# Patient Record
Sex: Female | Born: 1935 | Race: White | Hispanic: No | Marital: Married | Smoking: Former smoker
Health system: Southern US, Community
[De-identification: ages and names within clinical notes are randomized; demographics above are authoritative.]

## PROBLEM LIST (undated history)

## (undated) DIAGNOSIS — M810 Age-related osteoporosis without current pathological fracture: Secondary | ICD-10-CM

## (undated) DIAGNOSIS — F0280 Dementia in other diseases classified elsewhere without behavioral disturbance: Secondary | ICD-10-CM

## (undated) DIAGNOSIS — C189 Malignant neoplasm of colon, unspecified: Secondary | ICD-10-CM

## (undated) DIAGNOSIS — G309 Alzheimer's disease, unspecified: Secondary | ICD-10-CM

## (undated) DIAGNOSIS — E785 Hyperlipidemia, unspecified: Secondary | ICD-10-CM

## (undated) HISTORY — PX: COLON SURGERY: SHX602

## (undated) HISTORY — DX: Hyperlipidemia, unspecified: E78.5

## (undated) HISTORY — DX: Alzheimer's disease, unspecified: G30.9

## (undated) HISTORY — DX: Age-related osteoporosis without current pathological fracture: M81.0

## (undated) HISTORY — DX: Malignant neoplasm of colon, unspecified: C18.9

## (undated) HISTORY — DX: Dementia in other diseases classified elsewhere, unspecified severity, without behavioral disturbance, psychotic disturbance, mood disturbance, and anxiety: F02.80

---

## 1994-10-13 DIAGNOSIS — C189 Malignant neoplasm of colon, unspecified: Secondary | ICD-10-CM

## 1994-10-13 HISTORY — DX: Malignant neoplasm of colon, unspecified: C18.9

## 1999-09-11 ENCOUNTER — Other Ambulatory Visit: Admission: RE | Admit: 1999-09-11 | Discharge: 1999-10-02 | Payer: Self-pay | Admitting: Family Medicine

## 2000-10-26 ENCOUNTER — Ambulatory Visit (HOSPITAL_COMMUNITY): Admission: RE | Admit: 2000-10-26 | Discharge: 2000-10-26 | Payer: Self-pay | Admitting: Gastroenterology

## 2002-08-09 ENCOUNTER — Ambulatory Visit (HOSPITAL_COMMUNITY): Admission: RE | Admit: 2002-08-09 | Discharge: 2002-08-09 | Payer: Self-pay | Admitting: Gastroenterology

## 2002-08-09 ENCOUNTER — Encounter: Payer: Self-pay | Admitting: Gastroenterology

## 2004-02-27 ENCOUNTER — Ambulatory Visit (HOSPITAL_COMMUNITY): Admission: RE | Admit: 2004-02-27 | Discharge: 2004-02-27 | Payer: Self-pay | Admitting: Gastroenterology

## 2013-01-05 ENCOUNTER — Other Ambulatory Visit: Payer: Self-pay | Admitting: Nurse Practitioner

## 2013-01-18 ENCOUNTER — Other Ambulatory Visit: Payer: Self-pay | Admitting: Nurse Practitioner

## 2013-01-31 ENCOUNTER — Encounter: Payer: Self-pay | Admitting: Nurse Practitioner

## 2013-01-31 ENCOUNTER — Encounter: Payer: Self-pay | Admitting: *Deleted

## 2013-01-31 ENCOUNTER — Ambulatory Visit (INDEPENDENT_AMBULATORY_CARE_PROVIDER_SITE_OTHER): Payer: Medicare Other | Admitting: Nurse Practitioner

## 2013-01-31 VITALS — BP 123/84 | HR 74 | Temp 97.0°F | Ht 61.0 in | Wt 96.0 lb

## 2013-01-31 DIAGNOSIS — G309 Alzheimer's disease, unspecified: Secondary | ICD-10-CM

## 2013-01-31 DIAGNOSIS — F0280 Dementia in other diseases classified elsewhere without behavioral disturbance: Secondary | ICD-10-CM

## 2013-01-31 DIAGNOSIS — E785 Hyperlipidemia, unspecified: Secondary | ICD-10-CM

## 2013-01-31 DIAGNOSIS — M810 Age-related osteoporosis without current pathological fracture: Secondary | ICD-10-CM

## 2013-01-31 LAB — COMPLETE METABOLIC PANEL WITH GFR
ALT: 22 U/L (ref 0–35)
AST: 32 U/L (ref 0–37)
Albumin: 3.7 g/dL (ref 3.5–5.2)
Alkaline Phosphatase: 57 U/L (ref 39–117)
BUN: 23 mg/dL (ref 6–23)
CO2: 27 mEq/L (ref 19–32)
Calcium: 10.2 mg/dL (ref 8.4–10.5)
Chloride: 107 mEq/L (ref 96–112)
Creat: 1.13 mg/dL — ABNORMAL HIGH (ref 0.50–1.10)
GFR, Est African American: 53 mL/min — ABNORMAL LOW
GFR, Est Non African American: 46 mL/min — ABNORMAL LOW
Glucose, Bld: 93 mg/dL (ref 70–99)
Potassium: 4.4 mEq/L (ref 3.5–5.3)
Sodium: 140 mEq/L (ref 135–145)
Total Bilirubin: 0.5 mg/dL (ref 0.3–1.2)
Total Protein: 6.4 g/dL (ref 6.0–8.3)

## 2013-01-31 NOTE — Progress Notes (Signed)
  Subjective:    Patient ID: Kathryn Hamilton, female    DOB: 1931/02/25, 79 y.o.   MRN: [id number]  Hyperlipidemia This is a chronic problem. The current episode started more than 1 year ago. The problem is controlled. Recent lipid tests were reviewed and are normal. There are no known factors aggravating her hyperlipidemia. Pertinent negatives include no focal sensory loss, focal weakness, leg pain or myalgias. Current antihyperlipidemic treatment includes statins. The current treatment provides significant improvement of lipids. There are no compliance problems.  Risk factors for coronary artery disease include post-menopausal.  Alzhiemers Patient seems to be worsening, but husband is able to deal with her well. He stays with her 24/7.Namenda helps Osteoporosis Calcium and vit d oding well no c/o back pain    Review of Systems  Musculoskeletal: Negative for myalgias.  Neurological: Negative for focal weakness.  All other systems reviewed and are negative.       Objective:   Physical Exam  Constitutional: She is oriented to person, place, and time. She appears well-developed and well-nourished.  HENT:  Nose: Nose normal.  Mouth/Throat: Oropharynx is clear and moist.  Eyes: EOM are normal.  Neck: Trachea normal, normal range of motion and full passive range of motion without pain. Neck supple. No JVD present. Carotid bruit is not present. No thyromegaly present.  Cardiovascular: Normal rate, regular rhythm, normal heart sounds and intact distal pulses.  Exam reveals no gallop and no friction rub.   No murmur heard. Pulmonary/Chest: Effort normal and breath sounds normal.  Abdominal: Soft. Bowel sounds are normal. She exhibits no distension and no mass. There is no tenderness.  Musculoskeletal: Normal range of motion.  Lymphadenopathy:    She has no cervical adenopathy.  Neurological: She is alert and oriented to person, place, and time. She has normal reflexes.  Skin: Skin is  warm and dry.  Psychiatric: She has a normal mood and affect. Her behavior is normal. Judgment and thought content normal.    BP 123/84  Pulse 74  Temp(Src) 97 F (36.1 C) (Oral)  Ht 5\' 1"  (1.549 m)  Wt 96 lb (43.545 kg)  BMI 18.15 kg/m2       Assessment & Plan:  1. Hyperlipidemia with target LDL less than 100 Low fat diet and exercise - COMPLETE METABOLIC PANEL WITH GFR - NMR Lipoprofile with Lipids  2. Alzheimer's disease Continue namenda   3. Osteoporosis, unspecified Weight bearing exercises refeuses dexa scan  Contiue all meds F/U in 3 months  Bennie Pierini, FNP

## 2013-01-31 NOTE — Patient Instructions (Signed)
Alzheimer's Disease Caregiver Guide Alzheimer's disease is an illness that affects a person's brain. It causes a person to lose the ability to remember things and make good decisions. As the disease progresses, the person is unable to take care of himself or herself and needs more and more help to do simple tasks. Taking care of someone with Alzheimer's disease can be very challenging and overwhelming.  MEMORY LOSS AND CONFUSION Memory loss and confusion is mild in the beginning stages of the disease. Both of these problems become more severe as the disease progresses. Eventually, the person will not recognize places or even close family members and friends.   Stay calm.  Respond with a short explanation. Long explanations can be overwhelming and confusing.  Avoid corrections that sound like scolding.  Try not to take it personally, even if the person forgets your name. BEHAVIOR CHANGES Behavior changes are part of the disease. The person may develop depression, anxiety, anger, hallucinations, or other behavior changes. These changes can come on suddenly and may be in response to pain, infection, changes in the environment (temperature, noise), overstimulation, or feeling lost or scared.   Try not to take behavior changes personally.  Remain calm and patient.  Do not argue or try to convince the person about a specific point. This will only make him or her more agitated.  Know that the behavior changes are part of the disease process and try to work through it. TIPS TO REDUCE FRUSTRATION  Schedule wisely by making appointments and doing daily tasks, like bathing and dressing, when the person is at his or her best.  Take your time. Simple tasks may take a lot longer, so be sure to allow for plenty of time.  Limit choices. Too many choices can be overwhelming and stressful for the person.  Involve the person in what you are doing.  Stick to a routine.  Avoid new or crowded  situations, if possible.  Use simple words, short sentences, and a calm voice. Only give 1 direction at a time.  Buy clothes and shoes that are easy to put on and take off.  Let people help if they offer. HOME SAFETY Keeping the home safe is very important to reduce the risk of falls and injuries.   Keep floors clear of clutter. Remove rugs, magazine racks, and floor lamps.  Keep hallways well lit.  Put a handrail and nonslip mat in the bathtub or shower.  Put childproof locks on cabinets with dangerous items, such as medicine, alcohol, guns, toxic cleaning items, sharp tools or utensils, matches, or lighters.  Place locks on doors where the person cannot easily see or reach them. This helps ensure that the person cannot wander out of the house and get lost.  Be prepared for emergencies. Keep a list of emergency phone numbers and addresses in a convenient area. PLANS FOR THE FUTURE  Do not put off talking about finances.  Talk about money management. People with Alzheimer's disease have trouble managing their money as the disease gets worse.  Get help from professional advisors regarding financial and legal matters.  Do not put off talking about future care.  Choose a power of attorney. This is someone who can make decisions for the person with Alzheimer's disease when he or she is no longer able to do so.  Talk about driving and when it is the right time to stop. The person's doctor can help give advice on this matter.  Talk about the person's   living situation. If he or she lives alone, you need to make sure he or she is safe. Some people need extra help at home, and others need more care at a nursing home or care center. SUPPORT GROUPS Joining a support group can be very helpful for caregivers of people with Alzheimer's disease. Some advantages to being part of a support group include:   Getting strategies to manage stress.  Sharing experiences with others.  Receiving  emotional comfort and support.  Learning new caregiving skills as the disease progresses.  Knowing what community resources are available and taking advantage of them. SEEK MEDICAL CARE IF:  The person has a fever.  The person has a sudden change in behavior that does not improve with calming strategies.  The person is unable to manage in his or her current living situation.  The person threatens you or anyone else, including himself or herself.  You are no longer able to care for the person. Document Released: 06/10/2004 Document Revised: 03/30/2012 Document Reviewed: 11/05/2011 [facility] Patient Information 2013 [place], [place].

## 2013-02-01 LAB — NMR LIPOPROFILE WITH LIPIDS
Cholesterol, Total: 159 mg/dL (ref <200)
HDL Particle Number: 35.5 umol/L (ref >=30.5)
HDL Size: 10.7 nm (ref >=9.2)
HDL-C: 75 mg/dL (ref >=40)
LDL (calc): 74 mg/dL (ref <100)
LDL Particle Number: 439 nmol/L (ref <1000)
LDL Size: 21.1 nm (ref >20.5)
LP-IR Score: <25 (ref <=45)
Large HDL-P: 17.7 umol/L (ref >=4.8)
Large VLDL-P: 1 nmol/L (ref <=2.7)
Small LDL Particle Number: <90 nmol/L (ref <=527)
Triglycerides: 49 mg/dL (ref <150)
VLDL Size: 49 nm — ABNORMAL HIGH (ref <=46.6)

## 2013-02-02 ENCOUNTER — Other Ambulatory Visit: Payer: Self-pay | Admitting: Nurse Practitioner

## 2013-02-02 NOTE — Telephone Encounter (Signed)
Last labs 4/13. ntbs

## 2013-02-05 ENCOUNTER — Other Ambulatory Visit: Payer: Self-pay | Admitting: Nurse Practitioner

## 2013-02-07 NOTE — Telephone Encounter (Signed)
LAST LABS 4/13. LAST LABS 10/13

## 2013-02-09 ENCOUNTER — Other Ambulatory Visit: Payer: Self-pay | Admitting: Nurse Practitioner

## 2013-02-10 NOTE — Telephone Encounter (Signed)
LAT OV 10/13

## 2013-03-02 ENCOUNTER — Other Ambulatory Visit: Payer: Self-pay | Admitting: Nurse Practitioner

## 2013-04-19 ENCOUNTER — Other Ambulatory Visit: Payer: Self-pay | Admitting: Nurse Practitioner

## 2013-05-12 ENCOUNTER — Other Ambulatory Visit: Payer: Self-pay | Admitting: Nurse Practitioner

## 2013-06-02 ENCOUNTER — Other Ambulatory Visit: Payer: Self-pay | Admitting: Nurse Practitioner

## 2013-06-14 ENCOUNTER — Other Ambulatory Visit: Payer: Self-pay | Admitting: Nurse Practitioner

## 2013-08-01 ENCOUNTER — Ambulatory Visit (INDEPENDENT_AMBULATORY_CARE_PROVIDER_SITE_OTHER): Payer: Medicare Other

## 2013-08-01 DIAGNOSIS — Z23 Encounter for immunization: Secondary | ICD-10-CM

## 2013-08-02 ENCOUNTER — Other Ambulatory Visit: Payer: Self-pay | Admitting: Family Medicine

## 2013-08-10 ENCOUNTER — Encounter: Payer: Self-pay | Admitting: Nurse Practitioner

## 2013-08-10 ENCOUNTER — Encounter (INDEPENDENT_AMBULATORY_CARE_PROVIDER_SITE_OTHER): Payer: Self-pay

## 2013-08-10 ENCOUNTER — Ambulatory Visit (INDEPENDENT_AMBULATORY_CARE_PROVIDER_SITE_OTHER): Payer: Medicare Other | Admitting: Nurse Practitioner

## 2013-08-10 VITALS — BP 110/55 | HR 91 | Temp 97.0°F | Ht 60.0 in | Wt 98.0 lb

## 2013-08-10 DIAGNOSIS — F0280 Dementia in other diseases classified elsewhere without behavioral disturbance: Secondary | ICD-10-CM

## 2013-08-10 DIAGNOSIS — M810 Age-related osteoporosis without current pathological fracture: Secondary | ICD-10-CM

## 2013-08-10 DIAGNOSIS — E785 Hyperlipidemia, unspecified: Secondary | ICD-10-CM

## 2013-08-10 NOTE — Progress Notes (Signed)
  Subjective:    Patient ID: Kathryn Hamilton, female    DOB: December 20, 1930, 79 y.o.   MRN: [id number]  Hyperlipidemia This is a chronic problem. The current episode started more than 1 year ago. The problem is controlled. Recent lipid tests were reviewed and are normal. There are no known factors aggravating her hyperlipidemia. Pertinent negatives include no focal sensory loss, focal weakness, leg pain or myalgias. Current antihyperlipidemic treatment includes statins. The current treatment provides significant improvement of lipids. There are no compliance problems.  Risk factors for coronary artery disease include post-menopausal.  Alzhiemers Patient seems to be worsening, but husband is able to deal with her well. He stays with her 24/7.Namenda helps Osteoporosis Calcium and vit d oding well no c/o back pain    Review of Systems  Musculoskeletal: Negative for myalgias.  Neurological: Negative for focal weakness.  All other systems reviewed and are negative.       Objective:   Physical Exam  Constitutional: She is oriented to person, place, and time. She appears well-developed and well-nourished.  HENT:  Nose: Nose normal.  Mouth/Throat: Oropharynx is clear and moist.  Eyes: EOM are normal.  Neck: Trachea normal, normal range of motion and full passive range of motion without pain. Neck supple. No JVD present. Carotid bruit is not present. No thyromegaly present.  Cardiovascular: Normal rate, regular rhythm, normal heart sounds and intact distal pulses.  Exam reveals no gallop and no friction rub.   No murmur heard. Pulmonary/Chest: Effort normal and breath sounds normal.  Abdominal: Soft. Bowel sounds are normal. She exhibits no distension and no mass. There is no tenderness.  Musculoskeletal: Normal range of motion.  Lymphadenopathy:    She has no cervical adenopathy.  Neurological: She is alert and oriented to person, place, and time. She has normal reflexes.  Skin: Skin is  warm and dry.  Psychiatric: She has a normal mood and affect. Her behavior is normal. Judgment and thought content normal.    BP 110/55  Pulse 91  Temp(Src) 97 F (36.1 C) (Oral)  Ht 5' (1.524 m)  Wt 98 lb (44.453 kg)  BMI 19.14 kg/m2      Assessment & Plan:   1. Hyperlipidemia with target LDL less than 100   2. Osteoporosis, unspecified   3. Alzheimer's disease    Orders Placed This Encounter  Procedures  . CMP14+EGFR  . NMR, lipoprofile    Continue all meds Labs pending Diet and exercise encouraged Health maintenance reviewed Follow up in 3 months  Mary-Margaret Daphine Deutscher, FNP

## 2013-08-10 NOTE — Patient Instructions (Signed)

## 2013-08-12 LAB — CMP14+EGFR
ALT: 19 IU/L (ref 0–32)
Albumin/Globulin Ratio: 1.7 (ref 1.1–2.5)
Albumin: 3.8 g/dL (ref 3.5–4.8)
BUN: 24 mg/dL (ref 8–27)
Calcium: 10.3 mg/dL — ABNORMAL HIGH (ref 8.6–10.2)
Creatinine, Ser: 1.22 mg/dL — ABNORMAL HIGH (ref 0.57–1.00)
GFR calc Af Amer: 49 mL/min/{1.73_m2} — ABNORMAL LOW (ref >59)
GFR calc non Af Amer: 42 mL/min/{1.73_m2} — ABNORMAL LOW (ref >59)
Glucose: 86 mg/dL (ref 65–99)
Potassium: 5.2 mmol/L (ref 3.5–5.2)
Total Bilirubin: 0.5 mg/dL (ref 0.0–1.2)
Total Protein: 6.1 g/dL (ref 6.0–8.5)

## 2013-08-12 LAB — NMR, LIPOPROFILE
Cholesterol: 145 mg/dL (ref <200)
HDL Cholesterol by NMR: 78 mg/dL (ref >=40)
LDL Size: 21.2 nm (ref >20.5)
Small LDL Particle Number: <90 nmol/L (ref <=527)
Triglycerides by NMR: 67 mg/dL (ref <150)

## 2013-08-15 ENCOUNTER — Other Ambulatory Visit: Payer: Self-pay | Admitting: Nurse Practitioner

## 2013-11-02 ENCOUNTER — Other Ambulatory Visit: Payer: Self-pay | Admitting: Nurse Practitioner

## 2013-11-13 ENCOUNTER — Emergency Department (HOSPITAL_COMMUNITY)
Admission: EM | Admit: 2013-11-13 | Discharge: 2013-11-13 | Disposition: A | Discharge status: Home / Self Care | Payer: Medicare Other | Attending: Emergency Medicine | Admitting: Emergency Medicine

## 2013-11-13 ENCOUNTER — Encounter (HOSPITAL_COMMUNITY): Payer: Self-pay | Admitting: Emergency Medicine

## 2013-11-13 ENCOUNTER — Emergency Department (HOSPITAL_COMMUNITY): Payer: Medicare Other

## 2013-11-13 DIAGNOSIS — Z87891 Personal history of nicotine dependence: Secondary | ICD-10-CM | POA: Insufficient documentation

## 2013-11-13 DIAGNOSIS — Y929 Unspecified place or not applicable: Secondary | ICD-10-CM | POA: Insufficient documentation

## 2013-11-13 DIAGNOSIS — E785 Hyperlipidemia, unspecified: Secondary | ICD-10-CM | POA: Insufficient documentation

## 2013-11-13 DIAGNOSIS — F0280 Dementia in other diseases classified elsewhere without behavioral disturbance: Secondary | ICD-10-CM | POA: Insufficient documentation

## 2013-11-13 DIAGNOSIS — G309 Alzheimer's disease, unspecified: Secondary | ICD-10-CM | POA: Insufficient documentation

## 2013-11-13 DIAGNOSIS — Z79899 Other long term (current) drug therapy: Secondary | ICD-10-CM | POA: Insufficient documentation

## 2013-11-13 DIAGNOSIS — S0101XA Laceration without foreign body of scalp, initial encounter: Secondary | ICD-10-CM

## 2013-11-13 DIAGNOSIS — Z8739 Personal history of other diseases of the musculoskeletal system and connective tissue: Secondary | ICD-10-CM | POA: Insufficient documentation

## 2013-11-13 DIAGNOSIS — R296 Repeated falls: Secondary | ICD-10-CM | POA: Insufficient documentation

## 2013-11-13 DIAGNOSIS — Z7982 Long term (current) use of aspirin: Secondary | ICD-10-CM | POA: Insufficient documentation

## 2013-11-13 DIAGNOSIS — S0100XA Unspecified open wound of scalp, initial encounter: Secondary | ICD-10-CM | POA: Insufficient documentation

## 2013-11-13 DIAGNOSIS — Y939 Activity, unspecified: Secondary | ICD-10-CM | POA: Insufficient documentation

## 2013-11-13 DIAGNOSIS — Z85038 Personal history of other malignant neoplasm of large intestine: Secondary | ICD-10-CM | POA: Insufficient documentation

## 2013-11-13 DIAGNOSIS — W19XXXA Unspecified fall, initial encounter: Secondary | ICD-10-CM

## 2013-11-13 MED ORDER — BACITRACIN ZINC 500 UNIT/GM EX OINT
TOPICAL_OINTMENT | CUTANEOUS | Status: AC
  Administered 2013-11-13: 1 via TOPICAL
  Filled 2013-11-13: qty 0.9

## 2013-11-13 MED ORDER — BACITRACIN ZINC 500 UNIT/GM EX OINT
TOPICAL_OINTMENT | Freq: Once | CUTANEOUS | Status: AC
  Administered 2013-11-13: 1 via TOPICAL

## 2013-11-13 NOTE — ED Provider Notes (Signed)
CSN: [id number]     Arrival date & time 11/13/13  0909 History   This chart was scribed for Carmin Muskrat, MD, by Neta Ehlers, ED Scribe. This patient was seen in room APA18/APA18 and the patient's care was started at 9:21 AM. First MD Initiated Contact with Patient 11/13/13 [phone]     Chief Complaint  Patient presents with  . Fall   Level Five Caveat: Alzheimer's Disease  The history is provided by the patient, the EMS personnel, the spouse and a relative. The history is limited by the condition of the patient. No language interpreter was used.   HPI Comments: Kathryn MALLICOAT is a 80 y.o. female, with a h/o osteoporosis and Alzheimer's disease, who presents to the Emergency Department via EMS complaining of a fall which occurred three hours ago. The pt has a laceration to the back of her head; the bleeding is currently controlled. She could not recall how she fell, and she denies any current pain. The pt's son reports his mother did not have a LOC and was ambulatory post fall. The pt takes ASA daily. Ms. Dorval is a former smoker.   Past Medical History  Diagnosis Date  . Colon cancer   . Osteoporosis   . Alzheimer disease   . HLD (hyperlipidemia)    Past Surgical History  Procedure Laterality Date  . Colon surgery     Family History  Problem Relation Age of Onset  . Heart disease Father    History  Substance Use Topics  . Smoking status: Former Smoker    Quit date: 01/31/1973  . Smokeless tobacco: Not on file  . Alcohol Use: No   No OB history provided.   Review of Systems  Unable to perform ROS: Dementia    Allergies  Review of patient's allergies indicates no known allergies.  Home Medications   Current Outpatient Rx  Name  Route  Sig  Dispense  Refill  . aspirin 81 MG tablet   Oral   Take 81 mg by mouth daily.         Marland Kitchen atorvastatin (LIPITOR) 40 MG tablet      TAKE 1 TABLET DAILY   30 tablet   5   . Calcium Carbonate (CALCIUM 600 PO)   Oral  Take by mouth.         . Cholecalciferol (VITAMIN D-3) 1000 UNITS CAPS   Oral   Take by mouth.         Marland Kitchen NAMENDA 10 MG tablet      TAKE (1) TABLET TWICE A DAY.   60 tablet   0   . niacin (NIASPAN) 500 MG CR tablet      TAKE ONE TABLET AT BEDTIME   30 tablet   5    Triage Vitals: BP 133/75  Pulse 95  Temp(Src) 97.7 F (36.5 C) (Oral)  Resp 16  Ht 5\' 3"  (1.6 m)  Wt 98 lb (44.453 kg)  BMI 17.36 kg/m2  SpO2 100%  Physical Exam  Nursing note and vitals reviewed. Constitutional: She is oriented to person, place, and time. She appears well-developed and well-nourished. No distress.  HENT:  Head: Normocephalic and atraumatic.  Eyes: Conjunctivae and EOM are normal.  Cardiovascular: Normal rate and regular rhythm.   Pulmonary/Chest: Effort normal and breath sounds normal. No stridor. No respiratory distress.  Abdominal: She exhibits no distension.  Musculoskeletal: She exhibits no edema.  Neurological: She is alert and oriented to person, place, and time. No cranial  nerve deficit.  Good nervous system tone.   Skin: Skin is warm and dry.  6 cm laceration to right center occiput.   Psychiatric: She has a normal mood and affect.    ED Course  LACERATION REPAIR Date/Time: 11/13/2013 10:24 AM Performed by: Carmin Muskrat Authorized by: Carmin Muskrat Consent: Verbal consent obtained. The procedure was performed in an emergent situation. Risks and benefits: risks, benefits and alternatives were discussed Consent given by: patient and family. Required items: required blood products, implants, devices, and special equipment available Patient identity confirmed: verbally with patient and hospital-assigned identification number Time out: Immediately prior to procedure a "time out" was called to verify the correct patient, procedure, equipment, support staff and site/side marked as required. Body area: head/neck Location details: scalp Laceration length: 6 cm Tendon  involvement: none Nerve involvement: none Vascular damage: no Irrigation solution: saline Amount of cleaning: standard Debridement: none Degree of undermining: none Skin closure: staples Number of sutures: 5 Approximation: close Approximation difficulty: simple Dressing: antibiotic ointment Patient tolerance: Patient tolerated the procedure well with no immediate complications.   (including critical care time)   COORDINATION OF CARE:  9:29 AM- Discussed treatment plan with patient, which includes a CT scan and staples, and the patient agreed to the plan.   10:06 AM- Rechecked pt. Placed staples.    Labs Review Labs Reviewed - No data to display Imaging Review No results found.  EKG Interpretation   None       MDM   I personally performed the services described in this documentation, which was scribed in my presence. The recorded information has been reviewed and is accurate.   This patient with dementia presents after a fall with head laceration.  Given the patient's cognitive issues, the unknown aspect of the fall, patient had CT scans performed.  These were reassuring.  Patient was hemodynamically throughout her ED course, had laceration repair as above, and was appropriate for discharge with outpatient followup.   Carmin Muskrat, MD 11/13/13 1026

## 2013-11-13 NOTE — ED Notes (Signed)
Pt removed from [place] by EDP. Pt still has c-collar on.

## 2013-11-13 NOTE — ED Notes (Signed)
Per EMS, pt fell from standing position this am. Pt has history of dementia. EMS reports has moderate laceration noted to right posterior side of head. EMS reports bleeding controlled on scene. No active bleeding noted at time of arrival to ED. Pt fully immobilized and on LSB. nad noted.

## 2013-11-13 NOTE — ED Notes (Signed)
Site cleaned with sterile water. EDP placed staples after site cleaning. Bacitracin ointment applied to site by EDP. Pt tolerated well.

## 2013-11-21 ENCOUNTER — Ambulatory Visit (INDEPENDENT_AMBULATORY_CARE_PROVIDER_SITE_OTHER): Payer: Medicare Other | Admitting: Family Medicine

## 2013-11-21 ENCOUNTER — Encounter: Payer: Self-pay | Admitting: Family Medicine

## 2013-11-21 VITALS — BP 138/58 | HR 95 | Temp 97.0°F | Wt 102.8 lb

## 2013-11-21 DIAGNOSIS — S0100XA Unspecified open wound of scalp, initial encounter: Secondary | ICD-10-CM

## 2013-11-21 DIAGNOSIS — S0101XA Laceration without foreign body of scalp, initial encounter: Secondary | ICD-10-CM

## 2013-11-21 NOTE — Progress Notes (Signed)
   Subjective:    Patient ID: Kathryn Hamilton, female    DOB: [birth year], 80 y.o.   MRN: [id number]  HPI  This 80 y.o. female presents for evaluation of laceration to scalp which happened over a week ago and she needs staples to be removed.  Review of Systems No chest pain, SOB, HA, dizziness, vision change, N/V, diarrhea, constipation, dysuria, urinary urgency or frequency, myalgias, arthralgias or rash.     Objective:   Physical Exam  Vital signs noted  Well developed well nourished female.  HEENT - Head with laceration to right parietal region which are well approximated with staples.      Assessment & Plan:  Laceration of scalp Remove staples and may apply neosporin ointment to sight.  Lysbeth Penner FNP

## 2013-12-02 ENCOUNTER — Other Ambulatory Visit: Payer: Self-pay | Admitting: Nurse Practitioner

## 2013-12-21 ENCOUNTER — Ambulatory Visit (INDEPENDENT_AMBULATORY_CARE_PROVIDER_SITE_OTHER): Payer: Medicare Other | Admitting: Nurse Practitioner

## 2013-12-21 ENCOUNTER — Encounter: Payer: Self-pay | Admitting: Nurse Practitioner

## 2013-12-21 VITALS — BP 126/70 | HR 99 | Temp 97.1°F | Ht 63.0 in | Wt 103.0 lb

## 2013-12-21 DIAGNOSIS — R7989 Other specified abnormal findings of blood chemistry: Secondary | ICD-10-CM

## 2013-12-21 DIAGNOSIS — E785 Hyperlipidemia, unspecified: Secondary | ICD-10-CM

## 2013-12-21 DIAGNOSIS — R7309 Other abnormal glucose: Secondary | ICD-10-CM

## 2013-12-21 NOTE — Patient Instructions (Signed)

## 2013-12-21 NOTE — Progress Notes (Addendum)
  Subjective:    Patient ID: Kathryn Hamilton, female    DOB: [birth year], 80 y.o.   MRN: [id number]  Hyperlipidemia This is a chronic problem. The current episode started more than 1 year ago. The problem is controlled. Recent lipid tests were reviewed and are normal. There are no known factors aggravating her hyperlipidemia. Pertinent negatives include no chest pain, focal sensory loss, focal weakness, leg pain, myalgias or shortness of breath. Current antihyperlipidemic treatment includes statins. The current treatment provides significant improvement of lipids. There are no compliance problems.  Risk factors for coronary artery disease include post-menopausal.  Patient does not exercise.  Alzhiemers Patient seems to be worsening, but husband is able to deal with her well with help from caregiver. Caregiver is present every day except Saturday and Sunday. Requires assistance with dressing for past year. He stays with her 24/7. Husband assumes Namenda helps.  Osteoporosis Calcium and vit d oding well no c/o back pain  Patient's husband states she is eating and drinking well. 3 meals a day with some snacks.   Review of Systems  Respiratory: Negative for shortness of breath.   Cardiovascular: Negative for chest pain.  Musculoskeletal: Negative for arthralgias, back pain and myalgias.  Neurological: Negative for focal weakness.  All other systems reviewed and are negative.       Objective:   Physical Exam  Constitutional: She is oriented to person, place, and time. She appears well-developed and well-nourished.  HENT:  Left Ear: External ear normal.  Nose: Nose normal.  Mouth/Throat: Oropharynx is clear and moist.  R. Cerumen Impaction  Eyes: EOM are normal.  Neck: Trachea normal, normal range of motion and full passive range of motion without pain. Neck supple. No JVD present. Carotid bruit is not present. No thyromegaly present.  Cardiovascular: Normal rate, regular rhythm, normal  heart sounds and intact distal pulses.  Exam reveals no gallop and no friction rub.   No murmur heard. Pulmonary/Chest: Effort normal and breath sounds normal.  Abdominal: Soft. Bowel sounds are normal. She exhibits no distension and no mass. There is no tenderness.  Musculoskeletal: Normal range of motion.  Lymphadenopathy:    She has no cervical adenopathy.  Neurological: She is alert and oriented to person, place, and time. She has normal reflexes.  Skin: Skin is warm and dry.  Psychiatric: She has a normal mood and affect. Her behavior is normal. Judgment and thought content normal.  BP 126/70  Pulse 99  Temp(Src) 97.1 F (36.2 C) (Oral)  Ht $R'5\' 3"'kd$  (1.6 m)  Wt 103 lb (46.72 kg)  BMI 18.25 kg/m2       Assessment & Plan:   1. Hyperlipidemia with target LDL less than 100    Orders Placed This Encounter  Procedures  . CMP14+EGFR  . NMR, lipoprofile    Continue all meds Labs pending Diet and exercise encouraged Health maintenance reviewed Follow up in 3 months  [place], FNP

## 2013-12-23 LAB — CMP14+EGFR
A/G RATIO: 1.7 (ref 1.1–2.5)
ALBUMIN: 3.7 g/dL (ref 3.5–4.7)
ALT: 14 IU/L (ref 0–32)
AST: 24 IU/L (ref 0–40)
Alkaline Phosphatase: 71 IU/L (ref 39–117)
BUN/Creatinine Ratio: 19 (ref 11–26)
BUN: 25 mg/dL (ref 8–27)
CO2: 22 mmol/L (ref 18–29)
Calcium: 9.7 mg/dL (ref 8.7–10.3)
Chloride: 109 mmol/L — ABNORMAL HIGH (ref 97–108)
Creatinine, Ser: 1.29 mg/dL — ABNORMAL HIGH (ref 0.57–1.00)
GFR calc Af Amer: 45 mL/min/{1.73_m2} — ABNORMAL LOW (ref >59)
GFR, EST NON AFRICAN AMERICAN: 39 mL/min/{1.73_m2} — AB (ref >59)
GLOBULIN, TOTAL: 2.2 g/dL (ref 1.5–4.5)
Glucose: 152 mg/dL — ABNORMAL HIGH (ref 65–99)
Potassium: 3.8 mmol/L (ref 3.5–5.2)
Sodium: 147 mmol/L — ABNORMAL HIGH (ref 134–144)
TOTAL PROTEIN: 5.9 g/dL — AB (ref 6.0–8.5)
Total Bilirubin: 0.4 mg/dL (ref 0.0–1.2)

## 2013-12-23 LAB — NMR, LIPOPROFILE
Cholesterol: 137 mg/dL (ref <200)
HDL CHOLESTEROL BY NMR: 75 mg/dL (ref >=40)
HDL PARTICLE NUMBER: 31.4 umol/L (ref >=30.5)
LDL Particle Number: 359 nmol/L (ref <1000)
LDL Size: 21.5 nm (ref >20.5)
LDLC SERPL CALC-MCNC: 48 mg/dL (ref <100)
LP-IR Score: <25 (ref <=45)
TRIGLYCERIDES BY NMR: 71 mg/dL (ref <150)

## 2014-01-09 LAB — POCT GLYCOSYLATED HEMOGLOBIN (HGB A1C): Hemoglobin A1C: 5.2

## 2014-01-09 NOTE — Addendum Note (Signed)
Addended by: Pollyann Kennedy F on: 01/09/2014 04:50 PM   Modules accepted: Orders

## 2014-01-23 ENCOUNTER — Telehealth: Payer: Self-pay | Admitting: Nurse Practitioner

## 2014-01-23 NOTE — Telephone Encounter (Signed)
appt at 11:00 with Ronnald Collum

## 2014-01-24 ENCOUNTER — Ambulatory Visit (INDEPENDENT_AMBULATORY_CARE_PROVIDER_SITE_OTHER): Payer: Medicare Other | Admitting: Nurse Practitioner

## 2014-01-24 ENCOUNTER — Encounter: Payer: Self-pay | Admitting: Nurse Practitioner

## 2014-01-24 VITALS — BP 145/67 | HR 92 | Temp 97.9°F | Ht 63.0 in | Wt 100.0 lb

## 2014-01-24 DIAGNOSIS — M67439 Ganglion, unspecified wrist: Secondary | ICD-10-CM

## 2014-01-24 DIAGNOSIS — M6740 Ganglion, unspecified site: Secondary | ICD-10-CM

## 2014-01-24 DIAGNOSIS — H8109 Meniere's disease, unspecified ear: Secondary | ICD-10-CM

## 2014-01-24 DIAGNOSIS — H81399 Other peripheral vertigo, unspecified ear: Secondary | ICD-10-CM

## 2014-01-24 NOTE — Progress Notes (Signed)
   Subjective:    Patient ID: Kathryn Hamilton, female    DOB: [birth year], 80 y.o.   MRN: [id number]  HPI Patient brought in by husband with a knot on her right hand- noticed it about 1 week ago- no better. - Caregiver says that she has been a little dizzy last week and when she walked she would walk to the left. Not doing it now.    Review of Systems  Respiratory: Negative.   Cardiovascular: Negative.   Neurological: Negative.   All other systems reviewed and are negative.      Objective:   Physical Exam  Constitutional: She is oriented to person, place, and time. She appears well-developed and well-nourished.  Cardiovascular: Normal rate, regular rhythm and normal heart sounds.   Pulmonary/Chest: Effort normal and breath sounds normal.  Musculoskeletal:  Soft nodule at right wrist  Neurological: She is alert and oriented to person, place, and time.  Skin: Skin is warm.   BP 145/67  Pulse 92  Temp(Src) 97.9 F (36.6 C) (Oral)  Ht 5\' 3"  (1.6 m)  Wt 100 lb (45.36 kg)  BMI 17.72 kg/m2        Assessment & Plan:   1. Ganglion cyst of wrist Will watch for now If causes pain will do something about it  2. Labyrinthine vertigo Dramamine OTC if reoccurs RTO prn  Mary-Margaret Hassell Done, FNP

## 2014-01-24 NOTE — Patient Instructions (Signed)
Ganglion Cyst °A ganglion cyst is a noncancerous, fluid-filled lump that occurs near joints or tendons. The ganglion cyst grows out of a joint or the lining of a tendon. It most often develops in the hand or wrist but can also develop in the shoulder, elbow, hip, knee, ankle, or foot. The round or oval ganglion can be pea sized or larger than a grape. Increased activity may enlarge the size of the cyst because more fluid starts to build up.  °CAUSES  °It is not completely known what causes a ganglion cyst to grow. However, it may be related to: °· Inflammation or irritation around the joint. °· An injury. °· Repetitive movements or overuse. °· Arthritis. °SYMPTOMS  °A lump most often appears in the hand or wrist, but can occur in other areas of the body. Generally, the lump is painless without other symptoms. However, sometimes pain can be felt during activity or when pressure is applied to the lump. The lump may even be tender to the touch. Tingling, pain, numbness, or muscle weakness can occur if the ganglion cyst presses on a nerve. Your grip may be weak and you may have less movement in your joints.  °DIAGNOSIS  °Ganglion cysts are most often diagnosed based on a physical exam, noting where the cyst is and how it looks. Your caregiver will feel the lump and may shine a light alongside it. If it is a ganglion, a light often shines through it. Your caregiver may order an X-ray, ultrasound, or MRI to rule out other conditions. °TREATMENT  °Ganglions usually go away on their own without treatment. If pain or other symptoms are involved, treatment may be needed. Treatment is also needed if the ganglion limits your movement or if it gets infected. Treatment options include: °· Wearing a wrist or finger brace or splint. °· Taking anti-inflammatory medicine. °· Draining fluid from the lump with a needle (aspiration). °· Injecting a steroid into the joint. °· Surgery to remove the ganglion cyst and its stalk that is  attached to the joint or tendon. However, ganglion cysts can grow back. °HOME CARE INSTRUCTIONS  °· Do not press on the ganglion, poke it with a needle, or hit it with a heavy object. You may rub the lump gently and often. Sometimes fluid moves out of the cyst. °· Only take medicines as directed by your caregiver. °· Wear your brace or splint as directed by your caregiver. °SEEK MEDICAL CARE IF:  °· Your ganglion becomes larger or more painful. °· You have increased redness, red streaks, or swelling. °· You have pus coming from the lump. °· You have weakness or numbness in the affected area. °MAKE SURE YOU:  °· Understand these instructions. °· Will watch your condition. °· Will get help right away if you are not doing well or get worse. °Document Released: 09/26/2000 Document Revised: 06/23/2012 Document Reviewed: 11/23/2007 °[employer]® Patient Information ©2014 [employer]. ° °

## 2014-01-30 ENCOUNTER — Other Ambulatory Visit: Payer: Self-pay | Admitting: Nurse Practitioner

## 2014-02-03 ENCOUNTER — Other Ambulatory Visit: Payer: Self-pay | Admitting: Nurse Practitioner

## 2014-05-27 ENCOUNTER — Other Ambulatory Visit: Payer: Self-pay | Admitting: Nurse Practitioner

## 2014-06-26 ENCOUNTER — Other Ambulatory Visit: Payer: Self-pay | Admitting: Family Medicine

## 2014-07-03 ENCOUNTER — Other Ambulatory Visit: Payer: Self-pay | Admitting: Family Medicine

## 2014-07-04 NOTE — Telephone Encounter (Signed)
Patient NTBS for follow up and lab work  

## 2014-07-04 NOTE — Telephone Encounter (Signed)
Last lipids 3/15. 

## 2014-07-20 ENCOUNTER — Ambulatory Visit (INDEPENDENT_AMBULATORY_CARE_PROVIDER_SITE_OTHER): Payer: Medicare Other

## 2014-07-20 DIAGNOSIS — Z23 Encounter for immunization: Secondary | ICD-10-CM

## 2014-07-27 ENCOUNTER — Other Ambulatory Visit: Payer: Self-pay | Admitting: Family Medicine

## 2014-07-28 ENCOUNTER — Other Ambulatory Visit: Payer: Self-pay | Admitting: Nurse Practitioner

## 2014-07-28 ENCOUNTER — Other Ambulatory Visit: Payer: Self-pay | Admitting: Family Medicine

## 2014-07-31 ENCOUNTER — Telehealth: Payer: Self-pay | Admitting: Nurse Practitioner

## 2014-07-31 ENCOUNTER — Other Ambulatory Visit: Payer: Self-pay | Admitting: *Deleted

## 2014-07-31 MED ORDER — ATORVASTATIN CALCIUM 40 MG PO TABS: ORAL_TABLET | ORAL | Status: DC

## 2014-07-31 MED ORDER — MEMANTINE HCL 10 MG PO TABS: ORAL_TABLET | ORAL | Status: DC

## 2014-07-31 MED ORDER — NIACIN ER (ANTIHYPERLIPIDEMIC) 500 MG PO TBCR: EXTENDED_RELEASE_TABLET | ORAL | Status: DC

## 2014-07-31 NOTE — Telephone Encounter (Signed)
Sent meds to pharmacy . Left message on patients voicemail

## 2014-08-16 ENCOUNTER — Encounter: Payer: Self-pay | Admitting: Nurse Practitioner

## 2014-08-16 ENCOUNTER — Encounter (INDEPENDENT_AMBULATORY_CARE_PROVIDER_SITE_OTHER): Payer: Self-pay

## 2014-08-16 ENCOUNTER — Ambulatory Visit (INDEPENDENT_AMBULATORY_CARE_PROVIDER_SITE_OTHER): Payer: Medicare Other | Admitting: Nurse Practitioner

## 2014-08-16 VITALS — BP 138/67 | HR 108 | Temp 97.0°F | Ht 63.0 in | Wt 100.0 lb

## 2014-08-16 DIAGNOSIS — E785 Hyperlipidemia, unspecified: Secondary | ICD-10-CM

## 2014-08-16 DIAGNOSIS — F0280 Dementia in other diseases classified elsewhere without behavioral disturbance: Secondary | ICD-10-CM

## 2014-08-16 DIAGNOSIS — G309 Alzheimer's disease, unspecified: Secondary | ICD-10-CM

## 2014-08-16 DIAGNOSIS — M810 Age-related osteoporosis without current pathological fracture: Secondary | ICD-10-CM

## 2014-08-16 DIAGNOSIS — Z23 Encounter for immunization: Secondary | ICD-10-CM

## 2014-08-16 NOTE — Addendum Note (Signed)
Addended by: Waverly Ferrari on: 08/16/2014 03:17 PM   Modules accepted: Orders

## 2014-08-16 NOTE — Patient Instructions (Signed)

## 2014-08-16 NOTE — Progress Notes (Signed)
  Subjective:    Patient ID: Kathryn Hamilton, female    DOB: 1931-01-14, 80 y.o.   MRN: [id number]  Patient is here for chronic disease follow up. No acute complaint.   Hyperlipidemia This is a chronic problem. The current episode started more than 1 year ago. The problem is controlled. Recent lipid tests were reviewed and are normal. There are no known factors aggravating her hyperlipidemia. Pertinent negatives include no chest pain, focal sensory loss, focal weakness, leg pain, myalgias or shortness of breath. Current antihyperlipidemic treatment includes statins. The current treatment provides significant improvement of lipids. There are no compliance problems.  Risk factors for coronary artery disease include post-menopausal.  Patient does not exercise.  Alzhiemers Patient seems to be worsening, but husband is able to deal with her well with help from caregiver. Caregiver is present every day except Saturday and Sunday. Requires assistance with dressing for past year. He stays with her 24/7. Husband assumes Namenda helps.  Osteoporosis Calcium and vit d oding well no c/o back pain  Patient's husband states she is eating and drinking well. 3 meals a day with some snacks.   Review of Systems  Respiratory: Negative for shortness of breath.   Cardiovascular: Negative for chest pain.  Musculoskeletal: Negative for myalgias, back pain and arthralgias.  Neurological: Negative for focal weakness.  All other systems reviewed and are negative.      Objective:   Physical Exam  Constitutional: She is oriented to person, place, and time. She appears well-developed and well-nourished.  HENT:  Left Ear: External ear normal.  Nose: Nose normal.  Mouth/Throat: Oropharynx is clear and moist.  R. Cerumen Impaction  Eyes: EOM are normal.  Neck: Trachea normal, normal range of motion and full passive range of motion without pain. Neck supple. No JVD present. Carotid bruit is not present. No  thyromegaly present.  Cardiovascular: Normal rate, regular rhythm, normal heart sounds and intact distal pulses.  Exam reveals no gallop and no friction rub.   No murmur heard. Pulmonary/Chest: Effort normal and breath sounds normal.  Abdominal: Soft. Bowel sounds are normal. She exhibits no distension and no mass. There is no tenderness.  Musculoskeletal: Normal range of motion.  Lymphadenopathy:    She has no cervical adenopathy.  Neurological: She is alert and oriented to person, place, and time. She has normal reflexes.  Skin: Skin is warm and dry.  Psychiatric: She has a normal mood and affect. Her behavior is normal. Judgment and thought content normal.    BP 138/67 mmHg  Pulse 108  Temp(Src) 97 F (36.1 C) (Oral)  Ht $R'5\' 3"'hL$  (1.6 m)  Wt 100 lb (45.36 kg)  BMI 17.72 kg/m2      Assessment & Plan:   1. Osteoporosis Weight bearing exercises  2. Alzheimer's disease  3. Hyperlipidemia with target LDL less than 100 Due to weight loss gonnig to encourage her to snack more often - CMP14+EGFR - NMR, lipoprofile    Labs pending Health maintenance reviewed Diet and exercise encouraged Continue all meds Follow up  In 3 month   [place], FNP

## 2014-08-17 LAB — NMR, LIPOPROFILE
Cholesterol: 153 mg/dL (ref 100–199)
HDL Cholesterol by NMR: 76 mg/dL (ref >39)
HDL PARTICLE NUMBER: 33.5 umol/L (ref >=30.5)
LDL Particle Number: 537 nmol/L (ref <1000)
LDL Size: 22 nm (ref >20.5)
LDL-C: 62 mg/dL (ref 0–99)
LP-IR Score: 36 (ref <=45)
TRIGLYCERIDES BY NMR: 76 mg/dL (ref 0–149)

## 2014-08-17 LAB — CMP14+EGFR
A/G RATIO: 1.5 (ref 1.1–2.5)
ALT: 10 IU/L (ref 0–32)
AST: 19 IU/L (ref 0–40)
Albumin: 4 g/dL (ref 3.5–4.7)
Alkaline Phosphatase: 78 IU/L (ref 39–117)
BILIRUBIN TOTAL: 0.4 mg/dL (ref 0.0–1.2)
BUN/Creatinine Ratio: 20 (ref 11–26)
BUN: 26 mg/dL (ref 8–27)
CALCIUM: 10.4 mg/dL — AB (ref 8.7–10.3)
CO2: 24 mmol/L (ref 18–29)
Chloride: 103 mmol/L (ref 97–108)
Creatinine, Ser: 1.33 mg/dL — ABNORMAL HIGH (ref 0.57–1.00)
GFR, EST AFRICAN AMERICAN: 44 mL/min/{1.73_m2} — AB (ref >59)
GFR, EST NON AFRICAN AMERICAN: 38 mL/min/{1.73_m2} — AB (ref >59)
Globulin, Total: 2.6 g/dL (ref 1.5–4.5)
Glucose: 117 mg/dL — ABNORMAL HIGH (ref 65–99)
Potassium: 4.6 mmol/L (ref 3.5–5.2)
SODIUM: 144 mmol/L (ref 134–144)
Total Protein: 6.6 g/dL (ref 6.0–8.5)

## 2014-09-04 ENCOUNTER — Other Ambulatory Visit: Payer: Self-pay | Admitting: Nurse Practitioner

## 2014-09-04 NOTE — Telephone Encounter (Signed)
Last seen 08/16/14 with MMM,last ordered 07/31/14 one month supply with no refills.

## 2014-09-19 ENCOUNTER — Other Ambulatory Visit: Payer: Self-pay | Admitting: Nurse Practitioner

## 2014-10-26 ENCOUNTER — Ambulatory Visit (INDEPENDENT_AMBULATORY_CARE_PROVIDER_SITE_OTHER): Payer: Medicare Other | Admitting: Family Medicine

## 2014-10-26 VITALS — BP 151/77 | HR 105 | Ht 63.0 in | Wt 99.0 lb

## 2014-10-26 DIAGNOSIS — S0181XA Laceration without foreign body of other part of head, initial encounter: Secondary | ICD-10-CM

## 2014-10-26 DIAGNOSIS — T148 Other injury of unspecified body region: Secondary | ICD-10-CM

## 2014-10-26 DIAGNOSIS — IMO0002 Reserved for concepts with insufficient information to code with codable children: Secondary | ICD-10-CM

## 2014-10-26 NOTE — Progress Notes (Signed)
   Subjective:    Patient ID: Kathryn Hamilton, female    DOB: 1931-09-23, 81 y.o.   MRN: [id number]  HPI Patient got out of bed this am and fell and struck her head on the dresser.  She has a laceration. Her tetanus is UTD.  Review of Systems No chest pain, SOB, HA, dizziness, vision change, N/V, diarrhea, constipation, dysuria, urinary urgency or frequency, myalgias, arthralgias or rash.     Objective:   Physical Exam  2.5 cm laceration right forehead.  Laceration is cleansed with betadine and then anesthetized with 1% lidocaine and then approximated with 3  5.0 nylon sutrues and 3 steristrips placed and secure and then bandage applied.      Assessment & Plan:  Laceration Follow up in one week for suture removal.  Lysbeth Penner FNP

## 2014-11-01 ENCOUNTER — Telehealth: Payer: Self-pay | Admitting: Family Medicine

## 2014-11-01 NOTE — Telephone Encounter (Signed)
Sutures can be removed now. She can remove bandage and clean the area with mild soap. Pat dry.  Keep are clean and dry. May leave uncovered.  Unable to reach at the number provided.  Will attempt again at a later time.

## 2014-11-02 NOTE — Telephone Encounter (Signed)
Stp and advised how to clean and remove dressing, pt given appt with Dietrich Pates next week 11/08/14 at 11.

## 2014-11-08 ENCOUNTER — Ambulatory Visit (INDEPENDENT_AMBULATORY_CARE_PROVIDER_SITE_OTHER): Payer: Medicare Other | Admitting: Family Medicine

## 2014-11-08 ENCOUNTER — Encounter: Payer: Self-pay | Admitting: Family Medicine

## 2014-11-08 VITALS — BP 138/72 | HR 117 | Temp 97.4°F | Ht 63.0 in | Wt 100.0 lb

## 2014-11-08 DIAGNOSIS — Z4802 Encounter for removal of sutures: Secondary | ICD-10-CM

## 2014-11-08 DIAGNOSIS — T148 Other injury of unspecified body region: Secondary | ICD-10-CM

## 2014-11-08 NOTE — Progress Notes (Signed)
   Subjective:    Patient ID: Kathryn Hamilton, female    DOB: [birth year], 81 y.o.   MRN: [id number]  HPI Patient is here to have sutures removed from a laceration above her right eye which occurred a week ago.  Review of Systems  Constitutional: Negative for fever.  HENT: Negative for ear pain.   Eyes: Negative for discharge.  Respiratory: Negative for cough.   Cardiovascular: Negative for chest pain.  Gastrointestinal: Negative for abdominal distention.  Endocrine: Negative for polyuria.  Genitourinary: Negative for difficulty urinating.  Musculoskeletal: Negative for gait problem and neck pain.  Skin: Negative for color change and rash.  Neurological: Negative for speech difficulty and headaches.  Psychiatric/Behavioral: Negative for agitation.       Objective:    BP 138/72 mmHg  Pulse 117  Temp(Src) 97.4 F (36.3 C) (Oral)  Ht 5\' 3"  (1.6 m)  Wt 100 lb (45.36 kg)  BMI 17.72 kg/m2 Physical Exam  Skin - #3 sutures removed from well approx laceration above left eye.      Assessment & Plan:     ICD-9-CM ICD-10-CM   1. Visit for suture removal V58.32 Z48.02      Return if symptoms worsen or fail to improve.  Lysbeth Penner FNP

## 2014-11-26 ENCOUNTER — Other Ambulatory Visit: Payer: Self-pay | Admitting: Nurse Practitioner

## 2014-11-30 ENCOUNTER — Other Ambulatory Visit: Payer: Self-pay | Admitting: Nurse Practitioner

## 2015-01-01 ENCOUNTER — Other Ambulatory Visit: Payer: Self-pay | Admitting: Nurse Practitioner

## 2015-02-19 ENCOUNTER — Other Ambulatory Visit: Payer: Self-pay | Admitting: Nurse Practitioner

## 2015-02-26 ENCOUNTER — Other Ambulatory Visit: Payer: Self-pay | Admitting: Nurse Practitioner

## 2015-03-08 ENCOUNTER — Encounter: Payer: Self-pay | Admitting: Nurse Practitioner

## 2015-03-08 ENCOUNTER — Ambulatory Visit (INDEPENDENT_AMBULATORY_CARE_PROVIDER_SITE_OTHER): Payer: Medicare Other | Admitting: Nurse Practitioner

## 2015-03-08 VITALS — BP 116/64 | HR 87 | Temp 97.6°F | Ht 63.0 in | Wt 97.0 lb

## 2015-03-08 DIAGNOSIS — E785 Hyperlipidemia, unspecified: Secondary | ICD-10-CM | POA: Diagnosis not present

## 2015-03-08 DIAGNOSIS — Z681 Body mass index (BMI) 19 or less, adult: Secondary | ICD-10-CM | POA: Diagnosis not present

## 2015-03-08 DIAGNOSIS — G309 Alzheimer's disease, unspecified: Secondary | ICD-10-CM | POA: Diagnosis not present

## 2015-03-08 DIAGNOSIS — F0280 Dementia in other diseases classified elsewhere without behavioral disturbance: Secondary | ICD-10-CM

## 2015-03-08 DIAGNOSIS — M810 Age-related osteoporosis without current pathological fracture: Secondary | ICD-10-CM

## 2015-03-08 MED ORDER — ATORVASTATIN CALCIUM 40 MG PO TABS: 40.0000 mg | ORAL_TABLET | Freq: Every day | ORAL | Status: DC

## 2015-03-08 MED ORDER — MEMANTINE HCL 10 MG PO TABS: ORAL_TABLET | ORAL | Status: DC

## 2015-03-08 MED ORDER — NIACIN ER (ANTIHYPERLIPIDEMIC) 500 MG PO TBCR: 500.0000 mg | EXTENDED_RELEASE_TABLET | Freq: Every day | ORAL | Status: DC

## 2015-03-08 NOTE — Progress Notes (Signed)
Subjective:    Patient ID: Kathryn Hamilton, female    DOB: March 28, 1931, 81 y.o.   MRN: [id number]  Patient is here for chronic disease follow up. No acute complaint.   Hyperlipidemia This is a chronic problem. The current episode started more than 1 year ago. Recent lipid tests were reviewed and are variable. Pertinent negatives include no chest pain, myalgias or shortness of breath. Current antihyperlipidemic treatment includes statins. The current treatment provides moderate improvement of lipids. Compliance problems include adherence to diet and adherence to exercise.  Risk factors for coronary artery disease include dyslipidemia and post-menopausal.  Patient does not exercise.  Alzhiemers Husband says that pateint has gotten much worse since  Last visit. He is going to have to get around the clock care for her because he cannot handle her by hisself. They currently have a  caregiver  That is present every day except Saturday and Sunday. Requires assistance with dressing for past year. He stays with her 24/7. Husband assumes Namenda helps.  Osteoporosis Calcium and vit d oding well no c/o back pain   Review of Systems  Constitutional: Negative.   HENT: Negative.   Respiratory: Negative for shortness of breath.   Cardiovascular: Negative for chest pain.  Musculoskeletal: Negative for myalgias, back pain and arthralgias.  All other systems reviewed and are negative.      Objective:   Physical Exam  Constitutional: She is oriented to person, place, and time. She appears well-developed and well-nourished.  HENT:  Left Ear: External ear normal.  Nose: Nose normal.  Mouth/Throat: Oropharynx is clear and moist.  R. Cerumen Impaction  Eyes: EOM are normal.  Neck: Trachea normal, normal range of motion and full passive range of motion without pain. Neck supple. No JVD present. Carotid bruit is not present. No thyromegaly present.  Cardiovascular: Normal rate, regular rhythm, normal  heart sounds and intact distal pulses.  Exam reveals no gallop and no friction rub.   No murmur heard. Pulmonary/Chest: Effort normal and breath sounds normal.  Abdominal: Soft. Bowel sounds are normal. She exhibits no distension and no mass. There is no tenderness.  Musculoskeletal: Normal range of motion.  Lymphadenopathy:    She has no cervical adenopathy.  Neurological: She is alert and oriented to person, place, and time. She has normal reflexes.  Skin: Skin is warm and dry.  Psychiatric: She has a normal mood and affect. Her behavior is normal. Judgment and thought content normal.    BP 116/64 mmHg  Pulse 87  Temp(Src) 97.6 F (36.4 C) (Oral)  Ht $R'5\' 3"'oD$  (1.6 m)  Wt 97 lb (43.999 kg)  BMI 17.19 kg/m2       Assessment & Plan:   1. Alzheimer's disease - memantine (NAMENDA) 10 MG tablet; TAKE (1) TABLET TWICE A DAY.  Dispense: 60 tablet; Refill: 0  2. Osteoporosis Weight bearing exercises  3. Hyperlipidemia with target LDL less than 100 Low fta diet - atorvastatin (LIPITOR) 40 MG tablet; Take 1 tablet (40 mg total) by mouth daily.  Dispense: 30 tablet; Refill: 0 - niacin (NIASPAN) 500 MG CR tablet; Take 1 tablet (500 mg total) by mouth at bedtime.  Dispense: 30 tablet; Refill: 0 - CMP14+EGFR - NMR, lipoprofile  4. Adult BMI <19 kg/sq m Add ensure to daily diet- at least 2 a day    Labs pending Health maintenance reviewed Diet and exercise encouraged Continue all meds Follow up  In 6 month   [place], FNP

## 2015-03-09 LAB — NMR, LIPOPROFILE
Cholesterol: 126 mg/dL (ref 100–199)
HDL CHOLESTEROL BY NMR: 59 mg/dL (ref >39)
HDL PARTICLE NUMBER: 27.9 umol/L — AB (ref >=30.5)
LDL Particle Number: 448 nmol/L (ref <1000)
LDL SIZE: 22.4 nm (ref >20.5)
LDL-C: 54 mg/dL (ref 0–99)
LP-IR Score: <25 (ref <=45)
Small LDL Particle Number: <90 nmol/L (ref <=527)
Triglycerides by NMR: 66 mg/dL (ref 0–149)

## 2015-03-09 LAB — CMP14+EGFR
A/G RATIO: 1.5 (ref 1.1–2.5)
ALK PHOS: 79 IU/L (ref 39–117)
ALT: 10 IU/L (ref 0–32)
AST: 21 IU/L (ref 0–40)
Albumin: 3.7 g/dL (ref 3.5–4.7)
BUN / CREAT RATIO: 17 (ref 11–26)
BUN: 22 mg/dL (ref 8–27)
Bilirubin Total: 0.8 mg/dL (ref 0.0–1.2)
CALCIUM: 8.4 mg/dL — AB (ref 8.7–10.3)
CO2: 22 mmol/L (ref 18–29)
Chloride: 107 mmol/L (ref 97–108)
Creatinine, Ser: 1.26 mg/dL — ABNORMAL HIGH (ref 0.57–1.00)
GFR calc non Af Amer: 40 mL/min/{1.73_m2} — ABNORMAL LOW (ref >59)
GFR, EST AFRICAN AMERICAN: 46 mL/min/{1.73_m2} — AB (ref >59)
GLUCOSE: 147 mg/dL — AB (ref 65–99)
Globulin, Total: 2.5 g/dL (ref 1.5–4.5)
Potassium: 4.3 mmol/L (ref 3.5–5.2)
Sodium: 144 mmol/L (ref 134–144)
Total Protein: 6.2 g/dL (ref 6.0–8.5)

## 2015-04-23 ENCOUNTER — Other Ambulatory Visit: Payer: Self-pay | Admitting: Nurse Practitioner

## 2015-07-15 ENCOUNTER — Emergency Department (HOSPITAL_COMMUNITY): Payer: Medicare Other

## 2015-07-15 ENCOUNTER — Inpatient Hospital Stay (HOSPITAL_COMMUNITY)
Admission: EM | Admit: 2015-07-15 | Discharge: 2015-07-18 | DRG: 482 | Disposition: A | Discharge status: Skilled Nursing Facility | Payer: Medicare Other | Attending: Internal Medicine | Admitting: Internal Medicine

## 2015-07-15 ENCOUNTER — Inpatient Hospital Stay (HOSPITAL_COMMUNITY): Payer: Medicare Other

## 2015-07-15 ENCOUNTER — Encounter (HOSPITAL_COMMUNITY): Payer: Self-pay | Admitting: *Deleted

## 2015-07-15 DIAGNOSIS — Z7982 Long term (current) use of aspirin: Secondary | ICD-10-CM | POA: Diagnosis not present

## 2015-07-15 DIAGNOSIS — F0280 Dementia in other diseases classified elsewhere without behavioral disturbance: Secondary | ICD-10-CM | POA: Diagnosis present

## 2015-07-15 DIAGNOSIS — S72012A Unspecified intracapsular fracture of left femur, initial encounter for closed fracture: Principal | ICD-10-CM | POA: Diagnosis present

## 2015-07-15 DIAGNOSIS — Y92013 Bedroom of single-family (private) house as the place of occurrence of the external cause: Secondary | ICD-10-CM

## 2015-07-15 DIAGNOSIS — S72002A Fracture of unspecified part of neck of left femur, initial encounter for closed fracture: Secondary | ICD-10-CM | POA: Diagnosis not present

## 2015-07-15 DIAGNOSIS — T148XXA Other injury of unspecified body region, initial encounter: Secondary | ICD-10-CM

## 2015-07-15 DIAGNOSIS — M25552 Pain in left hip: Secondary | ICD-10-CM | POA: Diagnosis present

## 2015-07-15 DIAGNOSIS — G301 Alzheimer's disease with late onset: Secondary | ICD-10-CM | POA: Diagnosis not present

## 2015-07-15 DIAGNOSIS — S72002D Fracture of unspecified part of neck of left femur, subsequent encounter for closed fracture with routine healing: Secondary | ICD-10-CM | POA: Diagnosis not present

## 2015-07-15 DIAGNOSIS — W19XXXD Unspecified fall, subsequent encounter: Secondary | ICD-10-CM | POA: Diagnosis not present

## 2015-07-15 DIAGNOSIS — G309 Alzheimer's disease, unspecified: Secondary | ICD-10-CM | POA: Diagnosis present

## 2015-07-15 DIAGNOSIS — Z8249 Family history of ischemic heart disease and other diseases of the circulatory system: Secondary | ICD-10-CM | POA: Diagnosis not present

## 2015-07-15 DIAGNOSIS — Z87891 Personal history of nicotine dependence: Secondary | ICD-10-CM

## 2015-07-15 DIAGNOSIS — Z85038 Personal history of other malignant neoplasm of large intestine: Secondary | ICD-10-CM

## 2015-07-15 DIAGNOSIS — Z66 Do not resuscitate: Secondary | ICD-10-CM | POA: Diagnosis present

## 2015-07-15 DIAGNOSIS — E785 Hyperlipidemia, unspecified: Secondary | ICD-10-CM | POA: Diagnosis present

## 2015-07-15 DIAGNOSIS — W06XXXA Fall from bed, initial encounter: Secondary | ICD-10-CM | POA: Diagnosis present

## 2015-07-15 DIAGNOSIS — M810 Age-related osteoporosis without current pathological fracture: Secondary | ICD-10-CM | POA: Diagnosis present

## 2015-07-15 DIAGNOSIS — W19XXXA Unspecified fall, initial encounter: Secondary | ICD-10-CM | POA: Insufficient documentation

## 2015-07-15 LAB — CBC WITH DIFFERENTIAL/PLATELET
Basophils Absolute: 0 10*3/uL (ref 0.0–0.1)
Basophils Relative: 0 %
Eosinophils Absolute: 0.1 10*3/uL (ref 0.0–0.7)
Eosinophils Relative: 1 %
HEMATOCRIT: 36.7 % (ref 36.0–46.0)
HEMOGLOBIN: 12 g/dL (ref 12.0–15.0)
LYMPHS ABS: 0.7 10*3/uL (ref 0.7–4.0)
LYMPHS PCT: 10 %
MCH: 30.3 pg (ref 26.0–34.0)
MCHC: 32.7 g/dL (ref 30.0–36.0)
MCV: 92.7 fL (ref 78.0–100.0)
MONOS PCT: 7 %
Monocytes Absolute: 0.5 10*3/uL (ref 0.1–1.0)
NEUTROS ABS: 6.4 10*3/uL (ref 1.7–7.7)
NEUTROS PCT: 82 %
Platelets: 170 10*3/uL (ref 150–400)
RBC: 3.96 MIL/uL (ref 3.87–5.11)
RDW: 15 % (ref 11.5–15.5)
WBC: 7.7 10*3/uL (ref 4.0–10.5)

## 2015-07-15 LAB — BASIC METABOLIC PANEL
Anion gap: 9 (ref 5–15)
BUN: 23 mg/dL — ABNORMAL HIGH (ref 6–20)
CHLORIDE: 107 mmol/L (ref 101–111)
CO2: 25 mmol/L (ref 22–32)
CREATININE: 1.07 mg/dL — AB (ref 0.44–1.00)
Calcium: 9.4 mg/dL (ref 8.9–10.3)
GFR calc non Af Amer: 47 mL/min — ABNORMAL LOW (ref >60)
GFR, EST AFRICAN AMERICAN: 55 mL/min — AB (ref >60)
GLUCOSE: 126 mg/dL — AB (ref 65–99)
Potassium: 4.1 mmol/L (ref 3.5–5.1)
Sodium: 141 mmol/L (ref 135–145)

## 2015-07-15 LAB — SURGICAL PCR SCREEN
MRSA, PCR: NEGATIVE
Staphylococcus aureus: NEGATIVE

## 2015-07-15 LAB — URINALYSIS, ROUTINE W REFLEX MICROSCOPIC
Bilirubin Urine: NEGATIVE
GLUCOSE, UA: NEGATIVE mg/dL
Ketones, ur: NEGATIVE mg/dL
LEUKOCYTES UA: NEGATIVE
Nitrite: NEGATIVE
PH: 6 (ref 5.0–8.0)
PROTEIN: NEGATIVE mg/dL
Specific Gravity, Urine: 1.015 (ref 1.005–1.030)
Urobilinogen, UA: 0.2 mg/dL (ref 0.0–1.0)

## 2015-07-15 LAB — URINE MICROSCOPIC-ADD ON

## 2015-07-15 MED ORDER — HEPARIN SODIUM (PORCINE) 1000 UNIT/ML IJ SOLN: 5000.0000 [IU] | Freq: Once | INTRAMUSCULAR | Status: DC

## 2015-07-15 MED ORDER — ATORVASTATIN CALCIUM 40 MG PO TABS: 40.0000 mg | ORAL_TABLET | Freq: Every day | ORAL | Status: DC

## 2015-07-15 MED ORDER — SODIUM CHLORIDE 0.9 % IV BOLUS (SEPSIS)
500.0000 mL | Freq: Once | INTRAVENOUS | Status: AC
  Administered 2015-07-15: 500 mL via INTRAVENOUS

## 2015-07-15 MED ORDER — INFLUENZA VAC SPLIT QUAD 0.5 ML IM SUSY: 0.5000 mL | PREFILLED_SYRINGE | INTRAMUSCULAR | Status: DC

## 2015-07-15 MED ORDER — ONDANSETRON HCL 4 MG/2ML IJ SOLN: 4.0000 mg | Freq: Four times a day (QID) | INTRAMUSCULAR | Status: DC | PRN

## 2015-07-15 MED ORDER — ONDANSETRON HCL 4 MG PO TABS: 4.0000 mg | ORAL_TABLET | Freq: Four times a day (QID) | ORAL | Status: DC | PRN

## 2015-07-15 MED ORDER — ASPIRIN 81 MG PO CHEW
81.0000 mg | CHEWABLE_TABLET | Freq: Every day | ORAL | Status: DC
  Administered 2015-07-15: 81 mg via ORAL
  Filled 2015-07-15: qty 1

## 2015-07-15 MED ORDER — HEPARIN SODIUM (PORCINE) 5000 UNIT/ML IJ SOLN
5000.0000 [IU] | Freq: Once | INTRAMUSCULAR | Status: AC
  Administered 2015-07-15: 5000 [IU] via SUBCUTANEOUS
  Filled 2015-07-15: qty 1

## 2015-07-15 MED ORDER — MEMANTINE HCL 10 MG PO TABS
10.0000 mg | ORAL_TABLET | Freq: Two times a day (BID) | ORAL | Status: DC
  Administered 2015-07-15: 10 mg via ORAL
  Filled 2015-07-15: qty 1

## 2015-07-15 MED ORDER — ACETAMINOPHEN 650 MG RE SUPP: 650.0000 mg | Freq: Four times a day (QID) | RECTAL | Status: DC | PRN

## 2015-07-15 MED ORDER — ACETAMINOPHEN 325 MG PO TABS: 650.0000 mg | ORAL_TABLET | Freq: Four times a day (QID) | ORAL | Status: DC | PRN

## 2015-07-15 MED ORDER — DEXTROSE-NACL 5-0.9 % IV SOLN
INTRAVENOUS | Status: DC
  Administered 2015-07-15: 17:00:00 via INTRAVENOUS

## 2015-07-15 MED ORDER — OXYCODONE HCL 5 MG PO TABS: 5.0000 mg | ORAL_TABLET | ORAL | Status: DC | PRN

## 2015-07-15 MED ORDER — DOCUSATE SODIUM 100 MG PO CAPS
100.0000 mg | ORAL_CAPSULE | Freq: Two times a day (BID) | ORAL | Status: DC
  Administered 2015-07-15: 100 mg via ORAL
  Filled 2015-07-15: qty 1

## 2015-07-15 MED ORDER — HYDROMORPHONE HCL 1 MG/ML IJ SOLN
0.5000 mg | INTRAMUSCULAR | Status: DC | PRN
  Administered 2015-07-15 – 2015-07-16 (×2): 0.5 mg via INTRAVENOUS
  Filled 2015-07-15 (×2): qty 1

## 2015-07-15 MED ORDER — HEPARIN SODIUM (PORCINE) 5000 UNIT/ML IJ SOLN: 5000.0000 [IU] | Freq: Three times a day (TID) | INTRAMUSCULAR | Status: DC

## 2015-07-15 NOTE — H&P (Signed)
[facility] History and Physical  Kathryn Hamilton WPY:[id number] DOB: Jan 16, 1931    PCP:   Chevis Pretty, FNP   Chief Complaint:  Left hip fracture.   HPI: Kathryn Hamilton is an 81 y.o. female with hx of advanced dementia, osteoporosis, HLD, lives at home with caretaker and her husband, found on the floor, suspicious she may have "rolled out of bed" or fallen, presented to the ER where she was found to have an impacted left femoral neck Fx.  Her husband, a retired Forensic psychologist, has POA/HCP, and indicated that she is a DNR.  He also expressed wishes and gave verbal permission that she undergoes ORIF.  Dr Aline Brochure of orthopedic surgery was consulted by EDP and was aware of her admission.  Hospitalist was asked to admit her for same. She has had no CAD, HTN, DM, and had distant history of colon cancer.  She normally ambulates.    Rewiew of Systems: Unable.   Past Medical History  Diagnosis Date  . Colon cancer ([place])   . Osteoporosis   . Alzheimer disease   . HLD (hyperlipidemia)     Past Surgical History  Procedure Laterality Date  . Colon surgery      Medications:  HOME MEDS: Prior to Admission medications   Medication Sig Start Date End Date Taking? Authorizing Provider  aspirin 81 MG tablet Take 81 mg by mouth daily.   Yes Historical Provider, MD  atorvastatin (LIPITOR) 40 MG tablet Take 1 tablet (40 mg total) by mouth daily. 04/23/15  Yes Mary-Margaret Hassell Done, FNP  Calcium Carbonate (CALCIUM 600 PO) Take by mouth.   Yes Historical Provider, MD  Cholecalciferol (VITAMIN D-3) 1000 UNITS CAPS Take by mouth.   Yes Historical Provider, MD  memantine (NAMENDA) 10 MG tablet TAKE (1) TABLET TWICE A DAY. 04/23/15  Yes Mary-Margaret Hassell Done, FNP  niacin (NIASPAN) 500 MG CR tablet Take 1 tablet (500 mg total) by mouth at bedtime. 04/23/15  Yes Mary-Margaret Hassell Done, FNP     Allergies:  Allergies  Allergen Reactions  . Penicillins Swelling    Tongue swells    Social  History:   reports that she quit smoking about 42 years ago. She does not have any smokeless tobacco history on file. She reports that she does not drink alcohol or use illicit drugs.  Family History: Family History  Problem Relation Age of Onset  . Heart disease Father      Physical Exam: Filed Vitals:   07/15/15 1156 07/15/15 1420  BP: 142/61 145/57  Pulse: 89 90  Temp: 98.3 F (36.8 C)   TempSrc: Oral   Resp: 16 16  SpO2: 100% 97%   Blood pressure 145/57, pulse 90, temperature 98.3 F (36.8 C), temperature source Oral, resp. rate 16, SpO2 97 %.  GEN:  Pleasant  patient lying in the stretcher in no acute distress; cooperative with exam. PSYCH:  Very confused.  does not appear anxious or depressed; affect is appropriate. HEENT: Mucous membranes pink and anicteric; PERRLA; EOM intact; no cervical lymphadenopathy nor thyromegaly or carotid bruit; no JVD; There were no stridor. Neck is very supple. Breasts:: Not examined CHEST WALL: No tenderness CHEST: Normal respiration, clear to auscultation bilaterally.  HEART: Regular rate and rhythm.  There are no murmur, rub, or gallops.   BACK: No kyphosis or scoliosis; no CVA tenderness ABDOMEN: soft and non-tender; no masses, no organomegaly, normal abdominal bowel sounds; no pannus; no intertriginous candida. There is no rebound and no distention. Rectal Exam: Not done  EXTREMITIES: No bone or joint deformity; age-appropriate arthropathy of the hands and knees; no edema; no ulcerations.  There is no calf tenderness.  I did not examine her hips.  Genitalia: not examined PULSES: 2+ and symmetric SKIN: Normal hydration no rash or ulceration CNS: Cranial nerves 2-12 grossly intact no focal lateralizing neurologic deficit.  Speech is fluent; uvula elevated with phonation, facial symmetry and tongue midline. DTR are normal bilaterally, cerebella exam is intact, barbinski is negative and strengths are equaled bilaterally.  No sensory loss.    Labs on Admission:  Basic Metabolic Panel:  Recent Labs Lab 07/15/15 1341  NA 141  K 4.1  CL 107  CO2 25  GLUCOSE 126*  BUN 23*  CREATININE 1.07*  CALCIUM 9.4   CBC:  Recent Labs Lab 07/15/15 1341  WBC 7.7  NEUTROABS 6.4  HGB 12.0  HCT 36.7  MCV 92.7  PLT 170   Radiological Exams on Admission: Dg Pelvis 1-2 Views  07/15/2015   CLINICAL DATA:  LEFT FEMUR PAIN, PELVIC PAIN, Pt fell this morning at her home. Pt has dementia and is unable to tell me where she hurts. EMS states husband said she did not hit her head, HISTORY OF ALZHEIMERS, CANCER, OSTEOPOROSIS  EXAM: PELVIS - 1-2 VIEW  COMPARISON:  None.  FINDINGS: Impacted left femoral neck fracture. Bony pelvis intact. Surgical clips left lower quadrant. Bilateral pelvic vascular calcifications.  IMPRESSION: 1. Impacted left femoral neck fracture.   Electronically Signed   By: Lucrezia Europe M.D.   On: 07/15/2015 13:36   Dg Femur Min 2 Views Left  07/15/2015   CLINICAL DATA:  LEFT FEMUR PAIN, PELVIC PAIN, Pt fell this morning at her home. Pt has dementia and is unable to tell me where she hurts. EMS states husband said she did not hit her head, HISTORY OF ALZHEIMERS, CANCER, OSTEOPOROSIS  EXAM: LEFT FEMUR 2 VIEWS  COMPARISON:  None.  FINDINGS: Impacted femoral neck fracture. No dislocation. Distal femur intact. Tibial plateau not well profiled.  IMPRESSION: 1. Impacted left femoral neck fracture.   Electronically Signed   By: Lucrezia Europe M.D.   On: 07/15/2015 13:37   Assessment/Plan Present on Admission:  . Closed left hip fracture ([place]) . Alzheimer's disease . Hyperlipidemia with target LDL less than 100 . Osteoporosis  PLAN:  Will admit her to general medical floor.  NPO after midnight anticipating surgery as soon as feasible.  Will give her gentle IVF.  Obtaining a preop EKG is reasonable.  Will get UA.  Accepting a moderate risk for cardiovascular complication, given her advanced age, we should proceed with ORIF.  Will continue  with her home meds except for the calcium and Vit D. Will provide pain meds as needed.  Maintain DNR except during perioperative time.  Thank you for allowing me to participate in her care.   Other plans as per orders.  Code Status:  DNR.    Orvan Falconer, MD. [facility] Pager [phone] 7pm to 7am.  07/15/2015, 3:18 PM

## 2015-07-15 NOTE — ED Notes (Signed)
EKG taken to Dr. Lacinda Axon and signed off. EKG very blurry and not of good valued due to pt not being still and unable to re-direct pt to be still.

## 2015-07-15 NOTE — ED Notes (Signed)
Pt fell this morning at her home. Pt has dementia and is unable to tell me where she hurts. EMS states husband said she did not hit her head. NAD noted. Pt following commands at this time.

## 2015-07-15 NOTE — ED Notes (Signed)
Hospitalist in room with pt at this time

## 2015-07-15 NOTE — Progress Notes (Signed)
Heparin bolus ordered. Dr. Aline Brochure notified for clarification. New order for heparin sq x1 dose.

## 2015-07-15 NOTE — ED Notes (Signed)
Pt going to x-ray before being transferred to floor will cause delay in getting pt upstairs.

## 2015-07-15 NOTE — ED Notes (Signed)
MD at bedside. 

## 2015-07-15 NOTE — ED Provider Notes (Signed)
CSN: [id number]     Arrival date & time 07/15/15  1152 History  By signing my name below, I, Hilda Lias, attest that this documentation has been prepared under the direction and in the presence of Nat Christen, MD. Electronically Signed: Hilda Lias, ED Scribe. 07/15/2015. 2:48 PM.    Chief Complaint  Patient presents with  . Fall   LEVEL 5 CAVEAT FOR DEMENTIA    HPI HPI Comments: Kathryn Hamilton is a 81 y.o. female who presents to the Emergency Department complaining of a fall that occurred at 0630 this morning. Her caregiver states that her husband walked into the room and noticed that she ad fallen. Mechanism of fall is unknown. Her husband states she probably fell or rolled out of bed. Her caregiver states she normally walks  on her own.    Past Medical History  Diagnosis Date  . Colon cancer ([place])   . Osteoporosis   . Alzheimer disease   . HLD (hyperlipidemia)    Past Surgical History  Procedure Laterality Date  . Colon surgery     Family History  Problem Relation Age of Onset  . Heart disease Father    Social History  Substance Use Topics  . Smoking status: Former Smoker    Quit date: 01/31/1973  . Smokeless tobacco: None  . Alcohol Use: No   OB History    No data available     Review of Systems  A complete 10 system review of systems was obtained and all systems are negative except as noted in the HPI and PMH.    Allergies  Penicillins  Home Medications   Prior to Admission medications   Medication Sig Start Date End Date Taking? Authorizing Provider  aspirin 81 MG tablet Take 81 mg by mouth daily.   Yes Historical Provider, MD  atorvastatin (LIPITOR) 40 MG tablet Take 1 tablet (40 mg total) by mouth daily. 04/23/15  Yes Mary-Margaret Hassell Done, FNP  Calcium Carbonate (CALCIUM 600 PO) Take by mouth.   Yes Historical Provider, MD  Cholecalciferol (VITAMIN D-3) 1000 UNITS CAPS Take by mouth.   Yes Historical Provider, MD  memantine (NAMENDA) 10 MG  tablet TAKE (1) TABLET TWICE A DAY. 04/23/15  Yes Mary-Margaret Hassell Done, FNP  niacin (NIASPAN) 500 MG CR tablet Take 1 tablet (500 mg total) by mouth at bedtime. 04/23/15  Yes Mary-Margaret Hassell Done, FNP   BP 145/57 mmHg  Pulse 90  Temp(Src) 98.3 F (36.8 C) (Oral)  Resp 16  SpO2 97% Physical Exam  Constitutional: She appears well-developed and well-nourished.  Pt demented and talking gibberish   HENT:  Head: Normocephalic and atraumatic.  Eyes: Conjunctivae and EOM are normal. Pupils are equal, round, and reactive to light.  Neck: Normal range of motion. Neck supple.  Cardiovascular: Normal rate and regular rhythm.   Pulmonary/Chest: Effort normal and breath sounds normal.  Abdominal: Soft. Bowel sounds are normal.  Musculoskeletal: Normal range of motion.  Tender to left femur area  Neurological:  Unable   Skin: Skin is warm and dry.  Psychiatric:  Unable   Nursing note and vitals reviewed.   ED Course  Procedures (including critical care time)  DIAGNOSTIC STUDIES: Oxygen Saturation is 100% on room air, normal by my interpretation.    COORDINATION OF CARE: 12:22 PM Discussed treatment plan with pt at bedside and pt agreed to plan. Pt will receive x-ray on left leg/ hip.    Labs Review Labs Reviewed  BASIC METABOLIC PANEL - Abnormal; Notable for  the following:    Glucose, Bld 126 (*)    BUN 23 (*)    Creatinine, Ser 1.07 (*)    GFR calc non Af Amer 47 (*)    GFR calc Af Amer 55 (*)    All other components within normal limits  CBC WITH DIFFERENTIAL/PLATELET    Imaging Review Dg Pelvis 1-2 Views  07/15/2015   CLINICAL DATA:  LEFT FEMUR PAIN, PELVIC PAIN, Pt fell this morning at her home. Pt has dementia and is unable to tell me where she hurts. EMS states husband said she did not hit her head, HISTORY OF ALZHEIMERS, CANCER, OSTEOPOROSIS  EXAM: PELVIS - 1-2 VIEW  COMPARISON:  None.  FINDINGS: Impacted left femoral neck fracture. Bony pelvis intact. Surgical clips left  lower quadrant. Bilateral pelvic vascular calcifications.  IMPRESSION: 1. Impacted left femoral neck fracture.   Electronically Signed   By: Lucrezia Europe M.D.   On: 07/15/2015 13:36   Dg Femur Min 2 Views Left  07/15/2015   CLINICAL DATA:  LEFT FEMUR PAIN, PELVIC PAIN, Pt fell this morning at her home. Pt has dementia and is unable to tell me where she hurts. EMS states husband said she did not hit her head, HISTORY OF ALZHEIMERS, CANCER, OSTEOPOROSIS  EXAM: LEFT FEMUR 2 VIEWS  COMPARISON:  None.  FINDINGS: Impacted femoral neck fracture. No dislocation. Distal femur intact. Tibial plateau not well profiled.  IMPRESSION: 1. Impacted left femoral neck fracture.   Electronically Signed   By: Lucrezia Europe M.D.   On: 07/15/2015 13:37   I have personally reviewed and evaluated these images and lab results as part of my medical decision-making.   EKG Interpretation None      MDM   Final diagnoses:  Fall  Femoral neck fracture, left, closed, initial encounter    Plain films of left femur reveal an impacted femoral neck fracture. Discussed with Dr. Aline Brochure.  Admit to general medicine Dr Marin Comment.  I personally performed the services described in this documentation, which was scribed in my presence. The recorded information has been reviewed and is accurate.    Nat Christen, MD 07/15/15 [phone]

## 2015-07-16 ENCOUNTER — Inpatient Hospital Stay (HOSPITAL_COMMUNITY): Payer: Medicare Other | Admitting: Anesthesiology

## 2015-07-16 ENCOUNTER — Encounter (HOSPITAL_COMMUNITY): Payer: Self-pay | Admitting: *Deleted

## 2015-07-16 ENCOUNTER — Encounter (HOSPITAL_COMMUNITY)
Admission: EM | Disposition: A | Discharge status: Skilled Nursing Facility | Payer: Self-pay | Source: Home / Self Care | Attending: Internal Medicine

## 2015-07-16 ENCOUNTER — Inpatient Hospital Stay (HOSPITAL_COMMUNITY): Payer: Medicare Other

## 2015-07-16 DIAGNOSIS — W19XXXA Unspecified fall, initial encounter: Secondary | ICD-10-CM | POA: Insufficient documentation

## 2015-07-16 DIAGNOSIS — S72002A Fracture of unspecified part of neck of left femur, initial encounter for closed fracture: Secondary | ICD-10-CM

## 2015-07-16 HISTORY — PX: HIP PINNING,CANNULATED: SHX1758

## 2015-07-16 LAB — CBC
HCT: 36.2 % (ref 36.0–46.0)
HEMOGLOBIN: 11.8 g/dL — AB (ref 12.0–15.0)
MCH: 30.3 pg (ref 26.0–34.0)
MCHC: 32.6 g/dL (ref 30.0–36.0)
MCV: 92.8 fL (ref 78.0–100.0)
Platelets: 167 10*3/uL (ref 150–400)
RBC: 3.9 MIL/uL (ref 3.87–5.11)
RDW: 15.1 % (ref 11.5–15.5)
WBC: 6.7 10*3/uL (ref 4.0–10.5)

## 2015-07-16 LAB — PREPARE RBC (CROSSMATCH)

## 2015-07-16 LAB — ABO/RH: ABO/RH(D): A NEG

## 2015-07-16 SURGERY — FIXATION, FEMUR, NECK, PERCUTANEOUS, USING SCREW
Anesthesia: Spinal | Laterality: Left

## 2015-07-16 MED ORDER — ASPIRIN EC 325 MG PO TBEC
325.0000 mg | DELAYED_RELEASE_TABLET | Freq: Every day | ORAL | Status: DC
  Administered 2015-07-17: 325 mg via ORAL
  Filled 2015-07-16 (×3): qty 1

## 2015-07-16 MED ORDER — PHENOL 1.4 % MT LIQD: 1.0000 | OROMUCOSAL | Status: DC | PRN

## 2015-07-16 MED ORDER — MENTHOL 3 MG MT LOZG: 1.0000 | LOZENGE | OROMUCOSAL | Status: DC | PRN

## 2015-07-16 MED ORDER — TRAMADOL HCL 50 MG PO TABS
50.0000 mg | ORAL_TABLET | Freq: Four times a day (QID) | ORAL | Status: DC
  Administered 2015-07-16 – 2015-07-18 (×6): 50 mg via ORAL
  Filled 2015-07-16 (×7): qty 1

## 2015-07-16 MED ORDER — FENTANYL CITRATE (PF) 100 MCG/2ML IJ SOLN
INTRAMUSCULAR | Status: DC | PRN
  Administered 2015-07-16: 20 ug via INTRAVENOUS

## 2015-07-16 MED ORDER — LACTATED RINGERS IV SOLN
INTRAVENOUS | Status: DC
  Administered 2015-07-16 (×2): via INTRAVENOUS

## 2015-07-16 MED ORDER — BUPIVACAINE IN DEXTROSE 0.75-8.25 % IT SOLN
INTRATHECAL | Status: AC
  Filled 2015-07-16: qty 4

## 2015-07-16 MED ORDER — ACETAMINOPHEN 10 MG/ML IV SOLN
1000.0000 mg | Freq: Four times a day (QID) | INTRAVENOUS | Status: AC
  Administered 2015-07-16: 1000 mg via INTRAVENOUS
  Filled 2015-07-16: qty 100

## 2015-07-16 MED ORDER — CLINDAMYCIN PHOSPHATE 600 MG/50ML IV SOLN
600.0000 mg | Freq: Four times a day (QID) | INTRAVENOUS | Status: AC
  Administered 2015-07-16 (×2): 600 mg via INTRAVENOUS
  Filled 2015-07-16 (×2): qty 50

## 2015-07-16 MED ORDER — FENTANYL CITRATE (PF) 100 MCG/2ML IJ SOLN
INTRAMUSCULAR | Status: AC
  Filled 2015-07-16: qty 4

## 2015-07-16 MED ORDER — CLINDAMYCIN PHOSPHATE 900 MG/50ML IV SOLN
900.0000 mg | INTRAVENOUS | Status: AC
  Administered 2015-07-16: 900 mg via INTRAVENOUS

## 2015-07-16 MED ORDER — FENTANYL CITRATE (PF) 100 MCG/2ML IJ SOLN
25.0000 ug | Freq: Once | INTRAMUSCULAR | Status: AC
  Administered 2015-07-16: 25 ug via INTRAVENOUS

## 2015-07-16 MED ORDER — SODIUM CHLORIDE 0.9 % IR SOLN
Status: DC | PRN
  Administered 2015-07-16: 1000 mL

## 2015-07-16 MED ORDER — MIDAZOLAM HCL 2 MG/2ML IJ SOLN
1.0000 mg | INTRAMUSCULAR | Status: DC | PRN
  Administered 2015-07-16: 1 mg via INTRAVENOUS

## 2015-07-16 MED ORDER — MIDAZOLAM HCL 2 MG/2ML IJ SOLN
INTRAMUSCULAR | Status: AC
  Filled 2015-07-16: qty 2

## 2015-07-16 MED ORDER — BUPIVACAINE-EPINEPHRINE (PF) 0.5% -1:200000 IJ SOLN
INTRAMUSCULAR | Status: AC
  Filled 2015-07-16: qty 30

## 2015-07-16 MED ORDER — TRAMADOL HCL 50 MG PO TABS
50.0000 mg | ORAL_TABLET | Freq: Once | ORAL | Status: DC
  Filled 2015-07-16: qty 1

## 2015-07-16 MED ORDER — ONDANSETRON HCL 4 MG PO TABS: 4.0000 mg | ORAL_TABLET | Freq: Four times a day (QID) | ORAL | Status: DC | PRN

## 2015-07-16 MED ORDER — EPHEDRINE SULFATE 50 MG/ML IJ SOLN
INTRAMUSCULAR | Status: DC | PRN
  Administered 2015-07-16: 5 mg via INTRAVENOUS

## 2015-07-16 MED ORDER — CLINDAMYCIN PHOSPHATE 900 MG/50ML IV SOLN
INTRAVENOUS | Status: AC
  Filled 2015-07-16: qty 50

## 2015-07-16 MED ORDER — SODIUM CHLORIDE 0.9 % IV SOLN: Freq: Once | INTRAVENOUS | Status: DC

## 2015-07-16 MED ORDER — LIDOCAINE HCL (PF) 1 % IJ SOLN
INTRAMUSCULAR | Status: AC
  Filled 2015-07-16: qty 5

## 2015-07-16 MED ORDER — FENTANYL CITRATE (PF) 100 MCG/2ML IJ SOLN
INTRAMUSCULAR | Status: AC
  Filled 2015-07-16: qty 2

## 2015-07-16 MED ORDER — PROPOFOL 500 MG/50ML IV EMUL
INTRAVENOUS | Status: DC | PRN
  Administered 2015-07-16: 10 ug/kg/min via INTRAVENOUS

## 2015-07-16 MED ORDER — METOCLOPRAMIDE HCL 10 MG PO TABS: 5.0000 mg | ORAL_TABLET | Freq: Three times a day (TID) | ORAL | Status: DC | PRN

## 2015-07-16 MED ORDER — EPHEDRINE SULFATE 50 MG/ML IJ SOLN
INTRAMUSCULAR | Status: AC
  Filled 2015-07-16: qty 1

## 2015-07-16 MED ORDER — PROPOFOL 10 MG/ML IV BOLUS
INTRAVENOUS | Status: AC
  Filled 2015-07-16: qty 20

## 2015-07-16 MED ORDER — CHLORHEXIDINE GLUCONATE 4 % EX LIQD
60.0000 mL | Freq: Once | CUTANEOUS | Status: AC
  Administered 2015-07-16: 4 via TOPICAL
  Filled 2015-07-16: qty 15

## 2015-07-16 MED ORDER — ACETAMINOPHEN 10 MG/ML IV SOLN
1000.0000 mg | Freq: Once | INTRAVENOUS | Status: AC
  Administered 2015-07-16: 1000 mg via INTRAVENOUS
  Filled 2015-07-16: qty 100

## 2015-07-16 MED ORDER — BUPIVACAINE-EPINEPHRINE (PF) 0.5% -1:200000 IJ SOLN
INTRAMUSCULAR | Status: DC | PRN
  Administered 2015-07-16: 30 mL

## 2015-07-16 MED ORDER — SODIUM CHLORIDE 0.9 % IJ SOLN
INTRAMUSCULAR | Status: AC
  Filled 2015-07-16: qty 10

## 2015-07-16 MED ORDER — METOCLOPRAMIDE HCL 5 MG/ML IJ SOLN: 5.0000 mg | Freq: Three times a day (TID) | INTRAMUSCULAR | Status: DC | PRN

## 2015-07-16 MED ORDER — ONDANSETRON HCL 4 MG/2ML IJ SOLN: 4.0000 mg | Freq: Once | INTRAMUSCULAR | Status: DC | PRN

## 2015-07-16 MED ORDER — BUPIVACAINE IN DEXTROSE 0.75-8.25 % IT SOLN
INTRATHECAL | Status: DC | PRN
  Administered 2015-07-16: 1.5 mL via INTRATHECAL

## 2015-07-16 MED ORDER — EPINEPHRINE HCL 0.1 MG/ML IJ SOSY
PREFILLED_SYRINGE | INTRAMUSCULAR | Status: DC | PRN
  Administered 2015-07-16: .1 mL via INTRAVENOUS

## 2015-07-16 MED ORDER — PHENYLEPHRINE HCL 10 MG/ML IJ SOLN
INTRAMUSCULAR | Status: DC | PRN
  Administered 2015-07-16 (×6): 40 ug via INTRAVENOUS

## 2015-07-16 MED ORDER — MORPHINE SULFATE (PF) 2 MG/ML IV SOLN
0.5000 mg | INTRAVENOUS | Status: DC | PRN
  Administered 2015-07-18 (×2): 0.5 mg via INTRAVENOUS
  Filled 2015-07-16 (×2): qty 1

## 2015-07-16 MED ORDER — ONDANSETRON HCL 4 MG/2ML IJ SOLN: 4.0000 mg | Freq: Four times a day (QID) | INTRAMUSCULAR | Status: DC | PRN

## 2015-07-16 MED ORDER — FENTANYL CITRATE (PF) 100 MCG/2ML IJ SOLN: 25.0000 ug | INTRAMUSCULAR | Status: DC | PRN

## 2015-07-16 SURGICAL SUPPLY — 54 items
BAG HAMPER (MISCELLANEOUS) ×3 IMPLANT
BIT DRILL 5 ACE CANN QC (BIT) ×2 IMPLANT
BLADE 10 SAFETY STRL DISP (BLADE) ×6 IMPLANT
BLADE HEX COATED 2.75 (ELECTRODE) ×3 IMPLANT
CHLORAPREP W/TINT 26ML (MISCELLANEOUS) ×3 IMPLANT
CLOTH BEACON ORANGE TIMEOUT ST (SAFETY) ×3 IMPLANT
COVER LIGHT HANDLE STERIS (MISCELLANEOUS) ×12 IMPLANT
COVER MAYO STAND XLG (DRAPE) IMPLANT
DECANTER SPIKE VIAL GLASS SM (MISCELLANEOUS) ×6 IMPLANT
DRAPE STERI IOBAN 125X83 (DRAPES) ×3 IMPLANT
DRESSING MEPILEX BORDER 6X8 (GAUZE/BANDAGES/DRESSINGS) IMPLANT
DRSG MEPILEX BORDER 6X8 (GAUZE/BANDAGES/DRESSINGS) ×3
GAUZE KERLIX 2X3 DERM STRL LF (GAUZE/BANDAGES/DRESSINGS) ×3 IMPLANT
GLOVE BIOGEL PI IND STRL 7.0 (GLOVE) IMPLANT
GLOVE BIOGEL PI INDICATOR 7.0 (GLOVE) ×2
GLOVE ECLIPSE 6.5 STRL STRAW (GLOVE) ×4 IMPLANT
GLOVE SKINSENSE NS SZ8.0 LF (GLOVE) ×2
GLOVE SKINSENSE STRL SZ8.0 LF (GLOVE) ×1 IMPLANT
GLOVE SS N UNI LF 8.5 STRL (GLOVE) ×3 IMPLANT
GOWN STRL REUS W/TWL LRG LVL3 (GOWN DISPOSABLE) ×12 IMPLANT
GOWN STRL REUS W/TWL XL LVL3 (GOWN DISPOSABLE) ×3 IMPLANT
INST SET MAJOR BONE (KITS) ×3 IMPLANT
KIT BLADEGUARD II DBL (SET/KITS/TRAYS/PACK) ×3 IMPLANT
KIT ROOM TURNOVER APOR (KITS) ×3 IMPLANT
MANIFOLD NEPTUNE II (INSTRUMENTS) ×3 IMPLANT
MARKER SKIN DUAL TIP RULER LAB (MISCELLANEOUS) ×3 IMPLANT
NDL HYPO 21X1.5 SAFETY (NEEDLE) ×1 IMPLANT
NDL SPNL 18GX3.5 QUINCKE PK (NEEDLE) ×1 IMPLANT
NEEDLE HYPO 21X1.5 SAFETY (NEEDLE) ×3 IMPLANT
NEEDLE SPNL 18GX3.5 QUINCKE PK (NEEDLE) ×3 IMPLANT
NS IRRIG 1000ML POUR BTL (IV SOLUTION) ×3 IMPLANT
PACK BASIC III (CUSTOM PROCEDURE TRAY) ×3
PACK SRG BSC III STRL LF ECLPS (CUSTOM PROCEDURE TRAY) ×1 IMPLANT
PAD ABD 5X9 TENDERSORB (GAUZE/BANDAGES/DRESSINGS) IMPLANT
PENCIL HANDSWITCHING (ELECTRODE) ×3 IMPLANT
PIN THREADED GUIDE ACE (PIN) ×6 IMPLANT
SCREW CANN 6.5 70MM (Screw) ×3 IMPLANT
SCREW CANN 6.5 75MM (Screw) ×6 IMPLANT
SCREW CANN LG 6.5 FLT 70X22 (Screw) IMPLANT
SCREW CANN LG 6.5 FLT 75X22 (Screw) IMPLANT
SCREW CANN LG 6.5 FLT 75X40 (Screw) IMPLANT
SET BASIN LINEN APH (SET/KITS/TRAYS/PACK) ×3 IMPLANT
SPONGE LAP 18X18 X RAY DECT (DISPOSABLE) ×3 IMPLANT
STAPLER VISISTAT 35W (STAPLE) ×3 IMPLANT
SUT BRALON NAB BRD #1 30IN (SUTURE) ×3 IMPLANT
SUT MNCRL 0 VIOLET CTX 36 (SUTURE) ×2 IMPLANT
SUT MON AB 2-0 CT1 36 (SUTURE) ×3 IMPLANT
SUT MONOCRYL 0 CTX 36 (SUTURE) ×2
SYR 30ML LL (SYRINGE) ×3 IMPLANT
SYR BULB IRRIGATION 50ML (SYRINGE) ×6 IMPLANT
TOWEL OR 17X26 4PK STRL BLUE (TOWEL DISPOSABLE) ×3 IMPLANT
WASHER ACECAN 6.5 (Washer) ×2 IMPLANT
YANKAUER SUCT 12FT TUBE ARGYLE (SUCTIONS) ×3 IMPLANT
YANKAUER SUCT BULB TIP 10FT TU (MISCELLANEOUS) ×3 IMPLANT

## 2015-07-16 NOTE — Progress Notes (Signed)
Respiratory Care Note: Entered the patient's room to attempt to instructed an IS. Would not attempt and did not seem to comprehend what we were trying to get her do. RN aware

## 2015-07-16 NOTE — Clinical Social Work Note (Signed)
CSW received consult for SNF due to hip fracture. Pt going to surgery this morning. CSW will follow up tomorrow.  Benay Pike, [place]

## 2015-07-16 NOTE — Op Note (Signed)
Today's date is 07/16/2015 as the operative report for Kathryn Hamilton date of birth October 26, 1930  Diagnosis left femoral neck fracture  Postop diagnosis same  Procedure open treatment internal fixation with cannulated hip screws  Surgeon Aline Brochure  Assisted by Simonne Maffucci  Anesthesia spinal  Blood loss estimated less than 50 mL  Marcaine 30 mL injected subfascial for postop anesthesia  History this is an 81 year old female fell and injured her left hip she couldn't walk or bear weight and therefore she was taken to the hospital and x-ray showed a left femoral neck fracture she was admitted and I advised that she should have surgery to stabilize the fracture  The patient was identified in the preop area the left hip was confirmed as a surgical site using the x-ray secondary to the patient's dementia. Physical exam findings were also used to identify the fracture.  She was taken to the operating room for spinal anesthesia and placed on the fracture table with appropriate padding  The C-arm was brought in and x-rays using the C-arm in multiple planes show that the fracture was valgus impacted  After sterile prep and drape and timeout incision was made over the inferior aspect of the greater trochanter of the left hip  Subcutaneous tissue was divided down to fascia. Fascia was split in line with skin incision exposing the vastus lateralis fascia which was split in line the incision coagulating bleeders as they were encountered  Subperiosteal dissection expose the lateral femur  3 K wires were then placed in the femoral head starting on the lateral femur starting inferiorly until they were parallel and in an inverted triangular fashion. The C-arm was used to confirm position of the pins which were then measured, drilled on the outer cortex and then each screw was placed over the guidewires. The guidewires were removed position of the screws were checked they were found to be  satisfactory  The wounds were irrigated and closed in layered fashion with 0 Monocryl, 0 Monocryl 2-0 Monocryl. Skin staples were used to reapproximate skin edges sterile bandage was applied  Subfascial Marcaine was injected prior to closure  Postop plan weightbearing as tolerated  Aspirin and compression devices for DVT prevention.

## 2015-07-16 NOTE — Brief Op Note (Signed)
07/15/2015 - 07/16/2015  1:53 PM  PATIENT:  Herbert Deaner  81 y.o. female  PRE-OPERATIVE DIAGNOSIS:  left hip femoral neck fracture  POST-OPERATIVE DIAGNOSIS:  left hip femoral neck fracture  PROCEDURE:  Procedure(s): CANNULATED HIP PINNING /INTERNAL FIXATION LEFT HIP (Left)   Surgical findings in valgus impacted left femoral neck fracture mild arthritis  Implants Asnis cannulated screws 3 with one washer  Surgeon assistant was Simonne Maffucci    SURGEON:  Surgeon(s) and Role:    * Carole Civil, MD - Primary ANESTHESIA:   spinal  EBL:  Total I/O In: 1000 [I.V.:1000] Out: 72 [Urine:500; Blood:25]  BLOOD ADMINISTERED:none  DRAINS: none   LOCAL MEDICATIONS USED:  MARCAINE     SPECIMEN:  No Specimen  DISPOSITION OF SPECIMEN:  N/A  COUNTS:  YES  TOURNIQUET:  * No tourniquets in log *  DICTATION: .Dragon Dictation  PLAN OF CARE: Admit to inpatient   PATIENT DISPOSITION:  PACU - hemodynamically stable.   Delay start of Pharmacological VTE agent (>24hrs) due to surgical blood loss or risk of bleeding: yes

## 2015-07-16 NOTE — Plan of Care (Signed)
Problem: Phase I Progression Outcomes Goal: Pre op NPO per MD orders Outcome: Completed/Met Date Met:  07/16/15 Pt NPO per MD orders.

## 2015-07-16 NOTE — Consult Note (Signed)
Reason for Consult: Left hip pain Referring Physician: dr Windy Kalata is an 81 y.o. female.  HPI: 81 year old female with dementia cannot give history apparently fell at home on 07/15/2015 injured her left hip complained of pain with weightbearing. Was brought to the ER x-rays were done and there appears to be a femoral neck fracture impacted without displacement. Patient is an ambulator in the house with a walker and assistance. Dementia noted cannot give history  Past Medical History  Diagnosis Date  . Colon cancer ([place])   . Osteoporosis   . Alzheimer disease   . HLD (hyperlipidemia)     Past Surgical History  Procedure Laterality Date  . Colon surgery      Family History  Problem Relation Age of Onset  . Heart disease Father     Social History:  reports that she quit smoking about 42 years ago. She does not have any smokeless tobacco history on file. She reports that she does not drink alcohol or use illicit drugs.  Allergies:  Allergies  Allergen Reactions  . Penicillins Swelling    Tongue swells    Medications: I have reviewed the patient's current medications.  Results for orders placed or performed during the hospital encounter of 07/15/15 (from the past 48 hour(s))  CBC with Differential     Status: None   Collection Time: 07/15/15  1:41 PM  Result Value Ref Range   WBC 7.7 4.0 - 10.5 K/uL   RBC 3.96 3.87 - 5.11 MIL/uL   Hemoglobin 12.0 12.0 - 15.0 g/dL   HCT 36.7 36.0 - 46.0 %   MCV 92.7 78.0 - 100.0 fL   MCH 30.3 26.0 - 34.0 pg   MCHC 32.7 30.0 - 36.0 g/dL   RDW 15.0 11.5 - 15.5 %   Platelets 170 150 - 400 K/uL   Neutrophils Relative % 82 %   Neutro Abs 6.4 1.7 - 7.7 K/uL   Lymphocytes Relative 10 %   Lymphs Abs 0.7 0.7 - 4.0 K/uL   Monocytes Relative 7 %   Monocytes Absolute 0.5 0.1 - 1.0 K/uL   Eosinophils Relative 1 %   Eosinophils Absolute 0.1 0.0 - 0.7 K/uL   Basophils Relative 0 %   Basophils Absolute 0.0 0.0 - 0.1 K/uL  Basic  metabolic panel     Status: Abnormal   Collection Time: 07/15/15  1:41 PM  Result Value Ref Range   Sodium 141 135 - 145 mmol/L   Potassium 4.1 3.5 - 5.1 mmol/L   Chloride 107 101 - 111 mmol/L   CO2 25 22 - 32 mmol/L   Glucose, Bld 126 (H) 65 - 99 mg/dL   BUN 23 (H) 6 - 20 mg/dL   Creatinine, Ser 1.07 (H) 0.44 - 1.00 mg/dL   Calcium 9.4 8.9 - 10.3 mg/dL   GFR calc non Af Amer 47 (L) >60 mL/min   GFR calc Af Amer 55 (L) >60 mL/min    Comment: (NOTE) The eGFR has been calculated using the CKD EPI equation. This calculation has not been validated in all clinical situations. eGFR's persistently <60 mL/min signify possible Chronic Kidney Disease.    Anion gap 9 5 - 15  Urinalysis, Routine w reflex microscopic (not at [facility])     Status: Abnormal   Collection Time: 07/15/15  1:55 PM  Result Value Ref Range   Color, Urine YELLOW YELLOW   APPearance HAZY (A) CLEAR   Specific Gravity, Urine 1.015 1.005 - 1.030  pH 6.0 5.0 - 8.0   Glucose, UA NEGATIVE NEGATIVE mg/dL   Hgb urine dipstick TRACE (A) NEGATIVE   Bilirubin Urine NEGATIVE NEGATIVE   Ketones, ur NEGATIVE NEGATIVE mg/dL   Protein, ur NEGATIVE NEGATIVE mg/dL   Urobilinogen, UA 0.2 0.0 - 1.0 mg/dL   Nitrite NEGATIVE NEGATIVE   Leukocytes, UA NEGATIVE NEGATIVE  Urine microscopic-add on     Status: None   Collection Time: 07/15/15  1:55 PM  Result Value Ref Range   Squamous Epithelial / LPF RARE RARE   WBC, UA 0-2 <3 WBC/hpf   RBC / HPF 3-6 <3 RBC/hpf   Bacteria, UA RARE RARE  Surgical pcr screen     Status: None   Collection Time: 07/15/15  6:00 PM  Result Value Ref Range   MRSA, PCR NEGATIVE NEGATIVE   Staphylococcus aureus NEGATIVE NEGATIVE    Comment:        The Xpert SA Assay (FDA approved for NASAL specimens in patients over 30 years of age), is one component of a comprehensive surveillance program.  Test performance has been validated by [facility] for patients greater than or equal to 66 year old. It is not  intended to diagnose infection nor to guide or monitor treatment.   CBC     Status: Abnormal   Collection Time: 07/16/15  6:54 AM  Result Value Ref Range   WBC 6.7 4.0 - 10.5 K/uL   RBC 3.90 3.87 - 5.11 MIL/uL   Hemoglobin 11.8 (L) 12.0 - 15.0 g/dL   HCT 36.2 36.0 - 46.0 %   MCV 92.8 78.0 - 100.0 fL   MCH 30.3 26.0 - 34.0 pg   MCHC 32.6 30.0 - 36.0 g/dL   RDW 15.1 11.5 - 15.5 %   Platelets 167 150 - 400 K/uL    Dg Pelvis 1-2 Views  07/15/2015   CLINICAL DATA:  LEFT FEMUR PAIN, PELVIC PAIN, Pt fell this morning at her home. Pt has dementia and is unable to tell me where she hurts. EMS states husband said she did not hit her head, HISTORY OF ALZHEIMERS, CANCER, OSTEOPOROSIS  EXAM: PELVIS - 1-2 VIEW  COMPARISON:  None.  FINDINGS: Impacted left femoral neck fracture. Bony pelvis intact. Surgical clips left lower quadrant. Bilateral pelvic vascular calcifications.  IMPRESSION: 1. Impacted left femoral neck fracture.   Electronically Signed   By: Lucrezia Europe M.D.   On: 07/15/2015 13:36   Dg Hip Unilat With Pelvis 2-3 Views Left  07/15/2015   CLINICAL DATA:  Status post fall.  Left leg pain.  EXAM: DG HIP (WITH OR WITHOUT PELVIS) 2-3V LEFT  COMPARISON:  None.  FINDINGS: Re- demonstrated is a left subcapital left hip fracture. There is no other fracture or dislocation. There is generalized osteopenia.  IMPRESSION: Left subcapital left hip fracture.   Electronically Signed   By: Kathreen Devoid   On: 07/15/2015 16:48   Dg Femur Min 2 Views Left  07/15/2015   CLINICAL DATA:  LEFT FEMUR PAIN, PELVIC PAIN, Pt fell this morning at her home. Pt has dementia and is unable to tell me where she hurts. EMS states husband said she did not hit her head, HISTORY OF ALZHEIMERS, CANCER, OSTEOPOROSIS  EXAM: LEFT FEMUR 2 VIEWS  COMPARISON:  None.  FINDINGS: Impacted femoral neck fracture. No dislocation. Distal femur intact. Tibial plateau not well profiled.  IMPRESSION: 1. Impacted left femoral neck fracture.    Electronically Signed   By: Eden Emms.D.  On: 07/15/2015 13:37    Review of Systems  Unable to perform ROS: dementia   Blood pressure 145/54, pulse 106, temperature 98.1 F (36.7 C), temperature source Axillary, resp. rate 16, height 4' 10" (1.473 m), weight 95 lb 10.9 oz (43.4 kg), SpO2 94 %. Physical Exam  Constitutional: She appears well-developed and well-nourished. She appears distressed.  HENT:  Head: Normocephalic.  Eyes: Pupils are equal, round, and reactive to light.  Neck: Normal range of motion. No JVD present. No tracheal deviation present.  Cardiovascular: Normal rate.   Respiratory: Effort normal.  GI: Soft.  Musculoskeletal:       Right hip: Normal.       Left hip: She exhibits decreased range of motion, tenderness and bony tenderness. She exhibits no swelling, no crepitus, no deformity and no laceration.       Right upper arm: Normal.       Left upper arm: Normal.  Lymphadenopathy:    She has no cervical adenopathy.    She has no axillary adenopathy.  Neurological: She has normal strength and normal reflexes. She is disoriented. She displays atrophy. She displays no tremor. No cranial nerve deficit or sensory deficit. She exhibits normal muscle tone. She displays no seizure activity. Gait abnormal.  Skin: Skin is warm. She is diaphoretic.  Psychiatric: Her speech is normal. Her affect is blunt. She is withdrawn. Cognition and memory are impaired.    Assessment/Plan: Left femoral neck fracture repeat x-rays to include AP and lateral showed hip fracture nondisplaced femoral neck impacted  Recommend surgical fixation with cannulated screws    07/16/2015, 8:06 AM      

## 2015-07-16 NOTE — Progress Notes (Signed)
[facility] PROGRESS NOTE  MEKHI LASCOLA HUD:[id number] DOB: 04/18/1931    PCP:   Chevis Pretty, FNP   HPI: Kathryn Hamilton is an 81 y.o. female with advanced dementia, admitted for hip fx after a fall.  She had ORIF today.  No complaints.   Rewiew of Systems: Unable to obtain.    Past Medical History  Diagnosis Date  . Osteoporosis   . Alzheimer disease   . HLD (hyperlipidemia)   . Colon cancer ([place]) 1996    Past Surgical History  Procedure Laterality Date  . Colon surgery      Medications:  HOME MEDS: Prior to Admission medications   Medication Sig Start Date End Date Taking? Authorizing Provider  aspirin 81 MG tablet Take 81 mg by mouth daily.   Yes Historical Provider, MD  atorvastatin (LIPITOR) 40 MG tablet Take 1 tablet (40 mg total) by mouth daily. 04/23/15  Yes Mary-Margaret Hassell Done, FNP  Calcium Carbonate (CALCIUM 600 PO) Take by mouth.   Yes Historical Provider, MD  Cholecalciferol (VITAMIN D-3) 1000 UNITS CAPS Take by mouth.   Yes Historical Provider, MD  memantine (NAMENDA) 10 MG tablet TAKE (1) TABLET TWICE A DAY. 04/23/15  Yes Mary-Margaret Hassell Done, FNP  niacin (NIASPAN) 500 MG CR tablet Take 1 tablet (500 mg total) by mouth at bedtime. 04/23/15  Yes Mary-Margaret Hassell Done, FNP     Allergies:  Allergies  Allergen Reactions  . Penicillins Swelling    Tongue swells    Social History:   reports that she quit smoking about 42 years ago. She does not have any smokeless tobacco history on file. She reports that she does not drink alcohol or use illicit drugs.  Family History: Family History  Problem Relation Age of Onset  . Heart disease Father      Physical Exam: Filed Vitals:   07/16/15 1615 07/16/15 1700 07/16/15 1800 07/16/15 1914  BP: 134/50 91/53 150/100 91/63  Pulse: 79 138 100 85  Temp: 97.4 F (36.3 C) 97.8 F (36.6 C) 98 F (36.7 C)   TempSrc:      Resp: 16 16 16 16   Height:      Weight:      SpO2: 98% 97% 100% 96%    Blood pressure 91/63, pulse 85, temperature 98 F (36.7 C), temperature source Oral, resp. rate 16, height 4\' 10"  (1.473 m), weight 43.092 kg (95 lb), SpO2 96 %.  GEN:  Pleasant patient lying in the stretcher in no acute distress; she is lethargic after surgery. PSYCH: ; does not appear anxious or depressed; affect is appropriate. HEENT: Mucous membranes pink and anicteric; PERRLA; EOM intact; no cervical lymphadenopathy nor thyromegaly or carotid bruit; no JVD; There were no stridor. Neck is very supple. Breasts:: Not examined CHEST WALL: No tenderness CHEST: Normal respiration, clear to auscultation bilaterally.  HEART: Regular rate and rhythm.  There are no murmur, rub, or gallops.   BACK: No kyphosis or scoliosis; no CVA tenderness ABDOMEN: soft and non-tender; no masses, no organomegaly, normal abdominal bowel sounds; no pannus; no intertriginous candida. There is no rebound and no distention. Rectal Exam: Not done EXTREMITIES: No bone or joint deformity; age-appropriate arthropathy of the hands and knees; no edema; no ulcerations.  There is no calf tenderness. Genitalia: not examined PULSES: 2+ and symmetric SKIN: Normal hydration no rash or ulceration CNS: Cranial nerves 2-12 grossly intact no focal lateralizing neurologic deficit.  Speech is fluent; uvula elevated with phonation, facial symmetry and tongue midline. DTR are  normal bilaterally, cerebella exam is intact, barbinski is negative and strengths are equaled bilaterally.  No sensory loss.   Labs on Admission:  Basic Metabolic Panel:  Recent Labs Lab 07/15/15 1341  NA 141  K 4.1  CL 107  CO2 25  GLUCOSE 126*  BUN 23*  CREATININE 1.07*  CALCIUM 9.4   CBC:  Recent Labs Lab 07/15/15 1341 07/16/15 0654  WBC 7.7 6.7  NEUTROABS 6.4  --   HGB 12.0 11.8*  HCT 36.7 36.2  MCV 92.7 92.8  PLT 170 167    Radiological Exams on Admission: Dg Pelvis 1-2 Views  07/15/2015   CLINICAL DATA:  LEFT FEMUR PAIN, PELVIC  PAIN, Pt fell this morning at her home. Pt has dementia and is unable to tell me where she hurts. EMS states husband said she did not hit her head, HISTORY OF ALZHEIMERS, CANCER, OSTEOPOROSIS  EXAM: PELVIS - 1-2 VIEW  COMPARISON:  None.  FINDINGS: Impacted left femoral neck fracture. Bony pelvis intact. Surgical clips left lower quadrant. Bilateral pelvic vascular calcifications.  IMPRESSION: 1. Impacted left femoral neck fracture.   Electronically Signed   By: Lucrezia Europe M.D.   On: 07/15/2015 13:36   Dg Hip Operative Unilat With Pelvis Left  07/16/2015   CLINICAL DATA:  LEFT hip fracture, ORIF  EXAM: OPERATIVE LEFT HIP (WITH PELVIS IF PERFORMED) 7 VIEWS  TECHNIQUE: Fluoroscopic spot image(s) were submitted for interpretation post-operatively.  COMPARISON:  07/15/2015  FLUOROSCOPY TIME:  1 minutes 27 seconds  Images obtained: 7  FINDINGS: Osseous demineralization.  Sequential images demonstrate a a mildly displaced subcapital fracture of the LEFT femoral neck.  Three cannulated screws for paste across the fracture.  No dislocation  No additional fracture or additional regional osseous abnormality seen.  IMPRESSION: Post pinning of a subcapital fracture of the LEFT femoral neck.   Electronically Signed   By: Lavonia Dana M.D.   On: 07/16/2015 14:11   Dg Hip Unilat With Pelvis 2-3 Views Left  07/15/2015   CLINICAL DATA:  Status post fall.  Left leg pain.  EXAM: DG HIP (WITH OR WITHOUT PELVIS) 2-3V LEFT  COMPARISON:  None.  FINDINGS: Re- demonstrated is a left subcapital left hip fracture. There is no other fracture or dislocation. There is generalized osteopenia.  IMPRESSION: Left subcapital left hip fracture.   Electronically Signed   By: Kathreen Devoid   On: 07/15/2015 16:48   Dg Femur Min 2 Views Left  07/15/2015   CLINICAL DATA:  LEFT FEMUR PAIN, PELVIC PAIN, Pt fell this morning at her home. Pt has dementia and is unable to tell me where she hurts. EMS states husband said she did not hit her head, HISTORY  OF ALZHEIMERS, CANCER, OSTEOPOROSIS  EXAM: LEFT FEMUR 2 VIEWS  COMPARISON:  None.  FINDINGS: Impacted femoral neck fracture. No dislocation. Distal femur intact. Tibial plateau not well profiled.  IMPRESSION: 1. Impacted left femoral neck fracture.   Electronically Signed   By: Lucrezia Europe M.D.   On: 07/15/2015 13:37    Assessment/Plan Present on Admission:  . Closed left hip fracture ([place]) . Alzheimer's disease . Hyperlipidemia with target LDL less than 100 . Osteoporosis . Left displaced femoral neck fracture ([facility])   PLAN:  Hip Fx after mechanical fall.  S/p ORIF.  Stable.  Will continue current therapy. Her dementia is advanced.  Continue current meds.  She is a DNR.    Other plans as per orders.  Code Status: DNR.  Orvan Falconer, MD. [facility] Pager [phone] 7pm to 7am.  07/16/2015, 7:35 PM

## 2015-07-16 NOTE — Transfer of Care (Signed)
Immediate Anesthesia Transfer of Care Note  Patient: Kathryn Hamilton  Procedure(s) Performed: Procedure(s): CANNULATED HIP PINNING /INTERNAL FIXATION LEFT HIP (Left)  Patient Location: PACU  Anesthesia Type:Spinal  Level of Consciousness: awake  Airway & Oxygen Therapy: Patient Spontanous Breathing and Patient connected to face mask oxygen  Post-op Assessment: Report given to RN  Post vital signs: Reviewed and stable  Last Vitals:  Filed Vitals:   07/16/15 1220  BP: 149/66  Pulse: 88  Temp:   Resp: 16    Complications: No apparent anesthesia complications

## 2015-07-16 NOTE — Anesthesia Procedure Notes (Signed)
Spinal Patient location during procedure: OR Start time: 07/16/2015 12:49 PM Staffing Resident/CRNA: Tressie Stalker E Preanesthetic Checklist Completed: patient identified, site marked, surgical consent, pre-op evaluation, timeout performed, IV checked, risks and benefits discussed and monitors and equipment checked Spinal Block Patient position: right lateral decubitus Prep: Betadine Patient monitoring: heart rate, cardiac monitor, continuous pulse ox and blood pressure Approach: right paramedian Location: L3-4 Injection technique: single-shot Needle Needle type: Spinocan  Needle gauge: 22 G Needle length: 9 cm Assessment Sensory level: T8 Additional Notes ATTEMPTS:1 TRAY [id number] TRAY EXPIRATION DATE:12/2015

## 2015-07-16 NOTE — Progress Notes (Signed)
Could not arouse patient enough for her to comprehend swallowing a pill

## 2015-07-16 NOTE — Anesthesia Preprocedure Evaluation (Addendum)
Anesthesia Evaluation  Patient identified by MRN, date of birth, ID band Patient confused    Reviewed: Allergy & Precautions, NPO status , Patient's Chart, lab work & pertinent test results  Airway Mallampati: II  TM Distance: >3 FB     Dental  (+) Teeth Intact   Pulmonary former smoker,    breath sounds clear to auscultation       Cardiovascular  Rhythm:Regular Rate:Normal  Neg  By hx , abn EKG on adm   Neuro/Psych PSYCHIATRIC DISORDERS (Alzheimers Dementia, Ox0)    GI/Hepatic negative GI ROS,   Endo/Other    Renal/GU      Musculoskeletal   Abdominal   Peds  Hematology   Anesthesia Other Findings   Reproductive/Obstetrics                            Anesthesia Physical Anesthesia Plan  ASA: III  Anesthesia Plan: Spinal   Post-op Pain Management:    Induction:   Airway Management Planned: Simple Face Mask  Additional Equipment:   Intra-op Plan:   Post-operative Plan:   Informed Consent: I have reviewed the patients History and Physical, chart, labs and discussed the procedure including the risks, benefits and alternatives for the proposed anesthesia with the patient or authorized representative who has indicated his/her understanding and acceptance.     Plan Discussed with:   Anesthesia Plan Comments:         Anesthesia Quick Evaluation

## 2015-07-16 NOTE — Care Management Note (Signed)
Case Management Note  Patient Details  Name: Kathryn Hamilton MRN: [id number] Date of Birth: 08/22/1931  Subjective/Objective:                  Pt admitted from home with hip fracture. Pt having surgery today. Pt from home with husband and caregivers in the home.  Action/Plan: Will continue to follow for discharge planning needs. :? Need for SNF at discharge.  Expected Discharge Date:                  Expected Discharge Plan:  Skilled Nursing Facility  In-House Referral:  Clinical Social Work  Discharge planning Services  CM Consult  Post Acute Care Choice:  NA Choice offered to:  NA  DME Arranged:    DME Agency:     HH Arranged:    [place] Agency:     Status of Service:  In process, will continue to follow  Medicare Important Message Given:    Date Medicare IM Given:    Medicare IM give by:    Date Additional Medicare IM Given:    Additional Medicare Important Message give by:     If discussed at [place] of Stay Meetings, dates discussed:    Additional Comments:  Joylene Draft, RN 07/16/2015, 1:23 PM

## 2015-07-17 ENCOUNTER — Encounter (HOSPITAL_COMMUNITY): Payer: Self-pay | Admitting: Orthopedic Surgery

## 2015-07-17 LAB — BASIC METABOLIC PANEL
ANION GAP: 4 — AB (ref 5–15)
BUN: 11 mg/dL (ref 6–20)
CALCIUM: 8.5 mg/dL — AB (ref 8.9–10.3)
CO2: 27 mmol/L (ref 22–32)
CREATININE: 0.93 mg/dL (ref 0.44–1.00)
Chloride: 106 mmol/L (ref 101–111)
GFR, EST NON AFRICAN AMERICAN: 56 mL/min — AB (ref >60)
Glucose, Bld: 129 mg/dL — ABNORMAL HIGH (ref 65–99)
Potassium: 3.7 mmol/L (ref 3.5–5.1)
SODIUM: 137 mmol/L (ref 135–145)

## 2015-07-17 LAB — CBC
HCT: 33.7 % — ABNORMAL LOW (ref 36.0–46.0)
Hemoglobin: 11.1 g/dL — ABNORMAL LOW (ref 12.0–15.0)
MCH: 30.6 pg (ref 26.0–34.0)
MCHC: 32.9 g/dL (ref 30.0–36.0)
MCV: 92.8 fL (ref 78.0–100.0)
PLATELETS: 143 10*3/uL — AB (ref 150–400)
RBC: 3.63 MIL/uL — ABNORMAL LOW (ref 3.87–5.11)
RDW: 15.1 % (ref 11.5–15.5)
WBC: 5.8 10*3/uL (ref 4.0–10.5)

## 2015-07-17 NOTE — Progress Notes (Signed)
Foley removed per order - DTV by [id number]

## 2015-07-17 NOTE — Progress Notes (Signed)
[facility] PROGRESS NOTE  Kathryn Hamilton VVO:[id number] DOB: 10/30/1930    PCP:   Chevis Pretty, FNP   HPI: Kathryn Hamilton is an 81 y.o. female with advanced dementia, admitted for hip fx after a fall. She has ORIF POD 1, no complication.   Rewiew of Systems: She appears comfortable, but ROS is not reliable.   Past Medical History  Diagnosis Date  . Osteoporosis   . Alzheimer disease   . HLD (hyperlipidemia)   . Colon cancer ([place]) 1996    Past Surgical History  Procedure Laterality Date  . Colon surgery    . Hip pinning,cannulated Left 07/16/2015    Procedure: CANNULATED HIP PINNING /INTERNAL FIXATION LEFT HIP;  Surgeon: Carole Civil, MD;  Location: [facility];  Service: Orthopedics;  Laterality: Left;    Medications:  HOME MEDS: Prior to Admission medications   Medication Sig Start Date End Date Taking? Authorizing Provider  aspirin 81 MG tablet Take 81 mg by mouth daily.   Yes Historical Provider, MD  atorvastatin (LIPITOR) 40 MG tablet Take 1 tablet (40 mg total) by mouth daily. 04/23/15  Yes Mary-Margaret Hassell Done, FNP  Calcium Carbonate (CALCIUM 600 PO) Take by mouth.   Yes Historical Provider, MD  Cholecalciferol (VITAMIN D-3) 1000 UNITS CAPS Take by mouth.   Yes Historical Provider, MD  memantine (NAMENDA) 10 MG tablet TAKE (1) TABLET TWICE A DAY. 04/23/15  Yes Mary-Margaret Hassell Done, FNP  niacin (NIASPAN) 500 MG CR tablet Take 1 tablet (500 mg total) by mouth at bedtime. 04/23/15  Yes Mary-Margaret Hassell Done, FNP     Allergies:  Allergies  Allergen Reactions  . Penicillins Swelling    Tongue swells    Social History:   reports that she quit smoking about 42 years ago. She does not have any smokeless tobacco history on file. She reports that she does not drink alcohol or use illicit drugs.  Family History: Family History  Problem Relation Age of Onset  . Heart disease Father      Physical Exam: Filed Vitals:   07/17/15 0138 07/17/15 0540  07/17/15 0933 07/17/15 1427  BP: 125/55 123/88 165/55 110/77  Pulse: 110 99 72 83  Temp: 97.6 F (36.4 C) 97.7 F (36.5 C) 98.1 F (36.7 C) 98.2 F (36.8 C)  TempSrc: Oral Oral    Resp: 16 15 18 16   Height:      Weight:      SpO2: 98% 96% 96% 97%   Blood pressure 110/77, pulse 83, temperature 98.2 F (36.8 C), temperature source Oral, resp. rate 16, height 4\' 10"  (1.473 m), weight 43.092 kg (95 lb), SpO2 97 %.  GEN:  Pleasant  patient lying in the stretcher in no acute distress; cooperative with exam. PSYCH:  Totally confused  does not appear anxious or depressed; affect is appropriate. HEENT: Mucous membranes pink and anicteric; PERRLA; EOM intact; no cervical lymphadenopathy nor thyromegaly or carotid bruit; no JVD; There were no stridor. Neck is very supple. Breasts:: Not examined CHEST WALL: No tenderness CHEST: Normal respiration, clear to auscultation bilaterally.  HEART: Regular rate and rhythm.  There are no murmur, rub, or gallops.   BACK: No kyphosis or scoliosis; no CVA tenderness ABDOMEN: soft and non-tender; no masses, no organomegaly, normal abdominal bowel sounds; no pannus; no intertriginous candida. There is no rebound and no distention. Rectal Exam: Not done EXTREMITIES: No bone or joint deformity; age-appropriate arthropathy of the hands and knees; no edema; no ulcerations.  There is no calf  tenderness. Genitalia: not examined PULSES: 2+ and symmetric SKIN: Normal hydration no rash or ulceration CNS: Cranial nerves 2-12 grossly intact no focal lateralizing neurologic deficit.  Speech is fluent; uvula elevated with phonation, facial symmetry and tongue midline. DTR are normal bilaterally, cerebella exam is intact, barbinski is negative and strengths are equaled bilaterally.  No sensory loss.   Labs on Admission:  Basic Metabolic Panel:  Recent Labs Lab 07/15/15 1341 07/17/15 0611  NA 141 137  K 4.1 3.7  CL 107 106  CO2 25 27  GLUCOSE 126* 129*  BUN 23*  11  CREATININE 1.07* 0.93  CALCIUM 9.4 8.5*   CBC:  Recent Labs Lab 07/15/15 1341 07/16/15 0654 07/17/15 0611  WBC 7.7 6.7 5.8  NEUTROABS 6.4  --   --   HGB 12.0 11.8* 11.1*  HCT 36.7 36.2 33.7*  MCV 92.7 92.8 92.8  PLT 170 167 143*    Radiological Exams on Admission: Dg Hip Operative Unilat With Pelvis Left  07/16/2015   CLINICAL DATA:  LEFT hip fracture, ORIF  EXAM: OPERATIVE LEFT HIP (WITH PELVIS IF PERFORMED) 7 VIEWS  TECHNIQUE: Fluoroscopic spot image(s) were submitted for interpretation post-operatively.  COMPARISON:  07/15/2015  FLUOROSCOPY TIME:  1 minutes 27 seconds  Images obtained: 7  FINDINGS: Osseous demineralization.  Sequential images demonstrate a a mildly displaced subcapital fracture of the LEFT femoral neck.  Three cannulated screws for paste across the fracture.  No dislocation  No additional fracture or additional regional osseous abnormality seen.  IMPRESSION: Post pinning of a subcapital fracture of the LEFT femoral neck.   Electronically Signed   By: Lavonia Dana M.D.   On: 07/16/2015 14:11   Assessment/Plan Present on Admission:  . Closed left hip fracture ([place]) . Alzheimer's disease . Hyperlipidemia with target LDL less than 100 . Osteoporosis . Left displaced femoral neck fracture (HCC)  PLAN: Hip Fx after mechanical fall. S/p ORIF  POST OP DAY 1. Stable. Stable H and H, electrolytes and renal Fx test. Will continue current therapy. Her dementia is advanced. Continue current meds. She is a DNR.  She likely needs rehab upon discharge, though she does have around the clock help at help.  CM is assessing.   Other plans as per orders.  Code Status: DNR.   Orvan Falconer, MD. [facility] Pager [phone] 7pm to 7am.  07/17/2015, 4:41 PM

## 2015-07-17 NOTE — Anesthesia Postprocedure Evaluation (Signed)
  Anesthesia Post-op Note  Patient: Kathryn Hamilton  Procedure(s) Performed: Procedure(s): CANNULATED HIP PINNING /INTERNAL FIXATION LEFT HIP (Left)  Patient Location: Nursing Unit  Anesthesia Type:Spinal  Level of Consciousness: awake and alert   Airway and Oxygen Therapy: Patient Spontanous Breathing  Post-op Pain: mild  Post-op Assessment: Post-op Vital signs reviewed, Patient's Cardiovascular Status Stable, Respiratory Function Stable and Patent Airway  Post-op Vital Signs: Reviewed and stable  Last Vitals:  Filed Vitals:   07/17/15 0540  BP: 123/88  Pulse: 99  Temp: 36.5 C  Resp: 15    Complications: No apparent anesthesia complications

## 2015-07-17 NOTE — Clinical Social Work Note (Signed)
Clinical Social Work Assessment  Patient Details  Name: Kathryn Hamilton MRN: [id number] Date of Birth: Mar 02, 1931  Date of referral:  07/17/15               Reason for consult:  Facility Placement                Permission sought to share information with:    Permission granted to share information::     Name::        Agency::     Relationship::     Contact Information:     Housing/Transportation Living arrangements for the past 2 months:  Single Family Home Source of Information:  Spouse Patient Interpreter Needed:  None Criminal Activity/Legal Involvement Pertinent to Current Situation/Hospitalization:  No - Comment as needed Significant Relationships:  Adult Children, Spouse Lives with:  Spouse Do you feel safe going back to the place where you live?  Yes Need for family participation in patient care:  Yes (Comment)  Care giving concerns:  Pt has around the clock care at home.    Social Worker assessment / plan:  CSW met with pt's husband at bedside. Pt sleeping during assessment, but has advanced dementia per husband. He reports that she has around the clock assistance at home. They have hired a caregiver for about 7 hours every day and pt's husband assists her the rest of the time. He indicates that pt sometimes recognizes family, but a lot of days calls him "that man." Pt generally ambulates independently. She requires assist with other ADLs and husband describes it takes a lot of patience and coaxing to complete these. Pt has had several falls at home and fractured her hip Sunday. Post op day 1. PT attempted to work with pt and she was unable to follow directions. PT will see pt again tomorrow. He is aware of requirement for skilled need for Medicare coverage at SNF. [name] reports they can pay privately and also have a long term care insurance policy as well. SNF list provided. He requests information be sent to [facility] and [facility] only at this point. CSW will  follow up tomorrow.   Employment status:  Retired Forensic scientist:  Medicare PT Recommendations:    Information / Referral to community resources:  [place]  Patient/Family's Response to care:  [name] is very hopeful that pt can work with PT and go to rehab at d/c.   Patient/Family's Understanding of and Emotional Response to Diagnosis, Current Treatment, and Prognosis:  [name] is aware of treatment plan and that PT will attempt to work with pt again tomorrow. He appears to be realistic about goals and understands that dementia will play a large role in pt's recovery. He became tearful as he shared that he took his vows seriously. CSW validated feelings as he is aware that this will be a new experience for them both.    Emotional Assessment Appearance:  Appears stated age Attitude/Demeanor/Rapport:  Unable to Assess Affect (typically observed):  Unable to Assess Orientation:    Alcohol / Substance use:  Not Applicable Psych involvement (Current and /or in the community):  No (Comment)  Discharge Needs  Concerns to be addressed:  Discharge Planning Concerns Readmission within the last 30 days:  No Current discharge risk:  Cognitively Impaired Barriers to Discharge:  Continued Medical Work up   Salome Arnt, [place] 07/17/2015, 2:12 PM [phone]

## 2015-07-17 NOTE — Evaluation (Signed)
Physical Therapy Evaluation Patient Details Name: Kathryn Hamilton MRN: [id number] DOB: [birth year] Today's Date: 07/17/2015   History of Present Illness  HPI: 81 year old female with dementia cannot give history apparently fell at home on 07/15/2015 injured her left hip complained of pain with weightbearing. Was brought to the ER x-rays were done and there appears to be a femoral neck fracture impacted without displacement. Patient is an ambulator in the house with a walker and assistance. Dementia noted cannot give history  Clinical Impression   Pt was seen for evaluation.  She was found supine in bed comfortable but unable to communicate with me due to severe dementia.  She occasionally hums, sings or babbles.  No family present during my visit.  Pt would not allow me to check ROM of either leg due to strong resistance to any mobilization.  Total assist was needed to transfer her to sitting at EOB where she had fair sitting balance.  Total assist needed to transfer bed to chair.  Unless there is significant improvement in her cognitive status, she has a poor rehab potential.  We will see her again tomorrow with the hope that she will be able to work with [place].  If not, we will discontinue our service.  She will need total care either in home or in a long term nursing home.    Follow Up Recommendations Other (comment) (to be determined...at this time, she is inappropriate for PT...will see tomorrow to see if there is any improvement in cognition)    Equipment Recommendations  None recommended by PT    Recommendations for Other Services   none    Precautions / Restrictions Precautions Precautions: Fall Restrictions Weight Bearing Restrictions: No LLE Weight Bearing: Weight bearing as tolerated      Mobility  Bed Mobility Overal bed mobility: Needs Assistance Bed Mobility: Supine to Sit     Supine to sit: Total assist;HOB elevated        Transfers Overall transfer level: Needs  assistance Equipment used: None Transfers: Stand Pivot Transfers   Stand pivot transfers: Total assist       General transfer comment: pt up to recliner chair with total assist, some resistance to any mobility  Ambulation/Gait                Stairs            Wheelchair Mobility    Modified Rankin (Stroke Patients Only)       Balance                                             Pertinent Vitals/Pain Pain Assessment: Faces Pain Score: 0-No pain (at rest)    Home Living Family/patient expects to be discharged to:: Unsure                 Additional Comments: pt is unable to give any history..she is not appropriate for SNF due to severe dementia    Prior Function           Comments: unknown     Hand Dominance        Extremity/Trunk Assessment               Lower Extremity Assessment: Difficult to assess due to impaired cognition (unable to assess due to dementia.Marland KitchenMarland Kitchenpt would not follow any directions, uncooperative)  Communication   Communication: Expressive difficulties;Receptive difficulties (due to severe dementia)  Cognition Arousal/Alertness: Awake/alert Behavior During Therapy: WFL for tasks assessed/performed Overall Cognitive Status: No family/caregiver present to determine baseline cognitive functioning (has a hx of severe dementia)                      General Comments      Exercises        Assessment/Plan    PT Assessment Patient needs continued PT services  PT Diagnosis Difficulty walking;Acute pain   PT Problem List Decreased cognition  PT Treatment Interventions Other (comment) (assess for ability to work with PT)   PT Goals (Current goals can be found in the Care Plan section) Acute Rehab PT Goals Patient Stated Goal: goals to be determined if she develops ability to show any rehab potential..if not, will d/c from PT service    Frequency Min 1X/week   Barriers to  discharge   unknown    Co-evaluation               End of Session   Activity Tolerance:  (limited by dementia) Patient left: in chair;with call bell/phone within reach;with chair alarm set Nurse Communication: Mobility status         Time: 0900-0930 PT Time Calculation (min) (ACUTE ONLY): 30 min   Charges:   PT Evaluation $Initial PT Evaluation Tier I: 1 Procedure     PT G CodesDemetrios Isaacs L  PT 07/17/2015, 12:05 PM [phone]

## 2015-07-17 NOTE — Progress Notes (Signed)
OT Screen  Patient Details Name: Kathryn Hamilton MRN: [id number] DOB: 03/28/1931   OT Screen:     Reason evaluation not completed: Pt screened for OT needs. Pt awake and supine in bed this am during OT visit. Pt with severe dementia, unable to interact with OT or follow simple, one-step commands. Chart review completed, pt lives at home with husband and caregiver. Pt appears to require total care at home due to level of severity of dementia. Pt has very poor rehab potential due to cognition, appearing to be at base line with ADL functioning. No further OT services at this time.   Guadelupe Sabin, OTR/L  [phone]  07/17/2015, 1:40 PM

## 2015-07-17 NOTE — Clinical Social Work Placement (Signed)
   CLINICAL SOCIAL WORK PLACEMENT  NOTE  Date:  07/17/2015  Patient Details  Name: Kathryn Hamilton MRN: [id number] Date of Birth: 1931-07-08  Clinical Social Work is seeking post-discharge placement for this patient at the [place] level of care (*CSW will initial, date and re-position this form in  chart as items are completed):  Yes   Patient/family provided with [place] Work Department's list of facilities offering this level of care within the geographic area requested by the patient (or if unable, by the patient's family).  Yes   Patient/family informed of their freedom to choose among providers that offer the needed level of care, that participate in Medicare, Medicaid or managed care program needed by the patient, have an available bed and are willing to accept the patient.  Yes   Patient/family informed of [facility]'s ownership interest in [facility] and [facility], as well as of the fact that they are under no obligation to receive care at these facilities.  PASRR submitted to EDS on 07/16/15     PASRR number received on 07/16/15     Existing PASRR number confirmed on       FL2 transmitted to all facilities in geographic area requested by pt/family on 07/17/15     FL2 transmitted to all facilities within larger geographic area on       Patient informed that his/her managed care company has contracts with or will negotiate with certain facilities, including the following:            Patient/family informed of bed offers received.  Patient chooses bed at       Physician recommends and patient chooses bed at      Patient to be transferred to   on  .  Patient to be transferred to facility by       Patient family notified on   of transfer.  Name of family member notified:        PHYSICIAN       Additional Comment:    _______________________________________________ Salome Arnt, [place] 07/17/2015, 2:10  PM [phone]

## 2015-07-17 NOTE — Progress Notes (Signed)
Postoperative note  Postoperative day number  1  Status post orif left hip cannulated screws   Vital signs  BP 101/64 mmHg  Pulse 90  Temp(Src) 98.1 F (36.7 C) (Oral)  Resp 16  Ht 4\' 10"  (1.473 m)  Wt 95 lb (43.092 kg)  BMI 19.86 kg/m2  SpO2 96%   Pertinent labs   CBC    Component Value Date/Time   WBC 5.8 07/17/2015 0611   RBC 3.63* 07/17/2015 0611   HGB 11.1* 07/17/2015 0611   HCT 33.7* 07/17/2015 0611   PLT 143* 07/17/2015 0611   MCV 92.8 07/17/2015 0611   MCH 30.6 07/17/2015 0611   MCHC 32.9 07/17/2015 0611   RDW 15.1 07/17/2015 0611   LYMPHSABS 0.7 07/15/2015 1341   MONOABS 0.5 07/15/2015 1341   EOSABS 0.1 07/15/2015 1341   BASOSABS 0.0 07/15/2015 1341      Patient complaints  Somnolent today    Assessment and plan   PT notes appreciated  discharge plan asap

## 2015-07-18 ENCOUNTER — Inpatient Hospital Stay
Admission: RE | Admit: 2015-07-18 | Discharge: 2015-10-29 | Disposition: A | Discharge status: Other Acute Inpatient Hospital | Payer: BLUE CROSS/BLUE SHIELD | Source: Ambulatory Visit | Attending: Internal Medicine | Admitting: Internal Medicine

## 2015-07-18 DIAGNOSIS — E785 Hyperlipidemia, unspecified: Secondary | ICD-10-CM

## 2015-07-18 DIAGNOSIS — W19XXXD Unspecified fall, subsequent encounter: Secondary | ICD-10-CM

## 2015-07-18 DIAGNOSIS — T148XXA Other injury of unspecified body region, initial encounter: Principal | ICD-10-CM

## 2015-07-18 DIAGNOSIS — G309 Alzheimer's disease, unspecified: Secondary | ICD-10-CM

## 2015-07-18 DIAGNOSIS — S72002D Fracture of unspecified part of neck of left femur, subsequent encounter for closed fracture with routine healing: Secondary | ICD-10-CM

## 2015-07-18 DIAGNOSIS — F0280 Dementia in other diseases classified elsewhere without behavioral disturbance: Secondary | ICD-10-CM

## 2015-07-18 LAB — BASIC METABOLIC PANEL
ANION GAP: 6 (ref 5–15)
BUN: 11 mg/dL (ref 6–20)
CHLORIDE: 104 mmol/L (ref 101–111)
CO2: 28 mmol/L (ref 22–32)
CREATININE: 0.93 mg/dL (ref 0.44–1.00)
Calcium: 9 mg/dL (ref 8.9–10.3)
GFR calc non Af Amer: 56 mL/min — ABNORMAL LOW (ref >60)
Glucose, Bld: 136 mg/dL — ABNORMAL HIGH (ref 65–99)
Potassium: 5 mmol/L (ref 3.5–5.1)
Sodium: 138 mmol/L (ref 135–145)

## 2015-07-18 LAB — CBC
HEMATOCRIT: 34.4 % — AB (ref 36.0–46.0)
Hemoglobin: 11.4 g/dL — ABNORMAL LOW (ref 12.0–15.0)
MCH: 30.7 pg (ref 26.0–34.0)
MCHC: 33.1 g/dL (ref 30.0–36.0)
MCV: 92.7 fL (ref 78.0–100.0)
Platelets: 150 10*3/uL (ref 150–400)
RBC: 3.71 MIL/uL — ABNORMAL LOW (ref 3.87–5.11)
RDW: 15.1 % (ref 11.5–15.5)
WBC: 7.7 10*3/uL (ref 4.0–10.5)

## 2015-07-18 MED ORDER — LORAZEPAM 0.5 MG PO TABS: 0.5000 mg | ORAL_TABLET | Freq: Every day | ORAL | Status: DC

## 2015-07-18 MED ORDER — ASPIRIN 325 MG PO TBEC: 325.0000 mg | DELAYED_RELEASE_TABLET | Freq: Two times a day (BID) | ORAL | Status: DC

## 2015-07-18 MED ORDER — TRAMADOL HCL 50 MG PO TABS: 50.0000 mg | ORAL_TABLET | Freq: Four times a day (QID) | ORAL | Status: DC

## 2015-07-18 MED ORDER — PANTOPRAZOLE SODIUM 20 MG PO TBEC: 20.0000 mg | DELAYED_RELEASE_TABLET | Freq: Every day | ORAL | Status: DC

## 2015-07-18 NOTE — Clinical Social Work Placement (Signed)
   CLINICAL SOCIAL WORK PLACEMENT  NOTE  Date:  07/18/2015  Patient Details  Name: Kathryn Hamilton MRN: [id number] Date of Birth: June 02, 1931  Clinical Social Work is seeking post-discharge placement for this patient at the [place] level of care (*CSW will initial, date and re-position this form in  chart as items are completed):  Yes   Patient/family provided with [place] Work Department's list of facilities offering this level of care within the geographic area requested by the patient (or if unable, by the patient's family).  Yes   Patient/family informed of their freedom to choose among providers that offer the needed level of care, that participate in Medicare, Medicaid or managed care program needed by the patient, have an available bed and are willing to accept the patient.  Yes   Patient/family informed of [facility]'s ownership interest in [facility] and [facility], as well as of the fact that they are under no obligation to receive care at these facilities.  PASRR submitted to EDS on 07/16/15     PASRR number received on 07/16/15     Existing PASRR number confirmed on       FL2 transmitted to all facilities in geographic area requested by pt/family on 07/17/15     FL2 transmitted to all facilities within larger geographic area on       Patient informed that his/her managed care company has contracts with or will negotiate with certain facilities, including the following:        Yes   Patient/family informed of bed offers received.  Patient chooses bed at [facility]     Physician recommends and patient chooses bed at      Patient to be transferred to [facility] on 07/18/15.  Patient to be transferred to facility by staff     Patient family notified on 07/18/15 of transfer.  Name of family member notified:  Cecille Po- husband     PHYSICIAN       Additional Comment:     _______________________________________________ Salome Arnt, [place] 07/18/2015, 2:50 PM [phone]

## 2015-07-18 NOTE — Progress Notes (Signed)
Patient discharged to [facility] today.  Report given to [facility].  All questions answered and no concerns at this time.  Patient's IV removed with catheter intact, no bleeding or complications.  Patient transported to [facility] in bed by a [facility] staff member.  Discharge packet given to [facility] staff member.

## 2015-07-18 NOTE — Discharge Summary (Signed)
Physician Discharge Summary  Kathryn Hamilton NTI:[id number] DOB: Dec 17, 1930 DOA: 07/15/2015  PCP: Chevis Pretty, FNP  Admit date: 07/15/2015 Discharge date: 07/18/2015  Time spent: 40 minutes  Recommendations for Outpatient Follow-up:  1. Discharge to [facility] for rehab  2. WBAT  3. Remove staples on post op day 10 and apply steri-strips as needed 4. Follow up with Dr. Aline Brochure in 4 weeks with xray of left hip 5. DVT prophylaxis for 28 days  Discharge Diagnoses:  Principal Problem:   Closed left hip fracture ([place]) Active Problems:   Hyperlipidemia with target LDL less than 100   Alzheimer's disease   Osteoporosis   Left displaced femoral neck fracture ([place])   Fall   Discharge Condition: stable  Diet recommendation: low salt  Filed Weights   07/15/15 1733 07/16/15 1117  Weight: 43.4 kg (95 lb 10.9 oz) 43.092 kg (95 lb)    History of present illness:  This is an 81 year old female with history of advanced dementia who was found on the floor and was thought to may have rolled out of bed. She was found to have an impacted left femoral neck fracture and was admitted to the hospital for further treatment.  Hospital Course:  Patient was evaluated by orthopedics, Dr. Aline Brochure and underwent ORIF of left hip on 10/3 . her postoperative course has been unremarkable. She's been evaluated by physical therapy and will need skilled facility placement for physical rehabilitation. She is mildly tachycardic, but this is likely related to pain. She'll be continued on DVT prophylaxis with aspirin twice a day for the next 28 days after which she can return to aspirin once a day. The remainder for blood work/medical issues have remained stable. She is stable for discharge today.    Discharge Exam: Filed Vitals:   07/18/15 0603  BP: 144/60  Pulse: 110  Temp: 98.9 F (37.2 C)  Resp: 20    General: NAD Cardiovascular: S1, S2 RRR Respiratory: CTA B  Discharge  Instructions   Discharge Instructions    Diet - low sodium heart healthy    Complete by:  As directed      Increase activity slowly    Complete by:  As directed           Current Discharge Medication List    START taking these medications   Details  aspirin EC 325 MG EC tablet Take 1 tablet (325 mg total) by mouth 2 (two) times daily. Qty: 30 tablet, Refills: 0    LORazepam (ATIVAN) 0.5 MG tablet Take 1 tablet (0.5 mg total) by mouth at bedtime. Qty: 30 tablet, Refills: 0    pantoprazole (PROTONIX) 20 MG tablet Take 1 tablet (20 mg total) by mouth daily.    traMADol (ULTRAM) 50 MG tablet Take 1 tablet (50 mg total) by mouth every 6 (six) hours. Qty: 30 tablet, Refills: 0      CONTINUE these medications which have NOT CHANGED   Details  atorvastatin (LIPITOR) 40 MG tablet Take 1 tablet (40 mg total) by mouth daily. Qty: 30 tablet, Refills: 4    Calcium Carbonate (CALCIUM 600 PO) Take by mouth.    Cholecalciferol (VITAMIN D-3) 1000 UNITS CAPS Take by mouth.    memantine (NAMENDA) 10 MG tablet TAKE (1) TABLET TWICE A DAY. Qty: 60 tablet, Refills: 4    niacin (NIASPAN) 500 MG CR tablet Take 1 tablet (500 mg total) by mouth at bedtime. Qty: 30 tablet, Refills: 4      STOP taking  these medications     aspirin 81 MG tablet        Allergies  Allergen Reactions  . Penicillins Swelling    Tongue swells      The results of significant diagnostics from this hospitalization (including imaging, microbiology, ancillary and laboratory) are listed below for reference.    Significant Diagnostic Studies: Dg Pelvis 1-2 Views  07/15/2015   CLINICAL DATA:  LEFT FEMUR PAIN, PELVIC PAIN, Pt fell this morning at her home. Pt has dementia and is unable to tell me where she hurts. EMS states husband said she did not hit her head, HISTORY OF ALZHEIMERS, CANCER, OSTEOPOROSIS  EXAM: PELVIS - 1-2 VIEW  COMPARISON:  None.  FINDINGS: Impacted left femoral neck fracture. Bony pelvis  intact. Surgical clips left lower quadrant. Bilateral pelvic vascular calcifications.  IMPRESSION: 1. Impacted left femoral neck fracture.   Electronically Signed   By: Lucrezia Europe M.D.   On: 07/15/2015 13:36   Dg Hip Operative Unilat With Pelvis Left  07/16/2015   CLINICAL DATA:  LEFT hip fracture, ORIF  EXAM: OPERATIVE LEFT HIP (WITH PELVIS IF PERFORMED) 7 VIEWS  TECHNIQUE: Fluoroscopic spot image(s) were submitted for interpretation post-operatively.  COMPARISON:  07/15/2015  FLUOROSCOPY TIME:  1 minutes 27 seconds  Images obtained: 7  FINDINGS: Osseous demineralization.  Sequential images demonstrate a a mildly displaced subcapital fracture of the LEFT femoral neck.  Three cannulated screws for paste across the fracture.  No dislocation  No additional fracture or additional regional osseous abnormality seen.  IMPRESSION: Post pinning of a subcapital fracture of the LEFT femoral neck.   Electronically Signed   By: Lavonia Dana M.D.   On: 07/16/2015 14:11   Dg Hip Unilat With Pelvis 2-3 Views Left  07/15/2015   CLINICAL DATA:  Status post fall.  Left leg pain.  EXAM: DG HIP (WITH OR WITHOUT PELVIS) 2-3V LEFT  COMPARISON:  None.  FINDINGS: Re- demonstrated is a left subcapital left hip fracture. There is no other fracture or dislocation. There is generalized osteopenia.  IMPRESSION: Left subcapital left hip fracture.   Electronically Signed   By: Kathreen Devoid   On: 07/15/2015 16:48   Dg Femur Min 2 Views Left  07/15/2015   CLINICAL DATA:  LEFT FEMUR PAIN, PELVIC PAIN, Pt fell this morning at her home. Pt has dementia and is unable to tell me where she hurts. EMS states husband said she did not hit her head, HISTORY OF ALZHEIMERS, CANCER, OSTEOPOROSIS  EXAM: LEFT FEMUR 2 VIEWS  COMPARISON:  None.  FINDINGS: Impacted femoral neck fracture. No dislocation. Distal femur intact. Tibial plateau not well profiled.  IMPRESSION: 1. Impacted left femoral neck fracture.   Electronically Signed   By: Lucrezia Europe M.D.    On: 07/15/2015 13:37    Microbiology: Recent Results (from the past 240 hour(s))  Surgical pcr screen     Status: None   Collection Time: 07/15/15  6:00 PM  Result Value Ref Range Status   MRSA, PCR NEGATIVE NEGATIVE Final   Staphylococcus aureus NEGATIVE NEGATIVE Final    Comment:        The Xpert SA Assay (FDA approved for NASAL specimens in patients over 53 years of age), is one component of a comprehensive surveillance program.  Test performance has been validated by [facility] for patients greater than or equal to 53 year old. It is not intended to diagnose infection nor to guide or monitor treatment.      Labs:  Basic Metabolic Panel:  Recent Labs Lab 07/15/15 1341 07/17/15 0611 07/18/15 0623  NA 141 137 138  K 4.1 3.7 5.0  CL 107 106 104  CO2 25 27 28   GLUCOSE 126* 129* 136*  BUN 23* 11 11  CREATININE 1.07* 0.93 0.93  CALCIUM 9.4 8.5* 9.0   Liver Function Tests: No results for input(s): AST, ALT, ALKPHOS, BILITOT, PROT, ALBUMIN in the last 168 hours. No results for input(s): LIPASE, AMYLASE in the last 168 hours. No results for input(s): AMMONIA in the last 168 hours. CBC:  Recent Labs Lab 07/15/15 1341 07/16/15 0654 07/17/15 0611 07/18/15 0623  WBC 7.7 6.7 5.8 7.7  NEUTROABS 6.4  --   --   --   HGB 12.0 11.8* 11.1* 11.4*  HCT 36.7 36.2 33.7* 34.4*  MCV 92.7 92.8 92.8 92.7  PLT 170 167 143* 150   Cardiac Enzymes: No results for input(s): CKTOTAL, CKMB, CKMBINDEX, TROPONINI in the last 168 hours. BNP: BNP (last 3 results) No results for input(s): BNP in the last 8760 hours.  ProBNP (last 3 results) No results for input(s): PROBNP in the last 8760 hours.  CBG: No results for input(s): GLUCAP in the last 168 hours.     Signed:  Chloe Baig  [facility] 07/18/2015, 3:25 PM

## 2015-07-18 NOTE — Care Management Note (Signed)
Case Management Note  Patient Details  Name: JEFF MCCALLUM MRN: [id number] Date of Birth: 19-Dec-1930  Subjective/Objective:                    Action/Plan:   Expected Discharge Date:                  Expected Discharge Plan:  Skilled Nursing Facility  In-House Referral:  Clinical Social Work  Discharge planning Services  CM Consult  Post Acute Care Choice:  NA Choice offered to:  NA  DME Arranged:    DME Agency:     HH Arranged:    [place] Agency:     Status of Service:  Completed, signed off  Medicare Important Message Given:  Yes-second notification given Date Medicare IM Given:    Medicare IM give by:    Date Additional Medicare IM Given:    Additional Medicare Important Message give by:     If discussed at [place] of Stay Meetings, dates discussed:    Additional Comments: Pt discharged to [facility] today. CSW to arrange discharge to facility. Christinia Gully Bartolo, RN 07/18/2015, 2:49 PM

## 2015-07-18 NOTE — Progress Notes (Signed)
Orthopedic progress note  Postoperative instructions   Patient's weightbearing status as tolerated  Remove staples postop day 10 apply Steri-Strips as needed  Follow-up in 4 weeks for x-ray  DVT prophylaxis 28 days

## 2015-07-18 NOTE — Progress Notes (Signed)
Physical Therapy Treatment Patient Details Name: Kathryn Hamilton MRN: [id number] DOB: August 02, 1931 Today's Date: 07/18/2015    History of Present Illness HPI: 81 year old female with dementia cannot give history apparently fell at home on 07/15/2015 injured her left hip complained of pain with weightbearing. Was brought to the ER x-rays were done and there appears to be a femoral neck fracture impacted without displacement. Per caregiver, pt was performing limited community distances with supervision, including stairs. Reportedly only  1 othe rfall in the last 3 years. Today, pt is at baseline, as per caregiver, with moderate to severe language deficits, confusion, and inability to follow simple commands.     PT Comments    Further attempts at establishing some baseline functional/cognitive information with caregiver were successful today. Pt is more willing to tolerate PROM, and occasionally assist with AAROM on LLE mediated by tactile cues, but is largely limited by demented state in ability to follow simple-one-step commands, necessary in order to participate and benefit from therapy. Pt will require total care after DC. Pt will still benefit from PROM and mobility of LLE, however this can be performed with assistance from nursing. PT will follow up with care management and pt's husband to address any questions.      Follow Up Recommendations  Other (comment) (Additional attempts made to perform PT; caregiver present to assist. Pt is confirmed to be at baseline and is inappropriate at this time for PT, due to language deficits, confusion, adnd inability to follow commands/participate. PT will FU with husband. )     Equipment Recommendations  None recommended by PT    Recommendations for Other Services       Precautions / Restrictions Precautions Precautions: Fall Restrictions Hamilton Bearing Restrictions: Yes LLE Hamilton Bearing: Hamilton bearing as tolerated    Mobility  Bed  Mobility               General bed mobility comments: not attempted this session; pt is drowsy and does not follow commands, is confused c advanced demetia at baseline.   Transfers                 General transfer comment: not attempted this session; pt is drowsy and does not follow commands, is confused c advanced demetia at baseline.   Ambulation/Gait                 Stairs            Wheelchair Mobility    Modified Rankin (Stroke Patients Only)       Balance                                    Cognition Arousal/Alertness: Lethargic (Falls asleep during therex. ) Behavior During Therapy: Impulsive;Anxious (difficult to predict. ) Overall Cognitive Status: History of cognitive impairments - at baseline (severe language deficits, and inability to follow simple 1-step commands with consistency; caregiver reports pt to be a baseline. )       Memory: Decreased short-term memory;Decreased recall of precautions              Exercises General Exercises - Lower Extremity Gluteal Sets: AROM;Left;10 reps;Supine Short Arc Quad: AAROM;Left;5 reps;Supine Heel Slides:  (unable to perform due to guarding. ) Hip ABduction/ADduction: AAROM;Left;10 reps;Supine (pt follows tactile cues moderately well, but limits ROM. )    General Comments        Pertinent Vitals/Pain  Pain Assessment: Faces Faces Pain Scale: Hurts whole lot (Pt grimaces mostly with active quads contraction on R. Otherwise tolerates most genlte hip ROM with min-moderate pain. ) Pain Intervention(s): Monitored during session    Home Living                      Prior Function            PT Goals (current goals can now be found in the care plan section) Acute Rehab PT Goals Patient Stated Goal: goals to be determined if she develops ability to show any rehab potential..if not, will d/c from PT service    Frequency  Min 1X/week    PT Plan Current plan  remains appropriate    Co-evaluation             End of Session   Activity Tolerance: Patient limited by lethargy;Patient limited by fatigue;Patient tolerated treatment well Patient left: in bed;with family/visitor present;with bed alarm set     Time: 4287-6811 PT Time Calculation (min) (ACUTE ONLY): 14 min  Charges:  $Therapeutic Activity: 8-22 mins                    G Codes:      Buccola,Allan C 08/12/15, 10:21 AM 10:25 AM  Etta Grandchild, PT, DPT [place] License # [id number]

## 2015-07-18 NOTE — Care Management Important Message (Signed)
Important Message  Patient Details  Name: Kathryn Hamilton MRN: [id number] Date of Birth: 1930/12/28   Medicare Important Message Given:  Yes-second notification given    Joylene Draft, RN 07/18/2015, 2:49 PM

## 2015-07-18 NOTE — Progress Notes (Signed)
Stopped in patient's room and addressed family's questions and concerns about PT at [facility]. Advised family best course of action is to speak directly about patient to therapy staff as well as nursing staff that will be working with her.  Family would like staff to be aware that patient responds well to music.  Deniece Ree PT, DPT [phone]

## 2015-07-19 ENCOUNTER — Encounter (HOSPITAL_COMMUNITY)
Admission: AD | Admit: 2015-07-19 | Discharge: 2015-07-19 | Disposition: A | Discharge status: Home / Self Care | Payer: Medicare Other | Source: Skilled Nursing Facility | Attending: Internal Medicine | Admitting: Internal Medicine

## 2015-07-19 LAB — BASIC METABOLIC PANEL
Anion gap: 9 (ref 5–15)
BUN: 14 mg/dL (ref 6–20)
CALCIUM: 8.9 mg/dL (ref 8.9–10.3)
CO2: 24 mmol/L (ref 22–32)
CREATININE: 0.88 mg/dL (ref 0.44–1.00)
Chloride: 101 mmol/L (ref 101–111)
GFR, EST NON AFRICAN AMERICAN: 60 mL/min — AB (ref >60)
GLUCOSE: 116 mg/dL — AB (ref 65–99)
Potassium: 3.6 mmol/L (ref 3.5–5.1)
Sodium: 134 mmol/L — ABNORMAL LOW (ref 135–145)

## 2015-07-19 LAB — CBC
HCT: 36.4 % (ref 36.0–46.0)
Hemoglobin: 12.1 g/dL (ref 12.0–15.0)
MCH: 30.7 pg (ref 26.0–34.0)
MCHC: 33.2 g/dL (ref 30.0–36.0)
MCV: 92.4 fL (ref 78.0–100.0)
PLATELETS: 151 10*3/uL (ref 150–400)
RBC: 3.94 MIL/uL (ref 3.87–5.11)
RDW: 14.8 % (ref 11.5–15.5)
WBC: 8.5 10*3/uL (ref 4.0–10.5)

## 2015-07-20 ENCOUNTER — Non-Acute Institutional Stay (SKILLED_NURSING_FACILITY): Payer: Medicare Other | Admitting: Internal Medicine

## 2015-07-20 DIAGNOSIS — G301 Alzheimer's disease with late onset: Secondary | ICD-10-CM

## 2015-07-20 DIAGNOSIS — M810 Age-related osteoporosis without current pathological fracture: Secondary | ICD-10-CM | POA: Diagnosis not present

## 2015-07-20 DIAGNOSIS — F0280 Dementia in other diseases classified elsewhere without behavioral disturbance: Secondary | ICD-10-CM | POA: Diagnosis not present

## 2015-07-20 DIAGNOSIS — S72002D Fracture of unspecified part of neck of left femur, subsequent encounter for closed fracture with routine healing: Secondary | ICD-10-CM | POA: Diagnosis not present

## 2015-07-20 DIAGNOSIS — E785 Hyperlipidemia, unspecified: Secondary | ICD-10-CM

## 2015-07-20 LAB — TYPE AND SCREEN
ABO/RH(D): A NEG
ANTIBODY SCREEN: NEGATIVE
Unit division: 0
Unit division: 0

## 2015-07-20 NOTE — Progress Notes (Signed)
Patient ID: Kathryn Hamilton, female   DOB: 30-Oct-1930, 81 y.o.   MRN: [id number]   This is an acute visit.  Level care skilled.  Facility [facility].  Chief complaint-acute visit status post hospitalization for left hip fracture with repair.  History of present illness.  Patient is an 81 year old female with a history of advanced dementia who was found on the floor consult 12 rolled out of bed-she had an impacted left femoral neck fracture-and underwent an ORIF of the left hip on October 3.  She had an unremarkable postop course she is here for therapy.  Previous medical history.  Close left hip fracture with repair.  Hyperlipidemia with LDL goal less than 100.  Alzheimer's disease.  Osteoporosis.  Left displaced femoral neck fracture.  History of fall.  Surgical history-previous history of colon surgery.  Medications.  Aspirin enteric-coated 325 mg daily.  Ativan 0.5 mg daily at bedtime.  Protonic 20 mg daily.  Tramadol 50 mg every 6 hours.  Lipitor 40 mg daily.  Calcium carbonate 600 mg 1 tab by mouth daily.  Vitamin D3 1000 units daily.  Namenda 10 mg twice a day.  Niaspan 500 mg daily at bedtime.  .  Social history patient ex-smoker quit about 42 years ago no smokeless tobacco or significant alcohol or illicit drug history.  Family history significant for heart disease in her father.  Review of systems essentially unobtainable secondary to severe dementia.  Physical exam.  She is afebrile pulse 62 respirations 20 per show 104/76.  In general this is a frail elderly female who is agitated with exam she is not in any distress.  Her skin is warm and dry surgical site left hip per nursing is benign appearing without sign of infection is currently dressed.  Eyes pupils appear reactive to light sclera and conjunctiva clear visual acuity appears grossly intact.  Oropharynx difficult to assess since patient would not open her mouth.  Chest is  clear to auscultation with poor respiratory effort.  Heart regular rate and rhythm without murmur gallop or rub she does have positive pedal pulses and no significant lower extremity edema.  Her abdomen is soft nontender positive bowel sounds.  Muscle skeletal moves all extremes 4 were limited left lower extremity secondary to surgery I do not note any deformities other than age-related arthritic changes.  Neurologic she is alert cannot appreciate any focal deficits difficult exam since patient has significant agitation appears to have severe dementia.  Again she does psych-wise have severe dementia.    LABS  07/19/2015.  Sodium 134 potassium 3.6 BUN 14 creatinine 0.88.  WBC 8.5 hemoglobin 12.1 platelets 151.  Assessment and plan.  #1 history of left hip fracture with repair-again she will need therapy this could be complicated however secondary to her severe dementia she is on aspirin twice a day 325 mg for anticoagulation for 28 days to be reduced to once a day after her 28 day course is complete-she does have tramadol as needed for pain.  #2 history of severe dementia this is quite significant suspect will hinder her therapy she is on Namenda-she does have Ativan at night for agitation-anxiety.  #3 osteoporosis she is on calcium. As well as vitamin D  #4 history hyperlipidemia she is on a statin as well as Niaspan.  #5-question anemia-her hemoglobin is stable at 12.1 a does not appear she required a postop transfusion baseline hemoglobin appears in the 111-12 range per chart review  [id number]-of note greater than 35 minutes  spent assessing patient-discussing her status with nursing staff-reviewing her chart-the coordinating and formulating a plan of care-of note greater than 50% of time spent coordinating plan of care with chart review

## 2015-07-22 ENCOUNTER — Non-Acute Institutional Stay (SKILLED_NURSING_FACILITY): Payer: Medicare Other | Admitting: Internal Medicine

## 2015-07-22 DIAGNOSIS — F0281 Dementia in other diseases classified elsewhere with behavioral disturbance: Secondary | ICD-10-CM | POA: Diagnosis not present

## 2015-07-22 DIAGNOSIS — M810 Age-related osteoporosis without current pathological fracture: Secondary | ICD-10-CM | POA: Diagnosis not present

## 2015-07-22 DIAGNOSIS — E785 Hyperlipidemia, unspecified: Secondary | ICD-10-CM | POA: Diagnosis not present

## 2015-07-22 DIAGNOSIS — G301 Alzheimer's disease with late onset: Secondary | ICD-10-CM

## 2015-07-22 DIAGNOSIS — S72142A Displaced intertrochanteric fracture of left femur, initial encounter for closed fracture: Secondary | ICD-10-CM

## 2015-07-22 DIAGNOSIS — F02818 Dementia in other diseases classified elsewhere, unspecified severity, with other behavioral disturbance: Secondary | ICD-10-CM

## 2015-07-22 NOTE — Progress Notes (Signed)
Patient ID: Kathryn Hamilton, female   DOB: 18-Feb-1931, 81 y.o.   MRN: [id number]     Facility; [facility] Chief complaint; admission to SNF post admit to [facility] from 10/2 to 07/18/2015  History; this is a patient with severe Alzheimer's disease. She fell perhaps out of bed suffering a left hip fracture. She lives at home and [facility] with her husband and caregivers in the home. She does not have a substantial history of falling although she does have osteoporosis. She underwent a left hip ORIF on 10/3. Her postoperative course is been unremarkable. She did develop perioperative tachycardia which her husband says with A. fib although I don't see this stated anywhere in her record. She is here for rehabilitation.  BMP Latest Ref Rng 07/19/2015 07/18/2015 07/17/2015  Glucose 65 - 99 mg/dL 116(H) 136(H) 129(H)  BUN 6 - 20 mg/dL 14 11 11   Creatinine 0.44 - 1.00 mg/dL 0.88 0.93 0.93  BUN/Creat Ratio 11 - 26 - - -  Sodium 135 - 145 mmol/L 134(L) 138 137  Potassium 3.5 - 5.1 mmol/L 3.6 5.0 3.7  Chloride 101 - 111 mmol/L 101 104 106  CO2 22 - 32 mmol/L 24 28 27   Calcium 8.9 - 10.3 mg/dL 8.9 9.0 8.5(L)    CBC Latest Ref Rng 07/19/2015 07/18/2015 07/17/2015  WBC 4.0 - 10.5 K/uL 8.5 7.7 5.8  Hemoglobin 12.0 - 15.0 g/dL 12.1 11.4(L) 11.1(L)  Hematocrit 36.0 - 46.0 % 36.4 34.4(L) 33.7(L)  Platelets 150 - 400 K/uL 151 150 143(L)    Past Medical History  Diagnosis Date  . Osteoporosis   . Alzheimer disease   . HLD (hyperlipidemia)   . Colon cancer ([place]) 1996    Past Surgical History  Procedure Laterality Date  . Colon surgery    . Hip pinning,cannulated Left 07/16/2015    Procedure: CANNULATED HIP PINNING /INTERNAL FIXATION LEFT HIP;  Surgeon: Carole Civil, MD;  Location: [facility];  Service: Orthopedics;  Laterality: Left;   Current Outpatient Prescriptions on File Prior to Visit  Medication Sig Dispense Refill  . aspirin EC 325 MG EC tablet Take 1 tablet (325 mg total) by  mouth 2 (two) times daily. 30 tablet 0  . atorvastatin (LIPITOR) 40 MG tablet Take 1 tablet (40 mg total) by mouth daily. 30 tablet 4  . Calcium Carbonate (CALCIUM 600 PO) Take by mouth.    . Cholecalciferol (VITAMIN D-3) 1000 UNITS CAPS Take by mouth.    Marland Kitchen LORazepam (ATIVAN) 0.5 MG tablet Take 1 tablet (0.5 mg total) by mouth at bedtime. 30 tablet 0  . memantine (NAMENDA) 10 MG tablet TAKE (1) TABLET TWICE A DAY. 60 tablet 4  . niacin (NIASPAN) 500 MG CR tablet Take 1 tablet (500 mg total) by mouth at bedtime. 30 tablet 4  . pantoprazole (PROTONIX) 20 MG tablet Take 1 tablet (20 mg total) by mouth daily.    . traMADol (ULTRAM) 50 MG tablet Take 1 tablet (50 mg total) by mouth every 6 (six) hours. 30 tablet 0    Socially; the patient lives in [place] with her husband. He has caregivers in the home during the day. It sounds as though the patient wanders within the home but does not go outside. As mentioned she does not have a substantial history of falling perhaps 3 over the last 2 years including this one. The patient has a signed DO NOT RESUSCITATE  reports that she quit smoking about 42 years ago. She  does not have any smokeless tobacco history on file. She reports that she does not drink alcohol or use illicit drugs.  fam hxindicated that her mother is deceased. She indicated that her father is deceased.  Son lives in [place]  Review of systems; not possible from the patient due to the severity of dementia. However her family reports that she has double incontinence. Starting to lose some ability to feed herself  Physical examination Gen. patient is a very slight of build, but in no distress. HEENT the patient will not open her mouth Neck no thyroid is palpable. Lymph none in the cervical clavicular or axillary areas. Respiratory clear entry bilaterally. Cardiac heart sounds are normal no does not sound like atrial fibrillation. Abdomen; no liver no spleen no  tenderness GU bladder not distended no CVA tenderness. Extremities; I was not able to get to her surgical incision site there is no edema no evidence of a DVT Neurologic; she appears to be able to move all her limbs, pushing her wheelchair restlessly around the room. Mental status; compatible with severe dementia no overt depression or delirium.  Impression/plan #1 status post left hip ORIF; she is on twice a day aspirin for DVT prophylaxis. #2 advanced Alzheimer's disease with double incontinence she is on Namenda 10 mg twice a day #3 hyperlipidemia on Lipitor and Niaspan; the benefit of this combination is not really clear to me. I think the Niaspan can probably stop. Even the benefit of Lipitor in primary prevention in somebody with advanced Alzheimer's disease could be question although I'll not change that while she is here. #4 postoperative tachycardia. I really didn't appreciate this and I don't see anything about atrial fibrillation her husband was concerned about. #5 osteoporosis; she is on vitamin D3 and calcium. Patient apparently chews her medications that would exclude her from a lot of additional osteoporosis medications  The patient is at some risk for becoming nonambulatory in this setting. I did not get into this discussion with her husband today. He states he would like her to be ambulatory again before attempting to take her back, which is his plan.

## 2015-07-28 ENCOUNTER — Encounter: Payer: Self-pay | Admitting: Internal Medicine

## 2015-08-08 ENCOUNTER — Non-Acute Institutional Stay (SKILLED_NURSING_FACILITY): Payer: Medicare Other | Admitting: Internal Medicine

## 2015-08-08 ENCOUNTER — Other Ambulatory Visit (HOSPITAL_COMMUNITY)
Admission: RE | Admit: 2015-08-08 | Discharge: 2015-08-08 | Disposition: A | Discharge status: Home / Self Care | Payer: No Typology Code available for payment source | Source: Skilled Nursing Facility | Attending: Internal Medicine | Admitting: Internal Medicine

## 2015-08-08 DIAGNOSIS — N390 Urinary tract infection, site not specified: Secondary | ICD-10-CM | POA: Diagnosis present

## 2015-08-08 DIAGNOSIS — R319 Hematuria, unspecified: Secondary | ICD-10-CM | POA: Diagnosis not present

## 2015-08-08 LAB — URINALYSIS W MICROSCOPIC (NOT AT ARMC)
Bilirubin Urine: NEGATIVE
Glucose, UA: NEGATIVE mg/dL
Ketones, ur: NEGATIVE mg/dL
Nitrite: NEGATIVE
Protein, ur: 100 mg/dL — AB
SPECIFIC GRAVITY, URINE: 1.015 (ref 1.005–1.030)
UROBILINOGEN UA: 0.2 mg/dL (ref 0.0–1.0)
pH: 7 (ref 5.0–8.0)

## 2015-08-10 LAB — URINE CULTURE

## 2015-08-15 ENCOUNTER — Ambulatory Visit (HOSPITAL_COMMUNITY)
Admission: RE | Admit: 2015-08-15 | Discharge: 2015-08-15 | Disposition: A | Discharge status: Home / Self Care | Payer: No Typology Code available for payment source | Source: Ambulatory Visit | Attending: Orthopedic Surgery | Admitting: Orthopedic Surgery

## 2015-08-15 DIAGNOSIS — M25552 Pain in left hip: Secondary | ICD-10-CM | POA: Insufficient documentation

## 2015-08-15 NOTE — Progress Notes (Signed)
Patient ID: Kathryn Hamilton, female   DOB: [birth year], 81 y.o.   MRN: [id number]                PROGRESS NOTE  DATE:  08/08/2015          FACILITY: [facility]                       LEVEL OF CARE:   SNF   Acute Visit                CHIEF COMPLAINT:  ?Hematuria.      HISTORY OF PRESENT ILLNESS:  This is a patient with severe Alzheimer's disease whom we admitted here earlier this month after falling and suffering a left hip fracture.    I have discussed things with the staff as well as the patient's family and caregiver who are with her.  The caregiver states that the patient will look uncomfortable before she voids and then when she voids, there is a small amount of urine.  It is not actually clear that the blood they see in the toilet is coming from the urethra.  It could be vaginal, does not seem to be rectal.     REVIEW OF SYSTEMS:   Not possible from the patient secondary to the severity of dementia.    PHYSICAL EXAMINATION:   VITAL SIGNS:     TEMPERATURE:  97.7.    PULSE:  94.    RESPIRATIONS:  20.    BLOOD PRESSURE:  142/76.   02 SATURATIONS:  97% on room air.     GENERAL APPEARANCE:  The patient is not in any distress.  She is afebrile.       CHEST/RESPIRATORY:  Clear air entry bilaterally.    CARDIOVASCULAR:   CARDIAC:  Heart sounds are normal.   GASTROINTESTINAL:   LIVER/SPLEEN/KIDNEYS:  No liver, no spleen.  No tenderness.     GENITOURINARY:   BLADDER:  I do not believe her bladder is distended, and there is no CVA tenderness.    ASSESSMENT/PLAN:                  Hematuria.  We are not actually sure that this is hematuria and not completely certain that this is a UTI although, common things being common, I am going to ask for a urinalysis and C&S.  Empiric antibiotics seem indicated just based on the story we are hearing.  If she does indeed have blood in her urine on a cath urine specimen, we would need to discuss what to do about this further and  whether antibiotic treatment resolves the issue.     CPT CODE: 38937

## 2015-08-16 ENCOUNTER — Ambulatory Visit: Payer: Medicare Other | Admitting: Orthopedic Surgery

## 2015-08-18 ENCOUNTER — Other Ambulatory Visit (HOSPITAL_COMMUNITY)
Admission: RE | Admit: 2015-08-18 | Discharge: 2015-08-18 | Disposition: A | Discharge status: Home / Self Care | Payer: Medicare Other | Source: Skilled Nursing Facility | Attending: Internal Medicine | Admitting: Internal Medicine

## 2015-08-18 DIAGNOSIS — R3989 Other symptoms and signs involving the genitourinary system: Secondary | ICD-10-CM | POA: Diagnosis present

## 2015-08-18 LAB — URINALYSIS, ROUTINE W REFLEX MICROSCOPIC
Bilirubin Urine: NEGATIVE
Glucose, UA: NEGATIVE mg/dL
Ketones, ur: NEGATIVE mg/dL
NITRITE: NEGATIVE
PROTEIN: NEGATIVE mg/dL
SPECIFIC GRAVITY, URINE: 1.015 (ref 1.005–1.030)
UROBILINOGEN UA: 0.2 mg/dL (ref 0.0–1.0)
pH: 8 (ref 5.0–8.0)

## 2015-08-18 LAB — URINE MICROSCOPIC-ADD ON

## 2015-08-20 ENCOUNTER — Telehealth: Payer: Self-pay | Admitting: *Deleted

## 2015-08-20 ENCOUNTER — Ambulatory Visit (INDEPENDENT_AMBULATORY_CARE_PROVIDER_SITE_OTHER): Payer: Self-pay | Admitting: Orthopedic Surgery

## 2015-08-20 VITALS — Ht <= 58 in | Wt 95.0 lb

## 2015-08-20 DIAGNOSIS — N3000 Acute cystitis without hematuria: Secondary | ICD-10-CM

## 2015-08-20 DIAGNOSIS — S72002D Fracture of unspecified part of neck of left femur, subsequent encounter for closed fracture with routine healing: Secondary | ICD-10-CM

## 2015-08-20 LAB — URINE CULTURE: CULTURE: NO GROWTH

## 2015-08-20 NOTE — Patient Instructions (Signed)
WEIGHT BEARING AS TOLERATED.

## 2015-08-20 NOTE — Telephone Encounter (Signed)
REFERRAL FAXED TO [facility] FOR [place] LOCATION

## 2015-08-20 NOTE — Progress Notes (Signed)
Patient ID: Kathryn Hamilton, female   DOB: 05/01/1931, 81 y.o.   MRN: [id number]  Follow up visit  Chief Complaint  Patient presents with  . Follow-up    4 week follow up left hip fracture, DOS 07/16/15    Ht 4\' 10"  (1.473 m)  Wt 95 lb (43.092 kg)  BMI 19.86 kg/m2  Encounter Diagnoses  Name Primary?  . Closed fracture of neck of left femur with routine healing, subsequent encounter Yes  . Acute cystitis without hematuria      4 weeks postop cannulated screw fixation left hip  I got new x-rays at the hospital pelvis and AP lateral left hip. 3 screws inverted triangle configuration fracture apposition is excellent. Screw position excellent.  Patient stood and walked for me today with maximum assist from her relative  Main complaint complaint is burning with urination. The family is concerned that they haven't seen a doctor about it so we will get urology consult with the [facility] for workup  See me 7 weeks repeat x-ray in the office continue weightbearing as tolerated

## 2015-08-29 NOTE — Telephone Encounter (Signed)
appt 10/09/15 @ 10am w/ Dr Eulogio Ditch

## 2015-08-31 ENCOUNTER — Other Ambulatory Visit (HOSPITAL_COMMUNITY)
Admission: RE | Admit: 2015-08-31 | Discharge: 2015-08-31 | Disposition: A | Discharge status: Home / Self Care | Payer: Medicare Other | Source: Skilled Nursing Facility | Attending: Internal Medicine | Admitting: Internal Medicine

## 2015-08-31 ENCOUNTER — Non-Acute Institutional Stay (SKILLED_NURSING_FACILITY): Payer: Medicare Other | Admitting: Internal Medicine

## 2015-08-31 DIAGNOSIS — F0280 Dementia in other diseases classified elsewhere without behavioral disturbance: Secondary | ICD-10-CM

## 2015-08-31 DIAGNOSIS — M6281 Muscle weakness (generalized): Secondary | ICD-10-CM | POA: Insufficient documentation

## 2015-08-31 DIAGNOSIS — E785 Hyperlipidemia, unspecified: Secondary | ICD-10-CM | POA: Diagnosis present

## 2015-08-31 DIAGNOSIS — N939 Abnormal uterine and vaginal bleeding, unspecified: Secondary | ICD-10-CM | POA: Diagnosis not present

## 2015-08-31 DIAGNOSIS — G309 Alzheimer's disease, unspecified: Secondary | ICD-10-CM | POA: Diagnosis not present

## 2015-08-31 DIAGNOSIS — S72002D Fracture of unspecified part of neck of left femur, subsequent encounter for closed fracture with routine healing: Secondary | ICD-10-CM

## 2015-08-31 LAB — CBC WITH DIFFERENTIAL/PLATELET
BASOS PCT: 0 %
Basophils Absolute: 0 10*3/uL (ref 0.0–0.1)
EOS ABS: 0.2 10*3/uL (ref 0.0–0.7)
Eosinophils Relative: 2 %
HCT: 32.4 % — ABNORMAL LOW (ref 36.0–46.0)
HEMOGLOBIN: 10.6 g/dL — AB (ref 12.0–15.0)
LYMPHS ABS: 1.1 10*3/uL (ref 0.7–4.0)
Lymphocytes Relative: 17 %
MCH: 31 pg (ref 26.0–34.0)
MCHC: 32.7 g/dL (ref 30.0–36.0)
MCV: 94.7 fL (ref 78.0–100.0)
Monocytes Absolute: 0.6 10*3/uL (ref 0.1–1.0)
Monocytes Relative: 10 %
NEUTROS PCT: 71 %
Neutro Abs: 4.6 10*3/uL (ref 1.7–7.7)
PLATELETS: 199 10*3/uL (ref 150–400)
RBC: 3.42 MIL/uL — AB (ref 3.87–5.11)
RDW: 14.7 % (ref 11.5–15.5)
WBC: 6.4 10*3/uL (ref 4.0–10.5)

## 2015-08-31 LAB — COMPREHENSIVE METABOLIC PANEL
ALBUMIN: 2.7 g/dL — AB (ref 3.5–5.0)
ALK PHOS: 85 U/L (ref 38–126)
ALT: 11 U/L — AB (ref 14–54)
AST: 19 U/L (ref 15–41)
Anion gap: 8 (ref 5–15)
BUN: 52 mg/dL — ABNORMAL HIGH (ref 6–20)
CALCIUM: 9.2 mg/dL (ref 8.9–10.3)
CHLORIDE: 107 mmol/L (ref 101–111)
CO2: 24 mmol/L (ref 22–32)
CREATININE: 1.2 mg/dL — AB (ref 0.44–1.00)
GFR calc Af Amer: 47 mL/min — ABNORMAL LOW (ref >60)
GFR calc non Af Amer: 41 mL/min — ABNORMAL LOW (ref >60)
GLUCOSE: 132 mg/dL — AB (ref 65–99)
Potassium: 4.3 mmol/L (ref 3.5–5.1)
SODIUM: 139 mmol/L (ref 135–145)
Total Bilirubin: 0.7 mg/dL (ref 0.3–1.2)
Total Protein: 5.5 g/dL — ABNORMAL LOW (ref 6.5–8.1)

## 2015-08-31 NOTE — Progress Notes (Signed)
Patient ID: Kathryn Hamilton, female   DOB: Feb 13, 1931, 81 y.o.   MRN: [id number]     This is an acute visit.  Level care skilled.  Facility [facility].  Chief complaint-acute visit secondary to suspected vaginal bleeding  History of present illness.  Patient is an 81 year old female with a history of advanced dementia who was found on the floor after she apparently rolled out of bed-she had an impacted left femoral neck fracture-and underwent an ORIF of the left hip on October 3.  She had an unremarkable postop course she is here for therapy.  During her stay there has been  What was thought  bloody urine noted this was thought originally possibly to be hematuria-urine culture actually has been negative however.  This morning nursing noted apparently  blood in her diaper--apparently noted a moderate amount of vaginal bleeding per nursing notification-although on observation today there is no active vaginal bleeding at this time  Patient does not appear uncomfortable even during those episodes per nursing-her vital signs are stable she cannot really give any review of systems secondary to severe dementia  Previous medical history.  Close left hip fracture with repair.  Hyperlipidemia with LDL goal less than 100.  Alzheimer's disease.  Osteoporosis.  Left displaced femoral neck fracture.  History of fall.  Surgical history-previous history of colon surgery.  Medications.  Aspirin enteric-coated 325 mg daily.  Ativan 0.5 mg daily at bedtime.  Protonic 20 mg daily.  Tramadol 50 mg every 6 hours.  Lipitor 40 mg daily.  Calcium carbonate 600 mg 1 tab by mouth daily.  Vitamin D3 1000 units daily.  Namenda 10 mg twice a day.  Niaspan 500 mg daily at bedtime.  .  Social history patient ex-smoker quit about 42 years ago no smokeless tobacco or significant alcohol or illicit drug history.  Family history significant for heart disease in her father.  Review  of systems essentially unobtainable secondary to severe dementia.  Physical exam. Temperature 97.9 pulse 80 respirations 16 blood pressure 134/79  In general this is a frail elderly female she is not in any distress.  Her skin is warm and dry surgical site left hip per nursing is benign appearing healing unremarkably I do not see any sign of infection or bleeding  Eyes pupils appear reactive to light sclera and conjunctiva clear visual acuity appears grossly intact.  Oropharynx difficult to assess since patient would not open her mouth.  Chest is clear to auscultation with poor respiratory effort.  Heart regular rate and rhythm without murmur gallop or rub she does have positive pedal pulses and no significant lower extremity edema.  Her abdomen is soft  positive bowel sounds.--I do not note any acute tenderness to palpation-she does have some reaction to the invasive maneuver palpating her abdomen and suprapubic area but this does not appear to be overt tenderness  GU cannot really appreciate any active vaginal bleeding at this time there is blood in the diaper-.  Rectal-Hemoccult blood testing was negative this morning  Muscle skeletal moves all extremes 4 were limited left lower extremity secondary to surgery I do not note any deformities other than age-related arthritic changes.  Neurologic she is alert cannot appreciate any focal deficits difficult exam since patient appears to have severe dementia.  Again she does psych-wise have severe dementia.    LABS  07/19/2015.  Sodium 134 potassium 3.6 BUN 14 creatinine 0.88.  WBC 8.5 hemoglobin 12.1 platelets 151.  Assessment and plan.  History of vaginal bleeding-per chart review this appears to be relatively new-patient does not appear to be in any distress we will need to check her blood work and see where her hemoglobin stands-also will order a metabolic panel.  Pending family approval will order a GYN consult if they  desire-suspect with severe dementia aggressive workup would probably be a challenge.  Consider reducing or holding her aspirin will await blood work to see where we stand with the hemoglobin.  Clinically she appears to be at baseline  Regards to left hip repair this appears to be fairly unremarkable she has been followed by orthopedics  She is currently on aspirin 325 mg a day for DVT prophylaxis although it appears she's completed at least a four-week course of this which will give [place] a low threshold for discontinuing this with history  Bleeding  [id number]

## 2015-09-01 ENCOUNTER — Encounter: Payer: Self-pay | Admitting: Internal Medicine

## 2015-09-05 ENCOUNTER — Other Ambulatory Visit (HOSPITAL_COMMUNITY)
Admission: RE | Admit: 2015-09-05 | Discharge: 2015-09-05 | Disposition: A | Discharge status: Home / Self Care | Payer: Medicare Other | Source: Ambulatory Visit | Attending: Obstetrics and Gynecology | Admitting: Obstetrics and Gynecology

## 2015-09-05 ENCOUNTER — Encounter: Payer: Self-pay | Admitting: Obstetrics and Gynecology

## 2015-09-05 ENCOUNTER — Ambulatory Visit (INDEPENDENT_AMBULATORY_CARE_PROVIDER_SITE_OTHER): Payer: Medicare Other | Admitting: Obstetrics and Gynecology

## 2015-09-05 VITALS — BP 100/60 | Wt 87.0 lb

## 2015-09-05 DIAGNOSIS — N362 Urethral caruncle: Secondary | ICD-10-CM

## 2015-09-05 DIAGNOSIS — Z1151 Encounter for screening for human papillomavirus (HPV): Secondary | ICD-10-CM | POA: Diagnosis present

## 2015-09-05 DIAGNOSIS — N939 Abnormal uterine and vaginal bleeding, unspecified: Secondary | ICD-10-CM | POA: Diagnosis not present

## 2015-09-05 DIAGNOSIS — Z01419 Encounter for gynecological examination (general) (routine) without abnormal findings: Secondary | ICD-10-CM | POA: Diagnosis present

## 2015-09-05 NOTE — Progress Notes (Signed)
    [facility] Visit  Patient name: Kathryn Hamilton MRN [id number]  Date of birth: 1931/04/21  CC & HPI:  Kathryn Hamilton is a 81 y.o. female presenting today for intermittent vaginal bleeding onset 4 weeks. Pt's daughter states there were small blood clots present in the toilet bowl after urinating. Pt was intially diagnosed with a urinary tract infection and put on Cipro with no relief. Per pt's daughter she  takes a baby aspirin 1x daily.   ROS:  10 Systems reviewed and all are negative for acute change except as noted in the HPI.  Pertinent History Reviewed:   Reviewed: Significant for alzheimer's Medical         Past Medical History  Diagnosis Date  . Osteoporosis   . Alzheimer disease   . HLD (hyperlipidemia)   . Colon cancer [facility]) 1996                              Surgical Hx:    Past Surgical History  Procedure Laterality Date  . Colon surgery    . Hip pinning,cannulated Left 07/16/2015    Procedure: CANNULATED HIP PINNING /INTERNAL FIXATION LEFT HIP;  Surgeon: Carole Civil, MD;  Location: [facility];  Service: Orthopedics;  Laterality: Left;   Medications: Reviewed & Updated - see associated section                      No current outpatient prescriptions on file.   Social History: Reviewed -  reports that she quit smoking about 42 years ago. She has never used smokeless tobacco.  Objective Findings:  Vitals: There were no vitals taken for this visit.  Physical Examination:  Pelvic - atrophic vaginal tissues, generous secretions from vagina. No visible lesions on difficult exam   Assessment & Plan:   A:  1. Urethral caruncle, with irritation  P:  1. Will give rx for Desitin or neosporin 2 forms completed for the nursing facility.   By signing my name below, I, Erling Conte, attest that this documentation has been prepared under the direction and in the presence of Jonnie Kind, MD. Electronically Signed: Erling Conte, ED Scribe.  09/05/2015. 2:22 PM.  I personally performed the services described in this documentation, which was SCRIBED in my presence. The recorded information has been reviewed and considered accurate. It has been edited as necessary during review. Jonnie Kind, MD

## 2015-09-05 NOTE — Addendum Note (Signed)
Addended by: Farley Ly on: 09/05/2015 02:39 PM   Modules accepted: Orders

## 2015-09-05 NOTE — Progress Notes (Signed)
Patient ID: Kathryn Hamilton, female   DOB: [birth year], 81 y.o.   MRN: [id number] Pt been seeing today for vaginal bleeding.

## 2015-09-10 LAB — CYTOLOGY - PAP

## 2015-10-09 ENCOUNTER — Ambulatory Visit (INDEPENDENT_AMBULATORY_CARE_PROVIDER_SITE_OTHER): Payer: Medicare Other | Admitting: Urology

## 2015-10-09 DIAGNOSIS — R310 Gross hematuria: Secondary | ICD-10-CM | POA: Diagnosis not present

## 2015-10-11 ENCOUNTER — Ambulatory Visit (INDEPENDENT_AMBULATORY_CARE_PROVIDER_SITE_OTHER): Payer: Self-pay | Admitting: Orthopedic Surgery

## 2015-10-11 ENCOUNTER — Inpatient Hospital Stay (INDEPENDENT_AMBULATORY_CARE_PROVIDER_SITE_OTHER): Payer: Medicare Other

## 2015-10-11 VITALS — Ht <= 58 in | Wt 95.0 lb

## 2015-10-11 DIAGNOSIS — S72002D Fracture of unspecified part of neck of left femur, subsequent encounter for closed fracture with routine healing: Secondary | ICD-10-CM

## 2015-10-11 NOTE — Progress Notes (Signed)
Fracture care follow-up  Date of surgery 07/16/2015  Procedure open treatment internal fixation left hip fracture with 3 cannulated screws  Patient ambulating well at the nursing home.  Today's x-rays show fracture healing hardware in good position  Clinically the leg looks good  Follow-up as needed

## 2015-10-29 ENCOUNTER — Ambulatory Visit (HOSPITAL_COMMUNITY): Payer: Medicare Other

## 2015-10-29 ENCOUNTER — Encounter (HOSPITAL_COMMUNITY): Payer: Self-pay

## 2015-10-29 ENCOUNTER — Emergency Department (HOSPITAL_COMMUNITY): Payer: Medicare Other

## 2015-10-29 ENCOUNTER — Inpatient Hospital Stay (HOSPITAL_COMMUNITY)
Admission: EM | Admit: 2015-10-29 | Discharge: 2015-11-02 | DRG: 481 | Disposition: A | Discharge status: Skilled Nursing Facility | Payer: Medicare Other | Attending: Orthopedic Surgery | Admitting: Orthopedic Surgery

## 2015-10-29 DIAGNOSIS — G309 Alzheimer's disease, unspecified: Secondary | ICD-10-CM | POA: Diagnosis present

## 2015-10-29 DIAGNOSIS — Z87891 Personal history of nicotine dependence: Secondary | ICD-10-CM

## 2015-10-29 DIAGNOSIS — S72001A Fracture of unspecified part of neck of right femur, initial encounter for closed fracture: Secondary | ICD-10-CM | POA: Diagnosis not present

## 2015-10-29 DIAGNOSIS — F0280 Dementia in other diseases classified elsewhere without behavioral disturbance: Secondary | ICD-10-CM | POA: Diagnosis present

## 2015-10-29 DIAGNOSIS — Z8249 Family history of ischemic heart disease and other diseases of the circulatory system: Secondary | ICD-10-CM

## 2015-10-29 DIAGNOSIS — Z9181 History of falling: Secondary | ICD-10-CM | POA: Diagnosis not present

## 2015-10-29 DIAGNOSIS — R319 Hematuria, unspecified: Secondary | ICD-10-CM | POA: Diagnosis not present

## 2015-10-29 DIAGNOSIS — E860 Dehydration: Secondary | ICD-10-CM | POA: Diagnosis present

## 2015-10-29 DIAGNOSIS — I248 Other forms of acute ischemic heart disease: Secondary | ICD-10-CM | POA: Diagnosis not present

## 2015-10-29 DIAGNOSIS — Y92129 Unspecified place in nursing home as the place of occurrence of the external cause: Secondary | ICD-10-CM

## 2015-10-29 DIAGNOSIS — M810 Age-related osteoporosis without current pathological fracture: Secondary | ICD-10-CM | POA: Diagnosis present

## 2015-10-29 DIAGNOSIS — N390 Urinary tract infection, site not specified: Secondary | ICD-10-CM | POA: Diagnosis present

## 2015-10-29 DIAGNOSIS — I509 Heart failure, unspecified: Secondary | ICD-10-CM | POA: Diagnosis not present

## 2015-10-29 DIAGNOSIS — M1990 Unspecified osteoarthritis, unspecified site: Secondary | ICD-10-CM | POA: Diagnosis present

## 2015-10-29 DIAGNOSIS — Z880 Allergy status to penicillin: Secondary | ICD-10-CM

## 2015-10-29 DIAGNOSIS — E785 Hyperlipidemia, unspecified: Secondary | ICD-10-CM | POA: Diagnosis present

## 2015-10-29 DIAGNOSIS — N289 Disorder of kidney and ureter, unspecified: Secondary | ICD-10-CM

## 2015-10-29 DIAGNOSIS — D649 Anemia, unspecified: Secondary | ICD-10-CM

## 2015-10-29 DIAGNOSIS — E876 Hypokalemia: Secondary | ICD-10-CM | POA: Diagnosis not present

## 2015-10-29 DIAGNOSIS — M25551 Pain in right hip: Secondary | ICD-10-CM | POA: Diagnosis present

## 2015-10-29 DIAGNOSIS — Z66 Do not resuscitate: Secondary | ICD-10-CM | POA: Diagnosis not present

## 2015-10-29 DIAGNOSIS — Z85038 Personal history of other malignant neoplasm of large intestine: Secondary | ICD-10-CM

## 2015-10-29 DIAGNOSIS — S72011A Unspecified intracapsular fracture of right femur, initial encounter for closed fracture: Principal | ICD-10-CM | POA: Diagnosis present

## 2015-10-29 DIAGNOSIS — W19XXXA Unspecified fall, initial encounter: Secondary | ICD-10-CM | POA: Diagnosis present

## 2015-10-29 DIAGNOSIS — S72001D Fracture of unspecified part of neck of right femur, subsequent encounter for closed fracture with routine healing: Secondary | ICD-10-CM | POA: Diagnosis not present

## 2015-10-29 DIAGNOSIS — R9431 Abnormal electrocardiogram [ECG] [EKG]: Secondary | ICD-10-CM | POA: Diagnosis present

## 2015-10-29 LAB — CBC WITH DIFFERENTIAL/PLATELET
BASOS ABS: 0 10*3/uL (ref 0.0–0.1)
BASOS PCT: 0 %
EOS ABS: 0 10*3/uL (ref 0.0–0.7)
EOS PCT: 0 %
HCT: 32.3 % — ABNORMAL LOW (ref 36.0–46.0)
Hemoglobin: 10.5 g/dL — ABNORMAL LOW (ref 12.0–15.0)
Lymphocytes Relative: 7 %
Lymphs Abs: 0.7 10*3/uL (ref 0.7–4.0)
MCH: 28.9 pg (ref 26.0–34.0)
MCHC: 32.5 g/dL (ref 30.0–36.0)
MCV: 89 fL (ref 78.0–100.0)
MONO ABS: 0.6 10*3/uL (ref 0.1–1.0)
Monocytes Relative: 7 %
Neutro Abs: 8 10*3/uL — ABNORMAL HIGH (ref 1.7–7.7)
Neutrophils Relative %: 86 %
PLATELETS: 247 10*3/uL (ref 150–400)
RBC: 3.63 MIL/uL — ABNORMAL LOW (ref 3.87–5.11)
RDW: 14.3 % (ref 11.5–15.5)
WBC: 9.4 10*3/uL (ref 4.0–10.5)

## 2015-10-29 LAB — URINALYSIS, ROUTINE W REFLEX MICROSCOPIC
BILIRUBIN URINE: NEGATIVE
GLUCOSE, UA: NEGATIVE mg/dL
Nitrite: POSITIVE — AB
PH: 7.5 (ref 5.0–8.0)
Protein, ur: >300 mg/dL — AB
SPECIFIC GRAVITY, URINE: 1.015 (ref 1.005–1.030)

## 2015-10-29 LAB — BASIC METABOLIC PANEL
ANION GAP: 8 (ref 5–15)
BUN: 52 mg/dL — ABNORMAL HIGH (ref 6–20)
CHLORIDE: 108 mmol/L (ref 101–111)
CO2: 23 mmol/L (ref 22–32)
Calcium: 9.4 mg/dL (ref 8.9–10.3)
Creatinine, Ser: 1.42 mg/dL — ABNORMAL HIGH (ref 0.44–1.00)
GFR calc Af Amer: 39 mL/min — ABNORMAL LOW (ref >60)
GFR, EST NON AFRICAN AMERICAN: 33 mL/min — AB (ref >60)
GLUCOSE: 162 mg/dL — AB (ref 65–99)
POTASSIUM: 4.6 mmol/L (ref 3.5–5.1)
SODIUM: 139 mmol/L (ref 135–145)

## 2015-10-29 LAB — PREPARE RBC (CROSSMATCH)

## 2015-10-29 LAB — URINE MICROSCOPIC-ADD ON

## 2015-10-29 MED ORDER — MORPHINE SULFATE (PF) 4 MG/ML IV SOLN
4.0000 mg | INTRAVENOUS | Status: DC | PRN
  Administered 2015-10-30: 4 mg via INTRAVENOUS
  Filled 2015-10-29: qty 1

## 2015-10-29 MED ORDER — SODIUM CHLORIDE 0.9 % IV SOLN
Freq: Once | INTRAVENOUS | Status: AC
  Administered 2015-10-29: 18:00:00 via INTRAVENOUS

## 2015-10-29 MED ORDER — MEMANTINE HCL 10 MG PO TABS
10.0000 mg | ORAL_TABLET | Freq: Two times a day (BID) | ORAL | Status: DC
Start: 2015-10-29 — End: 2015-11-02
  Administered 2015-10-29 – 2015-11-02 (×3): 10 mg via ORAL
  Filled 2015-10-29 (×3): qty 1

## 2015-10-29 MED ORDER — PANTOPRAZOLE SODIUM 40 MG PO TBEC
40.0000 mg | DELAYED_RELEASE_TABLET | Freq: Every day | ORAL | Status: DC
  Administered 2015-10-29 – 2015-11-02 (×2): 40 mg via ORAL
  Filled 2015-10-29 (×2): qty 1

## 2015-10-29 MED ORDER — ATORVASTATIN CALCIUM 40 MG PO TABS
40.0000 mg | ORAL_TABLET | Freq: Every day | ORAL | Status: DC
  Administered 2015-10-29 – 2015-11-01 (×2): 40 mg via ORAL
  Filled 2015-10-29 (×3): qty 1

## 2015-10-29 MED ORDER — HEPARIN SODIUM (PORCINE) 5000 UNIT/ML IJ SOLN
5000.0000 [IU] | Freq: Three times a day (TID) | INTRAMUSCULAR | Status: DC
  Administered 2015-10-29 – 2015-11-02 (×10): 5000 [IU] via SUBCUTANEOUS
  Filled 2015-10-29 (×10): qty 1

## 2015-10-29 MED ORDER — LORAZEPAM 0.5 MG PO TABS
0.5000 mg | ORAL_TABLET | Freq: Every day | ORAL | Status: DC
  Administered 2015-10-29 – 2015-11-01 (×2): 0.5 mg via ORAL
  Filled 2015-10-29 (×2): qty 1

## 2015-10-29 MED ORDER — DEXTROSE-NACL 5-0.9 % IV SOLN
INTRAVENOUS | Status: DC
  Administered 2015-10-29: 50 mL/h via INTRAVENOUS

## 2015-10-29 MED ORDER — ONDANSETRON HCL 4 MG/2ML IJ SOLN: 4.0000 mg | Freq: Three times a day (TID) | INTRAMUSCULAR | Status: AC | PRN

## 2015-10-29 MED ORDER — SODIUM CHLORIDE 0.9 % IJ SOLN
3.0000 mL | Freq: Two times a day (BID) | INTRAMUSCULAR | Status: DC
  Administered 2015-10-29: 3 mL via INTRAVENOUS

## 2015-10-29 MED ORDER — SENNA 8.6 MG PO TABS
1.0000 | ORAL_TABLET | Freq: Every day | ORAL | Status: DC
  Administered 2015-11-01 – 2015-11-02 (×2): 8.6 mg via ORAL
  Filled 2015-10-29 (×2): qty 1

## 2015-10-29 MED ORDER — SODIUM CHLORIDE 0.9 % IV SOLN: Freq: Once | INTRAVENOUS | Status: DC

## 2015-10-29 MED ORDER — ACETAMINOPHEN 325 MG PO TABS: 650.0000 mg | ORAL_TABLET | Freq: Four times a day (QID) | ORAL | Status: DC | PRN

## 2015-10-29 NOTE — H&P (Signed)
[facility] History and Physical  Kathryn Hamilton [id number] DOB: 1931/10/02    PCP:   Chevis Pretty, FNP   Chief Complaint: Right hip Fx.   HPI: Kathryn Hamilton is an 82 y.o. female with hx of advanced dementia, osteoporosis, HLD, lives at home with caretaker and her husband, whom I admitted 3 months ago for L hip Fx, repaired by Dr Aline Brochure, presented to the ER where she was found to have now a right closed hip Fx. Her head CT, cervical CT, hand, chest, and femur plain films were done and showed no acute Fx.  Her husband said she was not complained of pain yesterday, and staff reported that she fell.  Her husband, a retired Forensic psychologist, has POA/HCP, and indicated that she is a DNR. He also expressed wishes and gave verbal permission that she undergoes ORIF. Dr Aline Brochure of orthopedic surgery was consulted by EDP and is aware of her admission. Hospitalist was asked to admit her for same. She has had no CAD, HTN, DM, and had distant history of colon cancer.  Rewiew of Systems:  Unable.   Past Medical History  Diagnosis Date  . Osteoporosis   . Alzheimer disease   . HLD (hyperlipidemia)   . Colon cancer ([place]) 1996    Past Surgical History  Procedure Laterality Date  . Colon surgery    . Hip pinning,cannulated Left 07/16/2015    Procedure: CANNULATED HIP PINNING /INTERNAL FIXATION LEFT HIP;  Surgeon: Carole Civil, MD;  Location: [facility];  Service: Orthopedics;  Laterality: Left;    Medications:  HOME MEDS: Prior to Admission medications   Medication Sig Start Date End Date Taking? Authorizing Provider  acetaminophen (TYLENOL) 325 MG tablet Take 650 mg by mouth every 6 (six) hours as needed for mild pain or moderate pain.    Yes Historical Provider, MD  atorvastatin (LIPITOR) 40 MG tablet Take 1 tablet (40 mg total) by mouth daily. 04/23/15  Yes Mary-Margaret Hassell Done, FNP  calcium carbonate (OSCAL) 1500 (600 Ca) MG TABS tablet Take 1,500 mg by mouth daily.    Yes Historical Provider, MD  cholecalciferol (VITAMIN D) 1000 units tablet Take 1,000 Units by mouth daily.   Yes Historical Provider, MD  magnesium hydroxide (MILK OF MAGNESIA) 400 MG/5ML suspension Take 30 mLs by mouth daily as needed for mild constipation.   Yes Historical Provider, MD  memantine (NAMENDA) 10 MG tablet TAKE (1) TABLET TWICE A DAY. 04/23/15  Yes Mary-Margaret Hassell Done, FNP  omeprazole (PRILOSEC) 20 MG capsule Take 20 mg by mouth at bedtime.    Yes Historical Provider, MD  senna (SENOKOT) 8.6 MG TABS tablet Take 1 tablet by mouth daily.   Yes Historical Provider, MD  LORazepam (ATIVAN) 0.5 MG tablet Take 1 tablet (0.5 mg total) by mouth at bedtime. Patient not taking: Reported on 09/05/2015 07/18/15   Kathie Dike, MD  niacin (NIASPAN) 500 MG CR tablet Take 1 tablet (500 mg total) by mouth at bedtime. Patient not taking: Reported on 09/05/2015 04/23/15   Mary-Margaret Hassell Done, FNP  traMADol (ULTRAM) 50 MG tablet Take 1 tablet (50 mg total) by mouth every 6 (six) hours. Patient not taking: Reported on 09/05/2015 07/18/15   Kathie Dike, MD     Allergies:  Allergies  Allergen Reactions  . Penicillins Swelling    Tongue swells    Social History:   reports that she quit smoking about 42 years ago. She has never used smokeless tobacco. She reports that she does not drink alcohol  or use illicit drugs.  Family History: Family History  Problem Relation Age of Onset  . Heart disease Father      Physical Exam: Filed Vitals:   10/29/15 1604 10/29/15 1630  BP: 129/47 121/49  Pulse: 113 108  Temp: 98.6 F (37 C)   TempSrc: Rectal   Resp: 21 19  Height: 5\' 2"  (1.575 m)   Weight: 38.556 kg (85 lb)   SpO2: 94% 96%   Blood pressure 121/49, pulse 108, temperature 98.6 F (37 C), temperature source Rectal, resp. rate 19, height 5\' 2"  (1.575 m), weight 38.556 kg (85 lb), SpO2 96 %.  GEN:  Pleasant patient lying in the stretcher in no acute distress;  PSYCH:  does not  appear anxious or depressed; affect is appropriate. HEENT: Mucous membranes pink and anicteric; PERRLA; EOM intact; no cervical lymphadenopathy nor thyromegaly or carotid bruit; no JVD; There were no stridor. Neck is very supple. Breasts:: Not examined CHEST WALL: No tenderness CHEST: Normal respiration, clear to auscultation bilaterally.  HEART: Regular rate and rhythm.  There are no murmur, rub, or gallops.   BACK: No kyphosis or scoliosis; no CVA tenderness ABDOMEN: soft and non-tender; no masses, no organomegaly, normal abdominal bowel sounds; no pannus; no intertriginous candida. There is no rebound and no distention. Rectal Exam: Not done EXTREMITIES: No bone or joint deformity; age-appropriate arthropathy of the hands and knees; no edema; no ulcerations.  There is no calf tenderness. Genitalia: not examined PULSES: 2+ and symmetric SKIN: Normal hydration no rash or ulceration CNS: :Confused.    Labs on Admission:  Basic Metabolic Panel:  Recent Labs Lab 10/29/15 1618  NA 139  K 4.6  CL 108  CO2 23  GLUCOSE 162*  BUN 52*  CREATININE 1.42*  CALCIUM 9.4   CBC:  Recent Labs Lab 10/29/15 1618  WBC 9.4  NEUTROABS 8.0*  HGB 10.5*  HCT 32.3*  MCV 89.0  PLT 247   Radiological Exams on Admission: Dg Tibia/fibula Right  10/29/2015  CLINICAL DATA:  Right hip pain and bruising.  Fall. EXAM: RIGHT TIBIA AND FIBULA - 2 VIEW COMPARISON:  None. FINDINGS: Spurring along the tibial spine. No tubular fibular fracture identified. No discrete knee effusion. IMPRESSION: 1. No fracture of the tibia or fibula identified. Electronically Signed   By: Van Clines M.D.   On: 10/29/2015 14:43   Ct Head Wo Contrast  10/29/2015  CLINICAL DATA:  Status post multiple falls. EXAM: CT HEAD WITHOUT CONTRAST CT CERVICAL SPINE WITHOUT CONTRAST TECHNIQUE: Multidetector CT imaging of the head and cervical spine was performed following the standard protocol without intravenous contrast.  Multiplanar CT image reconstructions of the cervical spine were also generated. COMPARISON:  11/13/2013 FINDINGS: CT HEAD FINDINGS No mass effect or midline shift. No evidence of acute intracranial hemorrhage, or infarction. No abnormal extra-axial fluid collections. There is moderate brain parenchymal atrophy and chronic small vessel disease changes. Basal cisterns are preserved. No depressed skull fractures. There is polypoid mucosal thickening and air-fluid levels within all of the paranasal sinuses. CT CERVICAL SPINE FINDINGS There is no evidence for acute fracture or dislocation. Again seen are multilevel osteoarthritic changes and facet joint arthropathy of the cervical spine with stable grade 1 anterolisthesis of C4 on C5 and mild rotatory subluxation at C1-C2. Prevertebral soft tissues have a normal appearance. Lung apices have a normal appearance. IMPRESSION: No acute intracranial abnormality. Atrophy, chronic microvascular disease. Pansinusitis. No evidence of fracture of the cervical spine. Multilevel osteoarthritic changes of the cervical  spine with chronic 2 mm anterolisthesis of C4 on C5 and mild rotatory subluxation at C1-C2. Electronically Signed   By: Fidela Salisbury M.D.   On: 10/29/2015 17:40   Ct Cervical Spine Wo Contrast  10/29/2015  CLINICAL DATA:  Status post multiple falls. EXAM: CT HEAD WITHOUT CONTRAST CT CERVICAL SPINE WITHOUT CONTRAST TECHNIQUE: Multidetector CT imaging of the head and cervical spine was performed following the standard protocol without intravenous contrast. Multiplanar CT image reconstructions of the cervical spine were also generated. COMPARISON:  11/13/2013 FINDINGS: CT HEAD FINDINGS No mass effect or midline shift. No evidence of acute intracranial hemorrhage, or infarction. No abnormal extra-axial fluid collections. There is moderate brain parenchymal atrophy and chronic small vessel disease changes. Basal cisterns are preserved. No depressed skull fractures.  There is polypoid mucosal thickening and air-fluid levels within all of the paranasal sinuses. CT CERVICAL SPINE FINDINGS There is no evidence for acute fracture or dislocation. Again seen are multilevel osteoarthritic changes and facet joint arthropathy of the cervical spine with stable grade 1 anterolisthesis of C4 on C5 and mild rotatory subluxation at C1-C2. Prevertebral soft tissues have a normal appearance. Lung apices have a normal appearance. IMPRESSION: No acute intracranial abnormality. Atrophy, chronic microvascular disease. Pansinusitis. No evidence of fracture of the cervical spine. Multilevel osteoarthritic changes of the cervical spine with chronic 2 mm anterolisthesis of C4 on C5 and mild rotatory subluxation at C1-C2. Electronically Signed   By: Fidela Salisbury M.D.   On: 10/29/2015 17:40   Dg Chest Portable 1 View  10/29/2015  CLINICAL DATA:  Cough and shortness of breath. History of hip fracture. EXAM: PORTABLE CHEST 1 VIEW COMPARISON:  None. FINDINGS: The cardiac silhouette is enlarged. Mediastinal contours appear intact. There is calcified atherosclerotic disease of the aorta. There is no evidence of focal airspace consolidation, pleural effusion or pneumothorax. There is mild pulmonary vascular congestion. Osseous structures are without acute abnormality. Soft tissues are grossly normal. IMPRESSION: Enlarged cardiac silhouette. Mild pulmonary vascular congestion. No evidence of focal airspace consolidation. Electronically Signed   By: Fidela Salisbury M.D.   On: 10/29/2015 16:30   Dg Hand Complete Right  10/29/2015  CLINICAL DATA:  Right hand pain and bruising status post fall. EXAM: RIGHT HAND - COMPLETE 3+ VIEW COMPARISON:  None. FINDINGS: There is advanced osteopenia. There is no evidence of acute displaced fracture, although in the settings of advanced osteopenia subtle fractures may be easily overlooked. There is apparent deformity of the radial styloid process, which may  represent sequela of prior trauma, advanced osteoarthritic changes or radio occult fracture. There is a chronic appearing deformity of the head of the fifth metacarpal, which may be due to prior injury. Advanced arthropathy is seen involving the intercarpal joints, carpometacarpal, radiocarpal, and interphalangeal joints. There is a soft tissue swelling of the wrist. IMPRESSION: Advanced osteopenia. Apparent deformity of the radial styloid process, which may represent sequela of prior trauma, advanced osteoarthritic changes or radio occult fracture. Chronic appearing deformity of the head of the fifth proximal phalanx. Advanced arthropathy involving the intercarpal joints, carpometacarpal, radiocarpal, and interphalangeal joints. Soft tissue swelling of the wrist. Electronically Signed   By: Fidela Salisbury M.D.   On: 10/29/2015 14:50   Dg Hip Unilat With Pelvis 2-3 Views Right  10/29/2015  CLINICAL DATA:  Right-sided pain and bruising along the hip and femur over the past 2 days. Fall. EXAM: DG HIP (WITH OR WITHOUT PELVIS) 2-3V RIGHT COMPARISON:  None. FINDINGS: Subcapital right hip fracture with associated  impaction. Bony demineralization suggesting osteoporosis. Deformity from prior left hip fracture with cannulated screws in place. Mild prominence of gas and stool in the distal colon. IMPRESSION: 1. Subcapital right hip fracture with associated impaction. 2. Bony demineralization favoring osteoporosis. 3. Old left hip fracture with cannulated screws in place. 4. Prominence of gas and stool in the distal large bowel. Electronically Signed   By: Van Clines M.D.   On: 10/29/2015 14:42   Dg Femur, Min 2 Views Right  10/29/2015  CLINICAL DATA:  Fall with right hip and upper leg pain. EXAM: RIGHT FEMUR 2 VIEWS COMPARISON:  None. FINDINGS: Subcapital right femoral neck fracture with resulting deformity and likely some rotation of the femoral head with respect to the femoral neck. Bony  demineralization. No other femur fracture is identified. IMPRESSION: 1. Subcapital femoral neck fracture on the right. Electronically Signed   By: Van Clines M.D.   On: 10/29/2015 14:44    EKG: Independently reviewed.    Assessment/Plan Present on Admission:  . Alzheimer's disease . Osteoporosis  Right hip Fx  ###  DNR   HLD HTN  PLAN:  Will admit her for closed right hip Fx.  Will make NPO after midnight anticipating ORIF by Dr Aline Brochure tomorrow. She will be given IV pain meds.  She has been with DNR code status, with the golden Rod, and I have confirmed with her husband who has been at her bedside today as well.  Thank you for allowing me to participate in her care.   Good Day.   Other plans as per orders. Code Status: Oda Kilts, MD. FACP [facility] Pager [phone] 7pm to 7am.  10/29/2015, 6:14 PM

## 2015-10-29 NOTE — ED Notes (Signed)
Family reports pt fell in oct and fractured left hip and went to [facility].  Family says pt has alzheimer's and fell multiple times yesterday.  Staff reports pt has a R femur fracture and swelling and bruising to r hand.  Pt sent here for eval.

## 2015-10-29 NOTE — ED Notes (Signed)
Caregiver reports pt has had a cough for the past 2 weeks.

## 2015-10-29 NOTE — ED Provider Notes (Signed)
CSN: [id number]     Arrival date & time 10/29/15  1558 History   First MD Initiated Contact with Patient 10/29/15 1603     Chief Complaint  Patient presents with  . Hip Injury     (Consider location/radiation/quality/duration/timing/severity/associated sxs/prior Treatment) The history is provided by the nursing home and a relative. The history is limited by the condition of the patient (Dementia).  82 year old female is reported to have fallen several times yesterday. X-rays taken at the nursing home revealed a right femoral neck fracture. She recently had open reduction internal fixation of a left hip fracture. Bruising was noted to her right hand but x-rays did not show any acute bony injury. She is not able to give any history.  Past Medical History  Diagnosis Date  . Osteoporosis   . Alzheimer disease   . HLD (hyperlipidemia)   . Colon cancer ([facility]) 1996   Past Surgical History  Procedure Laterality Date  . Colon surgery    . Hip pinning,cannulated Left 07/16/2015    Procedure: CANNULATED HIP PINNING /INTERNAL FIXATION LEFT HIP;  Surgeon: Carole Civil, MD;  Location: [facility];  Service: Orthopedics;  Laterality: Left;   Family History  Problem Relation Age of Onset  . Heart disease Father    Social History  Substance Use Topics  . Smoking status: Former Smoker    Quit date: 01/31/1973  . Smokeless tobacco: Never Used  . Alcohol Use: No   OB History    No data available     Review of Systems  Unable to perform ROS: Dementia      Allergies  Penicillins  Home Medications   Prior to Admission medications   Medication Sig Start Date End Date Taking? Authorizing Provider  acetaminophen (TYLENOL) 325 MG tablet Take 650 mg by mouth every 6 (six) hours as needed.    Historical Provider, MD  atorvastatin (LIPITOR) 40 MG tablet Take 1 tablet (40 mg total) by mouth daily. 04/23/15   Mary-Margaret Hassell Done, FNP  Calcium Carbonate (CALCIUM 600 PO) Take by mouth.     Historical Provider, MD  Cholecalciferol (VITAMIN D-3) 1000 UNITS CAPS Take by mouth.    Historical Provider, MD  LORazepam (ATIVAN) 0.5 MG tablet Take 1 tablet (0.5 mg total) by mouth at bedtime. Patient not taking: Reported on 09/05/2015 07/18/15   Kathie Dike, MD  memantine (NAMENDA) 10 MG tablet TAKE (1) TABLET TWICE A DAY. 04/23/15   Mary-Margaret Hassell Done, FNP  niacin (NIASPAN) 500 MG CR tablet Take 1 tablet (500 mg total) by mouth at bedtime. Patient not taking: Reported on 09/05/2015 04/23/15   Mary-Margaret Hassell Done, FNP  omeprazole (PRILOSEC) 20 MG capsule Take 20 mg by mouth daily.    Historical Provider, MD  traMADol (ULTRAM) 50 MG tablet Take 1 tablet (50 mg total) by mouth every 6 (six) hours. Patient not taking: Reported on 09/05/2015 07/18/15   Kathie Dike, MD   BP 129/47 mmHg  Pulse 113  Temp(Src) 98.6 F (37 C) (Rectal)  Resp 21  Ht 5\' 2"  (1.575 m)  Wt 85 lb (38.556 kg)  BMI 15.54 kg/m2  SpO2 94% Physical Exam  Nursing note and vitals reviewed.  82 year old female, resting comfortably and in no acute distress. Vital signs are significant for tachycardia and borderline tachypnea. Oxygen saturation is 94%, which is normal. Head is normocephalic and atraumatic. PERRLA, EOMI. Oropharynx is clear. Neck is nontender without adenopathy or JVD. Back is nontender and there is no CVA tenderness. Lungs are  clear without rales, wheezes, or rhonchi. Chest is nontender. Heart has regular rate and rhythm without murmur. Abdomen is soft, flat, nontender without masses or hepatosplenomegaly and peristalsis is normoactive. Extremities have no cyanosis or edema. There is pain with passive range of motion of the right hip. Skin is warm and dry without rash. Neurologic: she is awake and alert but nonverbal and does not follow commands. Cranial nerves are intact There he has markedly increased muscle tone diffusely.  ED Course  Procedures (including critical care time) Labs  Review Results for orders placed or performed during the hospital encounter of [id number]  Basic metabolic panel  Result Value Ref Range   Sodium 139 135 - 145 mmol/L   Potassium 4.6 3.5 - 5.1 mmol/L   Chloride 108 101 - 111 mmol/L   CO2 23 22 - 32 mmol/L   Glucose, Bld 162 (H) 65 - 99 mg/dL   BUN 52 (H) 6 - 20 mg/dL   Creatinine, Ser 1.42 (H) 0.44 - 1.00 mg/dL   Calcium 9.4 8.9 - 10.3 mg/dL   GFR calc non Af Amer 33 (L) >60 mL/min   GFR calc Af Amer 39 (L) >60 mL/min   Anion gap 8 5 - 15  CBC with Differential  Result Value Ref Range   WBC 9.4 4.0 - 10.5 K/uL   RBC 3.63 (L) 3.87 - 5.11 MIL/uL   Hemoglobin 10.5 (L) 12.0 - 15.0 g/dL   HCT 32.3 (L) 36.0 - 46.0 %   MCV 89.0 78.0 - 100.0 fL   MCH 28.9 26.0 - 34.0 pg   MCHC 32.5 30.0 - 36.0 g/dL   RDW 14.3 11.5 - 15.5 %   Platelets 247 150 - 400 K/uL   Neutrophils Relative % 86 %   Neutro Abs 8.0 (H) 1.7 - 7.7 K/uL   Lymphocytes Relative 7 %   Lymphs Abs 0.7 0.7 - 4.0 K/uL   Monocytes Relative 7 %   Monocytes Absolute 0.6 0.1 - 1.0 K/uL   Eosinophils Relative 0 %   Eosinophils Absolute 0.0 0.0 - 0.7 K/uL   Basophils Relative 0 %   Basophils Absolute 0.0 0.0 - 0.1 K/uL   Imaging Review Dg Tibia/fibula Right  10/29/2015  CLINICAL DATA:  Right hip pain and bruising.  Fall. EXAM: RIGHT TIBIA AND FIBULA - 2 VIEW COMPARISON:  None. FINDINGS: Spurring along the tibial spine. No tubular fibular fracture identified. No discrete knee effusion. IMPRESSION: 1. No fracture of the tibia or fibula identified. Electronically Signed   By: Van Clines M.D.   On: 10/29/2015 14:43   Ct Head Wo Contrast  10/29/2015  CLINICAL DATA:  Status post multiple falls. EXAM: CT HEAD WITHOUT CONTRAST CT CERVICAL SPINE WITHOUT CONTRAST TECHNIQUE: Multidetector CT imaging of the head and cervical spine was performed following the standard protocol without intravenous contrast. Multiplanar CT image reconstructions of the cervical spine were also generated.  COMPARISON:  11/13/2013 FINDINGS: CT HEAD FINDINGS No mass effect or midline shift. No evidence of acute intracranial hemorrhage, or infarction. No abnormal extra-axial fluid collections. There is moderate brain parenchymal atrophy and chronic small vessel disease changes. Basal cisterns are preserved. No depressed skull fractures. There is polypoid mucosal thickening and air-fluid levels within all of the paranasal sinuses. CT CERVICAL SPINE FINDINGS There is no evidence for acute fracture or dislocation. Again seen are multilevel osteoarthritic changes and facet joint arthropathy of the cervical spine with stable grade 1 anterolisthesis of C4 on C5 and mild rotatory subluxation at  C1-C2. Prevertebral soft tissues have a normal appearance. Lung apices have a normal appearance. IMPRESSION: No acute intracranial abnormality. Atrophy, chronic microvascular disease. Pansinusitis. No evidence of fracture of the cervical spine. Multilevel osteoarthritic changes of the cervical spine with chronic 2 mm anterolisthesis of C4 on C5 and mild rotatory subluxation at C1-C2. Electronically Signed   By: Fidela Salisbury M.D.   On: 10/29/2015 17:40   Ct Cervical Spine Wo Contrast  10/29/2015  CLINICAL DATA:  Status post multiple falls. EXAM: CT HEAD WITHOUT CONTRAST CT CERVICAL SPINE WITHOUT CONTRAST TECHNIQUE: Multidetector CT imaging of the head and cervical spine was performed following the standard protocol without intravenous contrast. Multiplanar CT image reconstructions of the cervical spine were also generated. COMPARISON:  11/13/2013 FINDINGS: CT HEAD FINDINGS No mass effect or midline shift. No evidence of acute intracranial hemorrhage, or infarction. No abnormal extra-axial fluid collections. There is moderate brain parenchymal atrophy and chronic small vessel disease changes. Basal cisterns are preserved. No depressed skull fractures. There is polypoid mucosal thickening and air-fluid levels within all of the  paranasal sinuses. CT CERVICAL SPINE FINDINGS There is no evidence for acute fracture or dislocation. Again seen are multilevel osteoarthritic changes and facet joint arthropathy of the cervical spine with stable grade 1 anterolisthesis of C4 on C5 and mild rotatory subluxation at C1-C2. Prevertebral soft tissues have a normal appearance. Lung apices have a normal appearance. IMPRESSION: No acute intracranial abnormality. Atrophy, chronic microvascular disease. Pansinusitis. No evidence of fracture of the cervical spine. Multilevel osteoarthritic changes of the cervical spine with chronic 2 mm anterolisthesis of C4 on C5 and mild rotatory subluxation at C1-C2. Electronically Signed   By: Fidela Salisbury M.D.   On: 10/29/2015 17:40   Dg Chest Portable 1 View  10/29/2015  CLINICAL DATA:  Cough and shortness of breath. History of hip fracture. EXAM: PORTABLE CHEST 1 VIEW COMPARISON:  None. FINDINGS: The cardiac silhouette is enlarged. Mediastinal contours appear intact. There is calcified atherosclerotic disease of the aorta. There is no evidence of focal airspace consolidation, pleural effusion or pneumothorax. There is mild pulmonary vascular congestion. Osseous structures are without acute abnormality. Soft tissues are grossly normal. IMPRESSION: Enlarged cardiac silhouette. Mild pulmonary vascular congestion. No evidence of focal airspace consolidation. Electronically Signed   By: Fidela Salisbury M.D.   On: 10/29/2015 16:30   Dg Hand Complete Right  10/29/2015  CLINICAL DATA:  Right hand pain and bruising status post fall. EXAM: RIGHT HAND - COMPLETE 3+ VIEW COMPARISON:  None. FINDINGS: There is advanced osteopenia. There is no evidence of acute displaced fracture, although in the settings of advanced osteopenia subtle fractures may be easily overlooked. There is apparent deformity of the radial styloid process, which may represent sequela of prior trauma, advanced osteoarthritic changes or radio  occult fracture. There is a chronic appearing deformity of the head of the fifth metacarpal, which may be due to prior injury. Advanced arthropathy is seen involving the intercarpal joints, carpometacarpal, radiocarpal, and interphalangeal joints. There is a soft tissue swelling of the wrist. IMPRESSION: Advanced osteopenia. Apparent deformity of the radial styloid process, which may represent sequela of prior trauma, advanced osteoarthritic changes or radio occult fracture. Chronic appearing deformity of the head of the fifth proximal phalanx. Advanced arthropathy involving the intercarpal joints, carpometacarpal, radiocarpal, and interphalangeal joints. Soft tissue swelling of the wrist. Electronically Signed   By: Fidela Salisbury M.D.   On: 10/29/2015 14:50   Dg Hip Unilat With Pelvis 2-3 Views Right  10/29/2015  CLINICAL DATA:  Right-sided pain and bruising along the hip and femur over the past 2 days. Fall. EXAM: DG HIP (WITH OR WITHOUT PELVIS) 2-3V RIGHT COMPARISON:  None. FINDINGS: Subcapital right hip fracture with associated impaction. Bony demineralization suggesting osteoporosis. Deformity from prior left hip fracture with cannulated screws in place. Mild prominence of gas and stool in the distal colon. IMPRESSION: 1. Subcapital right hip fracture with associated impaction. 2. Bony demineralization favoring osteoporosis. 3. Old left hip fracture with cannulated screws in place. 4. Prominence of gas and stool in the distal large bowel. Electronically Signed   By: Van Clines M.D.   On: 10/29/2015 14:42   Dg Femur, Min 2 Views Right  10/29/2015  CLINICAL DATA:  Fall with right hip and upper leg pain. EXAM: RIGHT FEMUR 2 VIEWS COMPARISON:  None. FINDINGS: Subcapital right femoral neck fracture with resulting deformity and likely some rotation of the femoral head with respect to the femoral neck. Bony demineralization. No other femur fracture is identified. IMPRESSION: 1. Subcapital femoral  neck fracture on the right. Electronically Signed   By: Van Clines M.D.   On: 10/29/2015 14:44   I have personally reviewed and evaluated these images and lab results as part of my medical decision-making.   EKG Interpretation   Date/Time:  Monday October 29 2015 16:40:30 EST Ventricular Rate:  105 PR Interval:  277 QRS Duration: 128 QT Interval:  387 QTC Calculation: 511 R Axis:   157 Text Interpretation:  Sinus tachycardia Ventricular premature complex  Prolonged PR interval Left atrial enlargement Lateral infarct, recent  Anterior infarct, acute (LAD) Prolonged QT interval When compared with ECG  of 07/15/2015, Rightward axis is now Present Confirmed by [facility]  MD, Jamaiyah Pyle  ([id number]) on 10/29/2015 5:09:41 PM      MDM   Final diagnoses:  Fall at nursing home, initial encounter  Closed displaced fracture of right femoral neck, initial encounter ([place])  Renal insufficiency  Normochromic normocytic anemia    Fall with right femoral neck fracture. Old records are reviewed confirming recent hospitalization for fracture of the left hip. Orthopedics will need to be consulted and she will need to be admitted for surgical management of her hip fracture.  CT of head and cervical spine showed no acute injury. She has chronic anemia and chronic renal insufficiency which are unchanged from baseline. Case has been discussed with Dr. Aline Brochure who states he will plan on surgery tomorrow. Case is discussed with Dr. Marin Comment [facility] who agrees to admit the patient.  Delora Fuel, MD [id number] [phone]

## 2015-10-30 DIAGNOSIS — N390 Urinary tract infection, site not specified: Secondary | ICD-10-CM

## 2015-10-30 DIAGNOSIS — S72001D Fracture of unspecified part of neck of right femur, subsequent encounter for closed fracture with routine healing: Secondary | ICD-10-CM

## 2015-10-30 DIAGNOSIS — S72001A Fracture of unspecified part of neck of right femur, initial encounter for closed fracture: Secondary | ICD-10-CM | POA: Diagnosis present

## 2015-10-30 DIAGNOSIS — G309 Alzheimer's disease, unspecified: Secondary | ICD-10-CM

## 2015-10-30 DIAGNOSIS — R9431 Abnormal electrocardiogram [ECG] [EKG]: Secondary | ICD-10-CM | POA: Diagnosis present

## 2015-10-30 DIAGNOSIS — F0280 Dementia in other diseases classified elsewhere without behavioral disturbance: Secondary | ICD-10-CM

## 2015-10-30 DIAGNOSIS — R319 Hematuria, unspecified: Secondary | ICD-10-CM

## 2015-10-30 LAB — COMPREHENSIVE METABOLIC PANEL
ALK PHOS: 69 U/L (ref 38–126)
ALT: 13 U/L — AB (ref 14–54)
AST: 15 U/L (ref 15–41)
Albumin: 2.4 g/dL — ABNORMAL LOW (ref 3.5–5.0)
Anion gap: 6 (ref 5–15)
BILIRUBIN TOTAL: 0.9 mg/dL (ref 0.3–1.2)
BUN: 33 mg/dL — ABNORMAL HIGH (ref 6–20)
CALCIUM: 8.7 mg/dL — AB (ref 8.9–10.3)
CO2: 23 mmol/L (ref 22–32)
CREATININE: 1 mg/dL (ref 0.44–1.00)
Chloride: 114 mmol/L — ABNORMAL HIGH (ref 101–111)
GFR, EST AFRICAN AMERICAN: 59 mL/min — AB (ref >60)
GFR, EST NON AFRICAN AMERICAN: 51 mL/min — AB (ref >60)
Glucose, Bld: 120 mg/dL — ABNORMAL HIGH (ref 65–99)
Potassium: 3.8 mmol/L (ref 3.5–5.1)
SODIUM: 143 mmol/L (ref 135–145)
TOTAL PROTEIN: 5.6 g/dL — AB (ref 6.5–8.1)

## 2015-10-30 LAB — GLUCOSE, CAPILLARY
GLUCOSE-CAPILLARY: 123 mg/dL — AB (ref 65–99)
Glucose-Capillary: 108 mg/dL — ABNORMAL HIGH (ref 65–99)
Glucose-Capillary: 121 mg/dL — ABNORMAL HIGH (ref 65–99)
Glucose-Capillary: 122 mg/dL — ABNORMAL HIGH (ref 65–99)

## 2015-10-30 LAB — CBC
HCT: 27.2 % — ABNORMAL LOW (ref 36.0–46.0)
Hemoglobin: 8.7 g/dL — ABNORMAL LOW (ref 12.0–15.0)
MCH: 28.8 pg (ref 26.0–34.0)
MCHC: 32 g/dL (ref 30.0–36.0)
MCV: 90.1 fL (ref 78.0–100.0)
PLATELETS: 202 10*3/uL (ref 150–400)
RBC: 3.02 MIL/uL — AB (ref 3.87–5.11)
RDW: 14.6 % (ref 11.5–15.5)
WBC: 6.6 10*3/uL (ref 4.0–10.5)

## 2015-10-30 LAB — SURGICAL PCR SCREEN
MRSA, PCR: NEGATIVE
STAPHYLOCOCCUS AUREUS: NEGATIVE

## 2015-10-30 LAB — TROPONIN I: Troponin I: 0.03 ng/mL (ref <0.031)

## 2015-10-30 MED ORDER — CHLORHEXIDINE GLUCONATE 4 % EX LIQD
Freq: Once | CUTANEOUS | Status: AC
  Administered 2015-10-31: 05:00:00 via TOPICAL

## 2015-10-30 MED ORDER — VANCOMYCIN HCL IN DEXTROSE 1-5 GM/200ML-% IV SOLN: 1000.0000 mg | INTRAVENOUS | Status: DC

## 2015-10-30 MED ORDER — VANCOMYCIN HCL IN DEXTROSE 1-5 GM/200ML-% IV SOLN
1000.0000 mg | Freq: Once | INTRAVENOUS | Status: AC
  Administered 2015-10-31: 1000 mg via INTRAVENOUS

## 2015-10-30 MED ORDER — SODIUM CHLORIDE 0.9 % IV SOLN
Freq: Once | INTRAVENOUS | Status: AC
  Administered 2015-10-30: 12:00:00 via INTRAVENOUS

## 2015-10-30 MED ORDER — CIPROFLOXACIN IN D5W 200 MG/100ML IV SOLN
200.0000 mg | INTRAVENOUS | Status: DC
  Administered 2015-10-30 – 2015-11-02 (×4): 200 mg via INTRAVENOUS
  Filled 2015-10-30 (×7): qty 100

## 2015-10-30 MED ORDER — CHLORHEXIDINE GLUCONATE 4 % EX LIQD
60.0000 mL | Freq: Once | CUTANEOUS | Status: DC
  Filled 2015-10-30: qty 60

## 2015-10-30 NOTE — Progress Notes (Signed)
Dr. [place] Pink aware that patient is difficult to arouse.

## 2015-10-30 NOTE — Progress Notes (Signed)
Spoke with son, who is in patient's room. Son states that patient has been drowsy and difficult to arouse since she fell. Patient responding to touch or pain, but only with minimal moaning.

## 2015-10-30 NOTE — Clinical Social Work Note (Signed)
Clinical Social Work Assessment  Patient Details  Name: Kathryn Hamilton MRN: [id number] Date of Birth: Apr 07, 1931  Date of referral:  10/30/15               Reason for consult:  Discharge Planning                Permission sought to share information with:    Permission granted to share information::     Name::        Agency::     Relationship::     Contact Information:     Housing/Transportation Living arrangements for the past 2 months:  [place] of Information:  Spouse Patient Interpreter Needed:  None Criminal Activity/Legal Involvement Pertinent to Current Situation/Hospitalization:  No - Comment as needed Significant Relationships:  Spouse Lives with:  Facility Resident Do you feel safe going back to the place where you live?  Yes Need for family participation in patient care:  Yes (Comment)  Care giving concerns:  Pt is long term resident at [facility].    Social Worker assessment / plan:  CSW attempted to meet with pt at bedside. Pt sleeping and is disoriented x4 per chart. Pt known to CSW from admission last fall due to hip fracture. Pt went to [facility] and is now private pay and nursing level of care. [name] is very involved and supportive and visits daily. Pt admitted again due to hip fracture and is scheduled for surgery tomorrow. Per Tami at [facility], pt is total care and okay to return. Facility anticipates pt will return skilled after surgery. Will continue to keep [facility] updated.  Employment status:  Retired Forensic scientist:  Medicare PT Recommendations:  Not assessed at this time [place] / Referral to community resources:  Other (Comment Required) (return to [facility])  Patient/Family's Response to care:  [name] agreeable to return to [facility] after surgery.   Patient/Family's Understanding of and Emotional Response to Diagnosis, Current Treatment, and Prognosis:  [name] is very involved in pt's care and aware of medical history. He shared  that he is very worried about his wife. Support provided.   Emotional Assessment Appearance:  Appears stated age Attitude/Demeanor/Rapport:  Unable to Assess Affect (typically observed):  Unable to Assess Orientation:    Alcohol / Substance use:  Not Applicable Psych involvement (Current and /or in the community):  No (Comment)  Discharge Needs  Concerns to be addressed:  Discharge Planning Concerns Readmission within the last 30 days:  No Current discharge risk:  Physical Impairment, Cognitively Impaired Barriers to Discharge:  Continued Medical Work up   Salome Arnt, [place] 10/30/2015, 9:23 AM [phone]

## 2015-10-30 NOTE — Progress Notes (Addendum)
PROGRESS NOTE  Kathryn Hamilton [id number] DOB: 12/12/1930 DOA: 10/29/2015 PCP: Chevis Pretty, FNP  HPI/Recap of past 24 hours: Patient is an 82 year old female with past medical history of Alzheimer's dementia, left hip fracture repair 3 months prior and continuou hematuria that has been ongoing now for several months who presents to the emergency room after a mechanical fall which led to a right sided hip fracture. In the emergency room, other scans including head, cervical spine or so all were negative. Patient found to have hematuria and UTI on urinalysis. EKG showed? Recent antero-lateral MI.  Following morning, patient having some pain. Seen by both prick surgery with plans for hip fracture repair on 1/18. Troponin normal. Patient is status post pain medication and somnolent and unable to give any kind of review of systems  Assessment/Plan: Principal Problem:   Closed fracture of neck of right femur ([place]): For surgery tomorrow Active Problems:   Dementia in Alzheimer's disease: Stable, continue Namenda. DO NOT RESUSCITATE   Osteoporosis   Hematuria: Unclear etiology. Patient has had workup by GYN and urology as outpatient. Completed course of antibiotics, suspect that this may be hemorrhagic cystitis. Will notify urology who plan to see patient in follow-up, but they were unable to make that appointment because of the snow last week.    DNR (do not resuscitate)   Urinary tract infection, site not specified: IV Cipro as patient is allergic to penicillin with swelling.   Nonspecific abnormal electrocardiogram (ECG) (EKG):? Recent ischemia. With normal troponin, advanced age and dementia and no wish for aggressive intervention, would not pursue further  Anemia: Patient slowly dehydrated on admission and with hydration, hemoglobin at 8.7. Receiving one unit of packed red blood cells prophylactically as per orthopedic surgery. Suspect her underlying cause of anemia is from  chronic blood loss from hematuria.   Code Status: DO NOT RESUSCITATE   Family Communication: Husband at the bedside   Disposition Plan: Surgery tomorrow and then skilled nursing likely band of week    Consultants:  Orthopedic surgery  Urology   Procedures:  Hip fracture repair planned for 1/18  1 unit packed red blood cell transfusion 1/70  Antibiotics:  IV Cipro 1/17-present   Objective: BP 148/58 mmHg  Pulse 113  Temp(Src) 98.7 F (37.1 C) (Axillary)  Resp 25  Ht 4\' 10"  (1.473 m)  Wt 39.191 kg (86 lb 6.4 oz)  BMI 18.06 kg/m2  SpO2 91%  Intake/Output Summary (Last 24 hours) at 10/30/15 1356 Last data filed at 10/30/15 1147  Gross per 24 hour  Intake    740 ml  Output    500 ml  Net    240 ml   Filed Weights   10/29/15 1604 10/29/15 2139  Weight: 38.556 kg (85 lb) 39.191 kg (86 lb 6.4 oz)    Exam:   General:  Somnolent   Cardiovascular: Regular rhythm, borderline tachycardia   Respiratory: Poor inspiratory effort, otherwise clear   Abdomen: Soft, nontender, nondistended, positive bowel sounds   Musculoskeletal: No edema    Data Reviewed: Basic Metabolic Panel:  Recent Labs Lab 10/29/15 1618 10/30/15 0644  NA 139 143  K 4.6 3.8  CL 108 114*  CO2 23 23  GLUCOSE 162* 120*  BUN 52* 33*  CREATININE 1.42* 1.00  CALCIUM 9.4 8.7*   Liver Function Tests:  Recent Labs Lab 10/30/15 0644  AST 15  ALT 13*  ALKPHOS 69  BILITOT 0.9  PROT 5.6*  ALBUMIN 2.4*   No results for  input(s): LIPASE, AMYLASE in the last 168 hours. No results for input(s): AMMONIA in the last 168 hours. CBC:  Recent Labs Lab 10/29/15 1618 10/30/15 0644  WBC 9.4 6.6  NEUTROABS 8.0*  --   HGB 10.5* 8.7*  HCT 32.3* 27.2*  MCV 89.0 90.1  PLT 247 202   Cardiac Enzymes:    Recent Labs Lab 10/30/15 0904  TROPONINI 0.03   BNP (last 3 results) No results for input(s): BNP in the last 8760 hours.  ProBNP (last 3 results) No results for input(s): PROBNP  in the last 8760 hours.  CBG:  Recent Labs Lab 10/30/15 0741 10/30/15 1153  GLUCAP 108* 121*    Recent Results (from the past 240 hour(s))  Urine culture     Status: None (Preliminary result)   Collection Time: 10/29/15  5:40 PM  Result Value Ref Range Status   Specimen Description URINE, CATHETERIZED  Final   Special Requests NONE  Final   Culture   Final    TOO YOUNG TO READ Performed at [facility]    Report Status PENDING  Incomplete  Surgical pcr screen     Status: None   Collection Time: 10/30/15  1:51 AM  Result Value Ref Range Status   MRSA, PCR NEGATIVE NEGATIVE Final   Staphylococcus aureus NEGATIVE NEGATIVE Final    Comment:        The Xpert SA Assay (FDA approved for NASAL specimens in patients over 60 years of age), is one component of a comprehensive surveillance program.  Test performance has been validated by [facility] for patients greater than or equal to 39 year old. It is not intended to diagnose infection nor to guide or monitor treatment.      Studies: Dg Tibia/fibula Right  10/29/2015  CLINICAL DATA:  Right hip pain and bruising.  Fall. EXAM: RIGHT TIBIA AND FIBULA - 2 VIEW COMPARISON:  None. FINDINGS: Spurring along the tibial spine. No tubular fibular fracture identified. No discrete knee effusion. IMPRESSION: 1. No fracture of the tibia or fibula identified. Electronically Signed   By: Van Clines M.D.   On: 10/29/2015 14:43   Ct Head Wo Contrast  10/29/2015  CLINICAL DATA:  Status post multiple falls. EXAM: CT HEAD WITHOUT CONTRAST CT CERVICAL SPINE WITHOUT CONTRAST TECHNIQUE: Multidetector CT imaging of the head and cervical spine was performed following the standard protocol without intravenous contrast. Multiplanar CT image reconstructions of the cervical spine were also generated. COMPARISON:  11/13/2013 FINDINGS: CT HEAD FINDINGS No mass effect or midline shift. No evidence of acute intracranial hemorrhage, or infarction.  No abnormal extra-axial fluid collections. There is moderate brain parenchymal atrophy and chronic small vessel disease changes. Basal cisterns are preserved. No depressed skull fractures. There is polypoid mucosal thickening and air-fluid levels within all of the paranasal sinuses. CT CERVICAL SPINE FINDINGS There is no evidence for acute fracture or dislocation. Again seen are multilevel osteoarthritic changes and facet joint arthropathy of the cervical spine with stable grade 1 anterolisthesis of C4 on C5 and mild rotatory subluxation at C1-C2. Prevertebral soft tissues have a normal appearance. Lung apices have a normal appearance. IMPRESSION: No acute intracranial abnormality. Atrophy, chronic microvascular disease. Pansinusitis. No evidence of fracture of the cervical spine. Multilevel osteoarthritic changes of the cervical spine with chronic 2 mm anterolisthesis of C4 on C5 and mild rotatory subluxation at C1-C2. Electronically Signed   By: Fidela Salisbury M.D.   On: 10/29/2015 17:40   Ct Cervical Spine Wo Contrast  10/29/2015  CLINICAL DATA:  Status post multiple falls. EXAM: CT HEAD WITHOUT CONTRAST CT CERVICAL SPINE WITHOUT CONTRAST TECHNIQUE: Multidetector CT imaging of the head and cervical spine was performed following the standard protocol without intravenous contrast. Multiplanar CT image reconstructions of the cervical spine were also generated. COMPARISON:  11/13/2013 FINDINGS: CT HEAD FINDINGS No mass effect or midline shift. No evidence of acute intracranial hemorrhage, or infarction. No abnormal extra-axial fluid collections. There is moderate brain parenchymal atrophy and chronic small vessel disease changes. Basal cisterns are preserved. No depressed skull fractures. There is polypoid mucosal thickening and air-fluid levels within all of the paranasal sinuses. CT CERVICAL SPINE FINDINGS There is no evidence for acute fracture or dislocation. Again seen are multilevel osteoarthritic  changes and facet joint arthropathy of the cervical spine with stable grade 1 anterolisthesis of C4 on C5 and mild rotatory subluxation at C1-C2. Prevertebral soft tissues have a normal appearance. Lung apices have a normal appearance. IMPRESSION: No acute intracranial abnormality. Atrophy, chronic microvascular disease. Pansinusitis. No evidence of fracture of the cervical spine. Multilevel osteoarthritic changes of the cervical spine with chronic 2 mm anterolisthesis of C4 on C5 and mild rotatory subluxation at C1-C2. Electronically Signed   By: Fidela Salisbury M.D.   On: 10/29/2015 17:40   Dg Chest Portable 1 View  10/29/2015  CLINICAL DATA:  Cough and shortness of breath. History of hip fracture. EXAM: PORTABLE CHEST 1 VIEW COMPARISON:  None. FINDINGS: The cardiac silhouette is enlarged. Mediastinal contours appear intact. There is calcified atherosclerotic disease of the aorta. There is no evidence of focal airspace consolidation, pleural effusion or pneumothorax. There is mild pulmonary vascular congestion. Osseous structures are without acute abnormality. Soft tissues are grossly normal. IMPRESSION: Enlarged cardiac silhouette. Mild pulmonary vascular congestion. No evidence of focal airspace consolidation. Electronically Signed   By: Fidela Salisbury M.D.   On: 10/29/2015 16:30   Dg Hand Complete Right  10/29/2015  CLINICAL DATA:  Right hand pain and bruising status post fall. EXAM: RIGHT HAND - COMPLETE 3+ VIEW COMPARISON:  None. FINDINGS: There is advanced osteopenia. There is no evidence of acute displaced fracture, although in the settings of advanced osteopenia subtle fractures may be easily overlooked. There is apparent deformity of the radial styloid process, which may represent sequela of prior trauma, advanced osteoarthritic changes or radio occult fracture. There is a chronic appearing deformity of the head of the fifth metacarpal, which may be due to prior injury. Advanced arthropathy  is seen involving the intercarpal joints, carpometacarpal, radiocarpal, and interphalangeal joints. There is a soft tissue swelling of the wrist. IMPRESSION: Advanced osteopenia. Apparent deformity of the radial styloid process, which may represent sequela of prior trauma, advanced osteoarthritic changes or radio occult fracture. Chronic appearing deformity of the head of the fifth proximal phalanx. Advanced arthropathy involving the intercarpal joints, carpometacarpal, radiocarpal, and interphalangeal joints. Soft tissue swelling of the wrist. Electronically Signed   By: Fidela Salisbury M.D.   On: 10/29/2015 14:50   Dg Hip Unilat With Pelvis 2-3 Views Right  10/29/2015  CLINICAL DATA:  Right-sided pain and bruising along the hip and femur over the past 2 days. Fall. EXAM: DG HIP (WITH OR WITHOUT PELVIS) 2-3V RIGHT COMPARISON:  None. FINDINGS: Subcapital right hip fracture with associated impaction. Bony demineralization suggesting osteoporosis. Deformity from prior left hip fracture with cannulated screws in place. Mild prominence of gas and stool in the distal colon. IMPRESSION: 1. Subcapital right hip fracture with associated impaction. 2. Bony demineralization  favoring osteoporosis. 3. Old left hip fracture with cannulated screws in place. 4. Prominence of gas and stool in the distal large bowel. Electronically Signed   By: Van Clines M.D.   On: 10/29/2015 14:42   Dg Femur, Min 2 Views Right  10/29/2015  CLINICAL DATA:  Fall with right hip and upper leg pain. EXAM: RIGHT FEMUR 2 VIEWS COMPARISON:  None. FINDINGS: Subcapital right femoral neck fracture with resulting deformity and likely some rotation of the femoral head with respect to the femoral neck. Bony demineralization. No other femur fracture is identified. IMPRESSION: 1. Subcapital femoral neck fracture on the right. Electronically Signed   By: Van Clines M.D.   On: 10/29/2015 14:44    Scheduled Meds: . sodium chloride    Intravenous Once  . sodium chloride   Intravenous Once  . atorvastatin  40 mg Oral q1800  . [START ON 10/31/2015] chlorhexidine   Topical Once  . ciprofloxacin  200 mg Intravenous Q24H  . heparin  5,000 Units Subcutaneous 3 times per day  . LORazepam  0.5 mg Oral QHS  . memantine  10 mg Oral BID  . pantoprazole  40 mg Oral Daily  . senna  1 tablet Oral Daily  . sodium chloride  3 mL Intravenous Q12H  . [START ON 10/31/2015] vancomycin  1,000 mg Intravenous Once    Continuous Infusions: . dextrose 5 % and 0.9% NaCl 50 mL/hr (10/29/15 2148)     Time spent: 25 minutes   [facility] Pager [phone] . If 7PM-7AM, please contact night-coverage at [email or web address], password [facility] 10/30/2015, 1:56 PM  LOS: 1 day

## 2015-10-30 NOTE — Consult Note (Signed)
HOSPITAL CONSULT [phone] (HIP FRACTURE )  MDM= MODERATE COMPLEXITY COMP HISTORY COMP EXAM  Patient ID: Kathryn Hamilton, female   DOB: 1931/02/21, 82 y.o.   MRN: [id number]  New patient/Consult   Chief Complaint  Patient presents with  . Hip Injury     Kathryn Hamilton is a 82 y.o. female.   HPI 82 yo female at nursing home fell 3 days ago. Location-right hip pain  Duration-3 days Severity-high Quality-dull ache Modified by- walking   Review of Systems (all) Review of Systems  Unable to perform ROS: patient nonverbal    Past Medical History  Diagnosis Date  . Osteoporosis   . Alzheimer disease   . HLD (hyperlipidemia)   . Colon cancer ([place]) 1996     Past Surgical History  Procedure Laterality Date  . Colon surgery    . Hip pinning,cannulated Left 07/16/2015    Procedure: CANNULATED HIP PINNING /INTERNAL FIXATION LEFT HIP;  Surgeon: Carole Civil, MD;  Location: [facility];  Service: Orthopedics;  Laterality: Left;     Family History  Problem Relation Age of Onset  . Heart disease Father      Social History Social History  Substance Use Topics  . Smoking status: Former Smoker    Quit date: 01/31/1973  . Smokeless tobacco: Never Used  . Alcohol Use: No     Allergies  Allergen Reactions  . Penicillins Swelling    Tongue swells    Current Facility-Administered Medications  Medication Dose Route Frequency Provider Last Rate Last Dose  . 0.9 %  sodium chloride infusion   Intravenous Once Carole Civil, MD      . acetaminophen (TYLENOL) tablet 650 mg  650 mg Oral Q6H PRN Orvan Falconer, MD      . atorvastatin (LIPITOR) tablet 40 mg  40 mg Oral q1800 Orvan Falconer, MD   40 mg at 10/29/15 2152  . [START ON 10/31/2015] chlorhexidine (HIBICLENS) 4 % liquid   Topical Once Carole Civil, MD      . dextrose 5 %-0.9 % sodium chloride infusion   Intravenous Continuous Orvan Falconer, MD 50 mL/hr at 10/29/15 2148 50 mL/hr at 10/29/15 2148  . heparin injection 5,000  Units  5,000 Units Subcutaneous 3 times per day Orvan Falconer, MD   5,000 Units at 10/30/15 [id number]  . LORazepam (ATIVAN) tablet 0.5 mg  0.5 mg Oral QHS Orvan Falconer, MD   0.5 mg at 10/29/15 2148  . memantine (NAMENDA) tablet 10 mg  10 mg Oral BID Orvan Falconer, MD   10 mg at 10/29/15 2148  . morphine 4 MG/ML injection 4 mg  4 mg Intravenous [id number] PRN Delora Fuel, MD      . pantoprazole (PROTONIX) EC tablet 40 mg  40 mg Oral Daily Orvan Falconer, MD   40 mg at 10/29/15 2152  . senna (SENOKOT) tablet 8.6 mg  1 tablet Oral Daily Orvan Falconer, MD      . sodium chloride 0.9 % injection 3 mL  3 mL Intravenous Q12H Orvan Falconer, MD   3 mL at 10/29/15 2149  . [START ON 10/31/2015] vancomycin (VANCOCIN) IVPB 1000 mg/200 mL premix  1,000 mg Intravenous Once Carole Civil, MD         Physical Exam(=30) Blood pressure 126/61, pulse 114, temperature 98.7 F (37.1 C), temperature source Oral, resp. rate 20, height 4\' 10"  (1.473 m), weight 86 lb 6.4 oz (39.191 kg), SpO2 97 %. Gen. Appearance small frame, no deformity Peripheral vascular  system 2 + pulses  Lymph nodes groin area normal  Gait cant walk   Upper extremities  Inspection revealed no malalignment or asymmetry  Assessment of range of motion: Full range of motion was recorded  Assessment of stability: Elbow wrist and hand and shoulder were stable  Assessment of muscle strength and tone revealed grade 5 muscle strength and normal muscle tone  Skin was normal without rash lesion or ulceration  Left Lower extremitiy  Inspection revealed no malalignment or asymmetry  Assessment of range of motion: Full range of motion was recorded  Assessment of stability: Elbow wrist and hand and shoulder were stable  Assessment of muscle strength and tone revealed grade 5 muscle strength and normal muscle tone  Skin was normal without rash lesion or ulceration  Right lower extrem  tender right greater trochanteric area with painful range of motion which is limited by the pain. The hip  is reduced. The muscle tone of the right leg is normal and the skin is intact   Coordination was not tested secondary to patient being nonverbal and what I perceive to be heavily sedated  Deep tendon reflexes were 2+ in the upper extremities and 2+ in the lower extremities Examination of sensation responded to pressure and to pain  Mental status  Heavily sedated  Dx:   Data Reviewed  I reviewed the images and the reports and my independent interpretation is subcapital right hip fracture nondisplaced stable   Assessment  Stable right hip nondisplaced subcapital fracture  Plan   Open treatment internal fixation with cannulated screws

## 2015-10-30 NOTE — Progress Notes (Signed)
ANTIBIOTIC CONSULT NOTE - INITIAL  Pharmacy Consult for cipro Indication: UTI  Allergies  Allergen Reactions  . Penicillins Swelling    Tongue swells    Patient Measurements: Height: 4\' 10"  (147.3 cm) Weight: 86 lb 6.4 oz (39.191 kg) IBW/kg (Calculated) : 40.9   Vital Signs: Temp: 98.7 F (37.1 C) (01/17 1325) Temp Source: Axillary (01/17 1325) BP: 148/58 mmHg (01/17 1325) Pulse Rate: 113 (01/17 1325) Intake/Output from previous day: 01/16 0701 - 01/17 0700 In: 460 [I.V.:460] Out: 500 [Urine:500] Intake/Output from this shift: Total I/O In: 280 [I.V.:250; Blood:30] Out: -   Labs:  Recent Labs  10/29/15 1618 10/30/15 0644  WBC 9.4 6.6  HGB 10.5* 8.7*  PLT 247 202  CREATININE 1.42* 1.00   Estimated Creatinine Clearance: 26.8 mL/min (by C-G formula based on Cr of 1). No results for input(s): VANCOTROUGH, VANCOPEAK, VANCORANDOM, GENTTROUGH, GENTPEAK, GENTRANDOM, TOBRATROUGH, TOBRAPEAK, TOBRARND, AMIKACINPEAK, AMIKACINTROU, AMIKACIN in the last 72 hours.   Microbiology: Recent Results (from the past 720 hour(s))  Urine culture     Status: None (Preliminary result)   Collection Time: 10/29/15  5:40 PM  Result Value Ref Range Status   Specimen Description URINE, CATHETERIZED  Final   Special Requests NONE  Final   Culture   Final    TOO YOUNG TO READ Performed at [facility]    Report Status PENDING  Incomplete  Surgical pcr screen     Status: None   Collection Time: 10/30/15  1:51 AM  Result Value Ref Range Status   MRSA, PCR NEGATIVE NEGATIVE Final   Staphylococcus aureus NEGATIVE NEGATIVE Final    Comment:        The Xpert SA Assay (FDA approved for NASAL specimens in patients over 35 years of age), is one component of a comprehensive surveillance program.  Test performance has been validated by [facility] for patients greater than or equal to 20 year old. It is not intended to diagnose infection nor to guide or monitor treatment.      Medical History: Past Medical History  Diagnosis Date  . Osteoporosis   . Alzheimer disease   . HLD (hyperlipidemia)   . Colon cancer ([place]) 1996    Medications:  See medication history Assessment: 82 yo lady to start cipro for UTI.  Her CrCl ~27 ml/min.  She was admitted for hip fracture  Goal of Therapy:  Eradication of infection  Plan:  Cipro 200 mg IV q24 hours F/u renal function, cultures, clinical course and change to po  Thanks for allowing pharmacy to be a part of this patient's care.  Excell Seltzer, PharmD Clinical Pharmacist 10/30/2015,1:56 PM

## 2015-10-30 NOTE — Progress Notes (Signed)
Preoperative note  Diagnosis right femoral neck fracture  Planned surgical treatment open treatment internal fixation with cannulated screws  CBC Latest Ref Rng 10/30/2015 10/29/2015 08/31/2015  WBC 4.0 - 10.5 K/uL 6.6 9.4 6.4  Hemoglobin 12.0 - 15.0 g/dL 8.7(L) 10.5(L) 10.6(L)  Hematocrit 36.0 - 46.0 % 27.2(L) 32.3(L) 32.4(L)  Platelets 150 - 400 K/uL 202 247 199    BMP Latest Ref Rng 10/30/2015 10/29/2015 08/31/2015  Glucose 65 - 99 mg/dL 120(H) 162(H) 132(H)  BUN 6 - 20 mg/dL 33(H) 52(H) 52(H)  Creatinine 0.44 - 1.00 mg/dL 1.00 1.42(H) 1.20(H)  BUN/Creat Ratio 11 - 26 - - -  Sodium 135 - 145 mmol/L 143 139 139  Potassium 3.5 - 5.1 mmol/L 3.8 4.6 4.3  Chloride 101 - 111 mmol/L 114(H) 108 107  CO2 22 - 32 mmol/L 23 23 24   Calcium 8.9 - 10.3 mg/dL 8.7(L) 9.4 9.2    BLOOD UNITS AVAILABLE 2+ to the head   Chest x-ray  no acute disease  EKG no active arrhythmias  Plain film findings subcapital fracture right hip nondisplaced  OR orders have been placed  Hemoglobin 8.7, transfuse 1 unit we do not expect a large blood loss for this case PT/INR also pending

## 2015-10-30 NOTE — Care Management Note (Signed)
Case Management Note  Patient Details  Name: Kathryn Hamilton MRN: [id number] Date of Birth: Mar 07, 1931  Subjective/Objective:                  Pt admitted for hip fx, surgery scheduled for 10/31/2015. Pt is from [facility] SNF. Anticipate return to SNF at DC. CSW is aware of DC plan and will arrange for return to facility.   Action/Plan: No CM needs.   Expected Discharge Date:     11/02/2015             Expected Discharge Plan:  Skilled Nursing Facility  In-House Referral:  Clinical Social Work  Discharge planning Services  CM Consult  Post Acute Care Choice:  NA Choice offered to:  NA  DME Arranged:    DME Agency:     HH Arranged:    [place] Agency:     Status of Service:  Completed, signed off  Medicare Important Message Given:    Date Medicare IM Given:    Medicare IM give by:    Date Additional Medicare IM Given:    Additional Medicare Important Message give by:     If discussed at [place] of Stay Meetings, dates discussed:    Additional Comments:  Sherald Barge, RN 10/30/2015, 4:16 PM

## 2015-10-31 ENCOUNTER — Inpatient Hospital Stay (HOSPITAL_COMMUNITY): Payer: Medicare Other | Admitting: Anesthesiology

## 2015-10-31 ENCOUNTER — Inpatient Hospital Stay (HOSPITAL_COMMUNITY): Payer: Medicare Other

## 2015-10-31 ENCOUNTER — Encounter (HOSPITAL_COMMUNITY): Payer: Self-pay | Admitting: Anesthesiology

## 2015-10-31 ENCOUNTER — Encounter (HOSPITAL_COMMUNITY)
Admission: EM | Disposition: A | Discharge status: Skilled Nursing Facility | Payer: Self-pay | Source: Home / Self Care | Attending: Orthopedic Surgery

## 2015-10-31 DIAGNOSIS — S72001A Fracture of unspecified part of neck of right femur, initial encounter for closed fracture: Secondary | ICD-10-CM | POA: Diagnosis present

## 2015-10-31 HISTORY — PX: HIP PINNING,CANNULATED: SHX1758

## 2015-10-31 LAB — PROTIME-INR
INR: 1.2 (ref 0.00–1.49)
PROTHROMBIN TIME: 15.3 s — AB (ref 11.6–15.2)

## 2015-10-31 LAB — CBC
HCT: 34.6 % — ABNORMAL LOW (ref 36.0–46.0)
HEMOGLOBIN: 11.4 g/dL — AB (ref 12.0–15.0)
MCH: 29.5 pg (ref 26.0–34.0)
MCHC: 32.9 g/dL (ref 30.0–36.0)
MCV: 89.6 fL (ref 78.0–100.0)
PLATELETS: 194 10*3/uL (ref 150–400)
RBC: 3.86 MIL/uL — AB (ref 3.87–5.11)
RDW: 14.3 % (ref 11.5–15.5)
WBC: 8 10*3/uL (ref 4.0–10.5)

## 2015-10-31 LAB — GLUCOSE, CAPILLARY: GLUCOSE-CAPILLARY: 146 mg/dL — AB (ref 65–99)

## 2015-10-31 LAB — URINE CULTURE

## 2015-10-31 LAB — TROPONIN I: TROPONIN I: 0.12 ng/mL — AB (ref <0.031)

## 2015-10-31 LAB — SURGICAL PCR SCREEN
MRSA, PCR: NEGATIVE
STAPHYLOCOCCUS AUREUS: NEGATIVE

## 2015-10-31 SURGERY — FIXATION, FEMUR, NECK, PERCUTANEOUS, USING SCREW
Anesthesia: Spinal | Site: Hip | Laterality: Right

## 2015-10-31 MED ORDER — ACETAMINOPHEN 10 MG/ML IV SOLN
500.0000 mg | Freq: Four times a day (QID) | INTRAVENOUS | Status: AC
  Administered 2015-10-31 (×2): 500 mg via INTRAVENOUS
  Filled 2015-10-31 (×2): qty 50

## 2015-10-31 MED ORDER — ONDANSETRON HCL 4 MG/2ML IJ SOLN: 4.0000 mg | Freq: Four times a day (QID) | INTRAMUSCULAR | Status: DC | PRN

## 2015-10-31 MED ORDER — PHENYLEPHRINE HCL 10 MG/ML IJ SOLN
INTRAMUSCULAR | Status: DC | PRN
  Administered 2015-10-31 (×4): 40 ug via INTRAVENOUS

## 2015-10-31 MED ORDER — PHENOL 1.4 % MT LIQD: 1.0000 | OROMUCOSAL | Status: DC | PRN

## 2015-10-31 MED ORDER — MENTHOL 3 MG MT LOZG: 1.0000 | LOZENGE | OROMUCOSAL | Status: DC | PRN

## 2015-10-31 MED ORDER — VANCOMYCIN HCL IN DEXTROSE 1-5 GM/200ML-% IV SOLN: 1000.0000 mg | Freq: Two times a day (BID) | INTRAVENOUS | Status: DC

## 2015-10-31 MED ORDER — CETYLPYRIDINIUM CHLORIDE 0.05 % MT LIQD
7.0000 mL | Freq: Two times a day (BID) | OROMUCOSAL | Status: DC
  Administered 2015-10-31 – 2015-11-02 (×3): 7 mL via OROMUCOSAL

## 2015-10-31 MED ORDER — CHLORHEXIDINE GLUCONATE 0.12 % MT SOLN
15.0000 mL | Freq: Two times a day (BID) | OROMUCOSAL | Status: DC
  Administered 2015-10-31 – 2015-11-02 (×3): 15 mL via OROMUCOSAL
  Filled 2015-10-31 (×3): qty 15

## 2015-10-31 MED ORDER — ACETAMINOPHEN 10 MG/ML IV SOLN
500.0000 mg | Freq: Four times a day (QID) | INTRAVENOUS | Status: DC
  Filled 2015-10-31 (×4): qty 50

## 2015-10-31 MED ORDER — FENTANYL CITRATE (PF) 100 MCG/2ML IJ SOLN
INTRAMUSCULAR | Status: AC
  Filled 2015-10-31: qty 2

## 2015-10-31 MED ORDER — ENSURE ENLIVE PO LIQD
237.0000 mL | Freq: Two times a day (BID) | ORAL | Status: DC
  Administered 2015-11-02: 237 mL via ORAL

## 2015-10-31 MED ORDER — PROPOFOL 10 MG/ML IV BOLUS
INTRAVENOUS | Status: DC | PRN
  Administered 2015-10-31: 6 mg via INTRAVENOUS

## 2015-10-31 MED ORDER — BUPIVACAINE-EPINEPHRINE (PF) 0.5% -1:200000 IJ SOLN
INTRAMUSCULAR | Status: DC | PRN
  Administered 2015-10-31: 60 mL

## 2015-10-31 MED ORDER — BUPIVACAINE IN DEXTROSE 0.75-8.25 % IT SOLN
INTRATHECAL | Status: DC | PRN
  Administered 2015-10-31: 15 mL via INTRATHECAL

## 2015-10-31 MED ORDER — FENTANYL CITRATE (PF) 100 MCG/2ML IJ SOLN: 25.0000 ug | INTRAMUSCULAR | Status: DC | PRN

## 2015-10-31 MED ORDER — MORPHINE SULFATE (PF) 2 MG/ML IV SOLN
1.0000 mg | INTRAVENOUS | Status: DC | PRN
  Administered 2015-10-31 – 2015-11-01 (×2): 1 mg via INTRAVENOUS
  Filled 2015-10-31 (×2): qty 1

## 2015-10-31 MED ORDER — SODIUM CHLORIDE 0.9 % IV SOLN
INTRAVENOUS | Status: DC
  Administered 2015-10-31 – 2015-11-01 (×3): via INTRAVENOUS

## 2015-10-31 MED ORDER — PROPOFOL 500 MG/50ML IV EMUL
INTRAVENOUS | Status: DC | PRN
  Administered 2015-10-31: 15 ug/kg/min via INTRAVENOUS

## 2015-10-31 MED ORDER — ONDANSETRON HCL 4 MG PO TABS: 4.0000 mg | ORAL_TABLET | Freq: Four times a day (QID) | ORAL | Status: DC | PRN

## 2015-10-31 MED ORDER — LACTATED RINGERS IV SOLN
INTRAVENOUS | Status: DC
  Administered 2015-10-31 (×2): via INTRAVENOUS

## 2015-10-31 MED ORDER — LIDOCAINE HCL (PF) 1 % IJ SOLN
INTRAMUSCULAR | Status: AC
  Filled 2015-10-31: qty 5

## 2015-10-31 MED ORDER — FENTANYL CITRATE (PF) 100 MCG/2ML IJ SOLN
25.0000 ug | Freq: Once | INTRAMUSCULAR | Status: AC
  Administered 2015-10-31: 25 ug via INTRAVENOUS
  Filled 2015-10-31: qty 2

## 2015-10-31 MED ORDER — ONDANSETRON HCL 4 MG/2ML IJ SOLN: 4.0000 mg | Freq: Once | INTRAMUSCULAR | Status: DC | PRN

## 2015-10-31 MED ORDER — MIDAZOLAM HCL 2 MG/2ML IJ SOLN
1.0000 mg | INTRAMUSCULAR | Status: DC | PRN
  Administered 2015-10-31: 2 mg via INTRAVENOUS
  Filled 2015-10-31: qty 2

## 2015-10-31 MED ORDER — BUPIVACAINE IN DEXTROSE 0.75-8.25 % IT SOLN
INTRATHECAL | Status: AC
  Filled 2015-10-31: qty 2

## 2015-10-31 MED ORDER — BUPIVACAINE-EPINEPHRINE (PF) 0.5% -1:200000 IJ SOLN
INTRAMUSCULAR | Status: AC
  Filled 2015-10-31: qty 60

## 2015-10-31 MED ORDER — METOCLOPRAMIDE HCL 10 MG PO TABS: 5.0000 mg | ORAL_TABLET | Freq: Three times a day (TID) | ORAL | Status: DC | PRN

## 2015-10-31 MED ORDER — VANCOMYCIN HCL IN DEXTROSE 1-5 GM/200ML-% IV SOLN
INTRAVENOUS | Status: AC
Start: 2015-10-31 — End: 2015-10-31
  Filled 2015-10-31: qty 200

## 2015-10-31 MED ORDER — METOCLOPRAMIDE HCL 5 MG/ML IJ SOLN: 5.0000 mg | Freq: Three times a day (TID) | INTRAMUSCULAR | Status: DC | PRN

## 2015-10-31 MED ORDER — TRAMADOL HCL 50 MG PO TABS
50.0000 mg | ORAL_TABLET | Freq: Four times a day (QID) | ORAL | Status: DC
  Administered 2015-11-01 – 2015-11-02 (×3): 50 mg via ORAL
  Filled 2015-10-31 (×3): qty 1

## 2015-10-31 MED ORDER — SODIUM CHLORIDE 0.9 % IR SOLN
Status: DC | PRN
  Administered 2015-10-31: 1000 mL

## 2015-10-31 MED ORDER — FENTANYL CITRATE (PF) 100 MCG/2ML IJ SOLN
INTRAMUSCULAR | Status: DC | PRN
  Administered 2015-10-31: 20 ug via INTRATHECAL
  Administered 2015-10-31: 25 ug via INTRAVENOUS

## 2015-10-31 SURGICAL SUPPLY — 54 items
BAG HAMPER (MISCELLANEOUS) ×3 IMPLANT
BIT DRILL 5 ACE CANN QC (BIT) ×3 IMPLANT
BLADE HEX COATED 2.75 (ELECTRODE) ×3 IMPLANT
BNDG GAUZE ELAST 4 BULKY (GAUZE/BANDAGES/DRESSINGS) ×3 IMPLANT
CHLORAPREP W/TINT 26ML (MISCELLANEOUS) ×3 IMPLANT
CLOTH BEACON ORANGE TIMEOUT ST (SAFETY) ×3 IMPLANT
COVER LIGHT HANDLE STERIS (MISCELLANEOUS) ×12 IMPLANT
COVER MAYO STAND XLG (DRAPE) IMPLANT
DECANTER SPIKE VIAL GLASS SM (MISCELLANEOUS) ×6 IMPLANT
DRAPE STERI IOBAN 125X83 (DRAPES) ×3 IMPLANT
DRESSING MEPILEX BORDER 6X8 (GAUZE/BANDAGES/DRESSINGS) IMPLANT
DRSG AQUACEL AG ADV 3.5X10 (GAUZE/BANDAGES/DRESSINGS) IMPLANT
DRSG MEPILEX BORDER 6X8 (GAUZE/BANDAGES/DRESSINGS) ×3
DRSG TEGADERM 4X4.75 (GAUZE/BANDAGES/DRESSINGS) IMPLANT
GLOVE BIOGEL PI IND STRL 7.0 (GLOVE) ×1 IMPLANT
GLOVE BIOGEL PI IND STRL 7.5 (GLOVE) IMPLANT
GLOVE BIOGEL PI INDICATOR 7.0 (GLOVE) ×6
GLOVE BIOGEL PI INDICATOR 7.5 (GLOVE) ×2
GLOVE ECLIPSE 6.5 STRL STRAW (GLOVE) ×4 IMPLANT
GLOVE SKINSENSE NS SZ8.0 LF (GLOVE) ×2
GLOVE SKINSENSE STRL SZ8.0 LF (GLOVE) ×1 IMPLANT
GLOVE SS N UNI LF 8.5 STRL (GLOVE) ×3 IMPLANT
GOWN STRL REUS W/TWL LRG LVL3 (GOWN DISPOSABLE) ×12 IMPLANT
GOWN STRL REUS W/TWL XL LVL3 (GOWN DISPOSABLE) ×3 IMPLANT
INST SET MAJOR BONE (KITS) ×3 IMPLANT
KIT BLADEGUARD II DBL (SET/KITS/TRAYS/PACK) ×3 IMPLANT
KIT ROOM TURNOVER APOR (KITS) ×3 IMPLANT
MANIFOLD NEPTUNE II (INSTRUMENTS) ×3 IMPLANT
MARKER SKIN DUAL TIP RULER LAB (MISCELLANEOUS) ×3 IMPLANT
NDL HYPO 21X1.5 SAFETY (NEEDLE) ×1 IMPLANT
NDL SPNL 18GX3.5 QUINCKE PK (NEEDLE) ×1 IMPLANT
NEEDLE HYPO 21X1.5 SAFETY (NEEDLE) ×3 IMPLANT
NEEDLE SPNL 18GX3.5 QUINCKE PK (NEEDLE) ×3 IMPLANT
NS IRRIG 1000ML POUR BTL (IV SOLUTION) ×3 IMPLANT
PACK BASIC III (CUSTOM PROCEDURE TRAY) ×3
PACK SRG BSC III STRL LF ECLPS (CUSTOM PROCEDURE TRAY) ×1 IMPLANT
PAD ABD 5X9 TENDERSORB (GAUZE/BANDAGES/DRESSINGS) IMPLANT
PENCIL HANDSWITCHING (ELECTRODE) ×3 IMPLANT
PIN THREADED GUIDE ACE (PIN) ×8 IMPLANT
SCREW CANN 6.5 65MM (Screw) ×4 IMPLANT
SCREW CANN 6.5 70MM (Screw) ×6 IMPLANT
SCREW CANN LG 6.5 FLT 70X22 (Screw) IMPLANT
SET BASIN LINEN APH (SET/KITS/TRAYS/PACK) ×5 IMPLANT
SPONGE LAP 18X18 X RAY DECT (DISPOSABLE) ×3 IMPLANT
STAPLER VISISTAT 35W (STAPLE) ×3 IMPLANT
SUT BRALON NAB BRD #1 30IN (SUTURE) IMPLANT
SUT MNCRL 0 VIOLET CTX 36 (SUTURE) ×1 IMPLANT
SUT MON AB 2-0 CT1 36 (SUTURE) ×3 IMPLANT
SUT MONOCRYL 0 CTX 36 (SUTURE) ×2
SYR 30ML LL (SYRINGE) ×3 IMPLANT
SYR BULB IRRIGATION 50ML (SYRINGE) ×6 IMPLANT
TOWEL OR 17X26 4PK STRL BLUE (TOWEL DISPOSABLE) ×3 IMPLANT
WASHER ACECAN 6.5 (Washer) ×2 IMPLANT
YANKAUER SUCT BULB TIP 10FT TU (MISCELLANEOUS) ×3 IMPLANT

## 2015-10-31 NOTE — Progress Notes (Signed)
SLP Cancellation Note  Patient Details Name: [name] MRN: [id number] DOB: [birth year]   Cancelled treatment:       Reason Eval/Treat Not Completed: Fatigue/lethargy limiting ability to participate; Pt just returned from surgery. She was on a D3/mech soft diet with nectar-thick liquids at [facility]. SLP will attempt tomorrow.  Thank you,  Genene Churn, [place]    [place] 10/31/2015, 3:32 PM

## 2015-10-31 NOTE — Progress Notes (Signed)
[facility] PROGRESS NOTE  Kathryn Hamilton [id number] DOB: [birth year]    PCP:   Chevis Pretty, FNP   HPI:  Kathryn Hamilton is an 82 y.o. female with hx of advanced dementia, osteoporosis, HLD, previously lives at home with caretaker and her husband, whom I admitted 3 months ago for L hip Fx, repaired by Dr Aline Brochure, went to SNF presented to the ER where she was found to have now a right closed hip Fx. Her head CT, cervical CT, hand, chest, and femur plain films were done and showed no acute Fx. Her husband said she was not complained of pain yesterday, and staff reported that she fell.  Her husband, a retired Forensic psychologist, has POA/HCP, and indicated that she is a DNR.She was found to have a UTI, Tx with Cipro, and has ORIF today.  Post op with stable hemodynamics.  She has had no CAD, HTN, DM, and had distant history of colon cancer.   Rewiew of Systems: Unable.   Past Medical History  Diagnosis Date  . Osteoporosis   . Alzheimer disease   . HLD (hyperlipidemia)   . Colon cancer ([place]) 1996    Past Surgical History  Procedure Laterality Date  . Colon surgery    . Hip pinning,cannulated Left 07/16/2015    Procedure: CANNULATED HIP PINNING /INTERNAL FIXATION LEFT HIP;  Surgeon: Carole Civil, MD;  Location: [facility];  Service: Orthopedics;  Laterality: Left;    Medications:  HOME MEDS: Prior to Admission medications   Medication Sig Start Date End Date Taking? Authorizing Provider  acetaminophen (TYLENOL) 325 MG tablet Take 650 mg by mouth every 6 (six) hours as needed for mild pain or moderate pain.    Yes Historical Provider, MD  atorvastatin (LIPITOR) 40 MG tablet Take 1 tablet (40 mg total) by mouth daily. 04/23/15  Yes Mary-Margaret Hassell Done, FNP  calcium carbonate (OSCAL) 1500 (600 Ca) MG TABS tablet Take 1,500 mg by mouth daily.   Yes Historical Provider, MD  cholecalciferol (VITAMIN D) 1000 units tablet Take 1,000 Units by mouth daily.   Yes Historical  Provider, MD  magnesium hydroxide (MILK OF MAGNESIA) 400 MG/5ML suspension Take 30 mLs by mouth daily as needed for mild constipation.   Yes Historical Provider, MD  memantine (NAMENDA) 10 MG tablet TAKE (1) TABLET TWICE A DAY. 04/23/15  Yes Mary-Margaret Hassell Done, FNP  omeprazole (PRILOSEC) 20 MG capsule Take 20 mg by mouth at bedtime.    Yes Historical Provider, MD  senna (SENOKOT) 8.6 MG TABS tablet Take 1 tablet by mouth daily.   Yes Historical Provider, MD  LORazepam (ATIVAN) 0.5 MG tablet Take 1 tablet (0.5 mg total) by mouth at bedtime. Patient not taking: Reported on 09/05/2015 07/18/15   Kathie Dike, MD  niacin (NIASPAN) 500 MG CR tablet Take 1 tablet (500 mg total) by mouth at bedtime. Patient not taking: Reported on 09/05/2015 04/23/15   Mary-Margaret Hassell Done, FNP  traMADol (ULTRAM) 50 MG tablet Take 1 tablet (50 mg total) by mouth every 6 (six) hours. Patient not taking: Reported on 09/05/2015 07/18/15   Kathie Dike, MD     Allergies:  Allergies  Allergen Reactions  . Penicillins Swelling    Tongue swells    Social History:   reports that she quit smoking about 42 years ago. She has never used smokeless tobacco. She reports that she does not drink alcohol or use illicit drugs.  Family History: Family History  Problem Relation Age of Onset  . Heart disease  Father      Physical Exam: Filed Vitals:   10/31/15 1223 10/31/15 1235 10/31/15 1309 10/31/15 1416  BP:  125/56 122/58 137/64  Pulse: 85 100 88 111  Temp:  97.6 F (36.4 C) 97.5 F (36.4 C) 97.7 F (36.5 C)  TempSrc:   Axillary Axillary  Resp: 17 20 18 20   Height:      Weight:      SpO2: 98% 100% 99% 100%   Blood pressure 137/64, pulse 111, temperature 97.7 F (36.5 C), temperature source Axillary, resp. rate 20, height 5\' 2"  (1.575 m), weight 38.556 kg (85 lb), SpO2 100 %.  GEN: sleeping.  HEENT: Mucous membranes pink and anicteric; PERRLA; EOM intact; no cervical lymphadenopathy nor thyromegaly or  carotid bruit; no JVD; There were no stridor. Neck is very supple. Breasts:: Not examined CHEST WALL: No tenderness CHEST: Normal respiration, clear to auscultation bilaterally.  HEART: Regular rate and rhythm.  There are no murmur, rub, or gallops.   BACK: No kyphosis or scoliosis; no CVA tenderness ABDOMEN: soft and non-tender; no masses, no organomegaly, normal abdominal bowel sounds; no pannus; no intertriginous candida. There is no rebound and no distention. Rectal Exam: Not done EXTREMITIES: No bone or joint deformity; age-appropriate arthropathy of the hands and knees; no edema; no ulcerations.  There is no calf tenderness. Genitalia: not examined PULSES: 2+ and symmetric SKIN: Normal hydration no rash or ulceration CNS sedated.  No able to cooperate.    Labs on Admission:  Basic Metabolic Panel:  Recent Labs Lab 10/29/15 1618 10/30/15 0644  NA 139 143  K 4.6 3.8  CL 108 114*  CO2 23 23  GLUCOSE 162* 120*  BUN 52* 33*  CREATININE 1.42* 1.00  CALCIUM 9.4 8.7*   Liver Function Tests:  Recent Labs Lab 10/30/15 0644  AST 15  ALT 13*  ALKPHOS 69  BILITOT 0.9  PROT 5.6*  ALBUMIN 2.4*   CBC:  Recent Labs Lab 10/29/15 1618 10/30/15 0644 10/31/15 0721  WBC 9.4 6.6 8.0  NEUTROABS 8.0*  --   --   HGB 10.5* 8.7* 11.4*  HCT 32.3* 27.2* 34.6*  MCV 89.0 90.1 89.6  PLT 247 202 194   Cardiac Enzymes:  Recent Labs Lab 10/30/15 0904 10/31/15 0721  TROPONINI 0.03 0.12*    CBG:  Recent Labs Lab 10/30/15 0741 10/30/15 1153 10/30/15 1636 10/30/15 2122 10/31/15 1037  GLUCAP 108* 121* 123* 122* 146*     Radiological Exams on Admission: Ct Head Wo Contrast  10/29/2015  CLINICAL DATA:  Status post multiple falls. EXAM: CT HEAD WITHOUT CONTRAST CT CERVICAL SPINE WITHOUT CONTRAST TECHNIQUE: Multidetector CT imaging of the head and cervical spine was performed following the standard protocol without intravenous contrast. Multiplanar CT image reconstructions of  the cervical spine were also generated. COMPARISON:  11/13/2013 FINDINGS: CT HEAD FINDINGS No mass effect or midline shift. No evidence of acute intracranial hemorrhage, or infarction. No abnormal extra-axial fluid collections. There is moderate brain parenchymal atrophy and chronic small vessel disease changes. Basal cisterns are preserved. No depressed skull fractures. There is polypoid mucosal thickening and air-fluid levels within all of the paranasal sinuses. CT CERVICAL SPINE FINDINGS There is no evidence for acute fracture or dislocation. Again seen are multilevel osteoarthritic changes and facet joint arthropathy of the cervical spine with stable grade 1 anterolisthesis of C4 on C5 and mild rotatory subluxation at C1-C2. Prevertebral soft tissues have a normal appearance. Lung apices have a normal appearance. IMPRESSION: No acute intracranial abnormality.  Atrophy, chronic microvascular disease. Pansinusitis. No evidence of fracture of the cervical spine. Multilevel osteoarthritic changes of the cervical spine with chronic 2 mm anterolisthesis of C4 on C5 and mild rotatory subluxation at C1-C2. Electronically Signed   By: Fidela Salisbury M.D.   On: 10/29/2015 17:40   Ct Cervical Spine Wo Contrast  10/29/2015  CLINICAL DATA:  Status post multiple falls. EXAM: CT HEAD WITHOUT CONTRAST CT CERVICAL SPINE WITHOUT CONTRAST TECHNIQUE: Multidetector CT imaging of the head and cervical spine was performed following the standard protocol without intravenous contrast. Multiplanar CT image reconstructions of the cervical spine were also generated. COMPARISON:  11/13/2013 FINDINGS: CT HEAD FINDINGS No mass effect or midline shift. No evidence of acute intracranial hemorrhage, or infarction. No abnormal extra-axial fluid collections. There is moderate brain parenchymal atrophy and chronic small vessel disease changes. Basal cisterns are preserved. No depressed skull fractures. There is polypoid mucosal thickening  and air-fluid levels within all of the paranasal sinuses. CT CERVICAL SPINE FINDINGS There is no evidence for acute fracture or dislocation. Again seen are multilevel osteoarthritic changes and facet joint arthropathy of the cervical spine with stable grade 1 anterolisthesis of C4 on C5 and mild rotatory subluxation at C1-C2. Prevertebral soft tissues have a normal appearance. Lung apices have a normal appearance. IMPRESSION: No acute intracranial abnormality. Atrophy, chronic microvascular disease. Pansinusitis. No evidence of fracture of the cervical spine. Multilevel osteoarthritic changes of the cervical spine with chronic 2 mm anterolisthesis of C4 on C5 and mild rotatory subluxation at C1-C2. Electronically Signed   By: Fidela Salisbury M.D.   On: 10/29/2015 17:40   Dg Chest Portable 1 View  10/29/2015  CLINICAL DATA:  Cough and shortness of breath. History of hip fracture. EXAM: PORTABLE CHEST 1 VIEW COMPARISON:  None. FINDINGS: The cardiac silhouette is enlarged. Mediastinal contours appear intact. There is calcified atherosclerotic disease of the aorta. There is no evidence of focal airspace consolidation, pleural effusion or pneumothorax. There is mild pulmonary vascular congestion. Osseous structures are without acute abnormality. Soft tissues are grossly normal. IMPRESSION: Enlarged cardiac silhouette. Mild pulmonary vascular congestion. No evidence of focal airspace consolidation. Electronically Signed   By: Fidela Salisbury M.D.   On: 10/29/2015 16:30   Dg Hip Operative Unilat With Pelvis Right  10/31/2015  CLINICAL DATA:  Right hip fracture repair EXAM: OPERATIVE RIGHT HIP (WITH PELVIS IF PERFORMED) 10 VIEWS TECHNIQUE: Fluoroscopic spot image(s) were submitted for interpretation post-operatively. COMPARISON:  10/29/2015 FINDINGS: Ten intraoperative spot images demonstrate placement of screws across the right femoral neck fracture. No hardware complicating feature. Anatomic alignment.  IMPRESSION: Screw placement across the right femoral neck fracture without visible complicating feature. Electronically Signed   By: Rolm Baptise M.D.   On: 10/31/2015 10:39    EKG: Independently reviewed.    Assessment/Plan Present on Admission:  . Hematuria . Dementia in Alzheimer's disease . Osteoporosis . DNR (do not resuscitate) . Closed fracture of neck of right femur ([place]) . Urinary tract infection, site not specified . Nonspecific abnormal electrocardiogram (ECG) (EKG) . Femoral neck fracture, right, closed, initial encounter  PLAN:   S/P THR right hip:  Stable post op.  ASA for DVT prophylaxis.  Dementia:  Mostly non verbal. Hematuria:  UTI.  Continue with antibiotics. DNR. Osteoporosis.   Other plans as per orders. Code Status:DNR.    Orvan Falconer, MD.  FACP [facility] Pager [phone] 7pm to 7am.  10/31/2015, 3:43 PM

## 2015-10-31 NOTE — Anesthesia Preprocedure Evaluation (Signed)
Anesthesia Evaluation  Patient identified by MRN, date of birth, ID band Patient confused    Reviewed: Allergy & Precautions, NPO status , Patient's Chart, lab work & pertinent test results  Airway Mallampati: II  TM Distance: >3 FB     Dental  (+) Teeth Intact   Pulmonary pneumonia (recent URI still with residual cough), former smoker,    breath sounds clear to auscultation       Cardiovascular  Rhythm:Regular Rate:Normal  Neg  By hx , abn EKG on adm   Neuro/Psych PSYCHIATRIC DISORDERS (Alzheimers Dementia, Ox0)    GI/Hepatic negative GI ROS,   Endo/Other    Renal/GU      Musculoskeletal   Abdominal   Peds  Hematology   Anesthesia Other Findings   Reproductive/Obstetrics                             Anesthesia Physical Anesthesia Plan  ASA: III  Anesthesia Plan: Spinal   Post-op Pain Management:    Induction:   Airway Management Planned: Simple Face Mask  Additional Equipment:   Intra-op Plan:   Post-operative Plan:   Informed Consent: I have reviewed the patients History and Physical, chart, labs and discussed the procedure including the risks, benefits and alternatives for the proposed anesthesia with the patient or authorized representative who has indicated his/her understanding and acceptance.   Consent reviewed with POA  Plan Discussed with:   Anesthesia Plan Comments:         Anesthesia Quick Evaluation

## 2015-10-31 NOTE — Op Note (Signed)
10/31/2015  10:26 AM  PATIENT:  Kathryn Hamilton  82 y.o. female  PRE-OPERATIVE DIAGNOSIS:  right femoral neck fracture  POST-OPERATIVE DIAGNOSIS:  right femoral neck fracture  PROCEDURE:  Procedure(s): INTERNAL FIXATION RIGHT HIP (Right)   3 cannulated screws asnis titanium  Findings: Stable subcapital nondisplaced hip fracture  SURGEON:  Surgeon(s) and Role:    * Carole Civil, MD - Primary  PHYSICIAN ASSISTANT:   ASSISTANTS: betty ashley    ANESTHESIA:   spinal  EBL:  Total I/O In: 700 [I.V.:700] Out: 750 [Urine:700; Blood:50]  BLOOD ADMINISTERED:none  DRAINS: none   LOCAL MEDICATIONS USED:  MARCAINE   , Amount: 60 ml and OTHER epi  SPECIMEN:  No Specimen  DISPOSITION OF SPECIMEN:  N/A  COUNTS:  YES  TOURNIQUET:  * No tourniquets in log *  DICTATION: .Dragon Dictation  PLAN OF CARE: Admit to inpatient   PATIENT DISPOSITION:  PACU - hemodynamically stable.   Delay start of Pharmacological VTE agent (>24hrs) due to surgical blood loss or risk of bleeding: yes  Details of surgical procedure  The patient was identified in the preop holding area the surgical site was confirmed and marked chart was reviewed and updated. Patient taken to surgery. She had penicillin allergy which was confirmed and we gave her vancomycin a gram. She had a spinal anesthetic. She was placed on fracture table. The left leg was placed in a well leg holder and abducted the right leg was placed in traction and gently internally rotated. C-arm was brought in and fracture was stable in valgus impacted position  Sterile prep and drape was performed timeout was completed.  An incision was made at the greater trochanter extended distally. Subcutaneous tissue divided. Hemostasis obtained with electrocautery. Fascia split in line with the skin incision. Vastus lateralis muscle also split bleeding was coagulated as was encountered. Subperiosteal dissection expose the proximal femur. The  first pin was placed inferiorly and center in the femoral head and neck area. This was confirmed by C-arm x-ray. A cluster pin guide was then used to place 2 parallel screws superiorly  This was confirmed with x-ray. Each screw was measured. The cortex was drilled. The screw was placed over the guidewire. Once all 3 screws were placed x-rays confirmed position. Final x-rays were taken AP and lateral with the pins removed leaving the screws in place. All hardware was satisfactory fracture was valgus impacted reduced.  Wound was irrigated with saline. Layered closure starting with 0 Monocryl and the vastus lateralis layer, 0 Monocryl and the fascial layer both in interrupted fashion  2-0 Monocryl in the subcutaneous layer running fashion. 30 mL of Marcaine beneath the fascia and 30 mL of Marcaine in the subcutaneous tissue  Staples used to reapproximate the skin sterile bandage applied. Patient placed on a regular table and taken recovery in stable condition  Patient is weightbearing as tolerated Staples out postop day 12 Follow-up visit postop day 28 with x-rays Aspirin for DVT prevention for 28 days No hip precautions necessary

## 2015-10-31 NOTE — Anesthesia Procedure Notes (Signed)
Spinal Patient location during procedure: OR Start time: 10/31/2015 9:06 AM Staffing Resident/CRNA: Tressie Stalker E Preanesthetic Checklist Completed: patient identified, site marked, surgical consent, pre-op evaluation, timeout performed, IV checked, risks and benefits discussed and monitors and equipment checked Spinal Block Patient position: right lateral decubitus Prep: Betadine Patient monitoring: heart rate, cardiac monitor, continuous pulse ox and blood pressure Approach: right paramedian Location: L3-4 Injection technique: single-shot Needle Needle type: Spinocan  Needle gauge: 22 G Needle length: 9 cm Assessment Sensory level: T8 Additional Notes ATTEMPTS:1 TRAY PZ:[id number] TRAY EXPIRATION DATE:02/09/2017

## 2015-10-31 NOTE — Brief Op Note (Addendum)
10/31/2015  10:26 AM  PATIENT:  Kathryn Hamilton  82 y.o. female  PRE-OPERATIVE DIAGNOSIS:  right femoral neck fracture  POST-OPERATIVE DIAGNOSIS:  right femoral neck fracture  PROCEDURE:  Procedure(s): INTERNAL FIXATION RIGHT HIP (Right)   3 cannulated screws asnis titanium  Findings: Stable subcapital nondisplaced hip fracture  SURGEON:  Surgeon(s) and Role:    * Carole Civil, MD - Primary  PHYSICIAN ASSISTANT:   ASSISTANTS: betty ashley    ANESTHESIA:   spinal  EBL:  Total I/O In: 700 [I.V.:700] Out: 750 [Urine:700; Blood:50]  BLOOD ADMINISTERED:none  DRAINS: none   LOCAL MEDICATIONS USED:  MARCAINE   , Amount: 60 ml and OTHER epi  SPECIMEN:  No Specimen  DISPOSITION OF SPECIMEN:  N/A  COUNTS:  YES  TOURNIQUET:  * No tourniquets in log *  DICTATION: .Dragon Dictation  PLAN OF CARE: Admit to inpatient   PATIENT DISPOSITION:  PACU - hemodynamically stable.   Delay start of Pharmacological VTE agent (>24hrs) due to surgical blood loss or risk of bleeding: yes  Details of surgical procedure  The patient was identified in the preop holding area the surgical site was confirmed and marked chart was reviewed and updated. Patient taken to surgery. She had penicillin allergy which was confirmed and we gave her vancomycin a gram. She had a spinal anesthetic. She was placed on fracture table. The left leg was placed in a well leg holder and abducted the right leg was placed in traction and gently internally rotated. C-arm was brought in and fracture was stable in valgus impacted position  Sterile prep and drape was performed timeout was completed.  An incision was made at the greater trochanter extended distally. Subcutaneous tissue divided. Hemostasis obtained with electrocautery. Fascia split in line with the skin incision. Vastus lateralis muscle also split bleeding was coagulated as was encountered. Subperiosteal dissection expose the proximal femur. The  first pin was placed inferiorly and center in the femoral head and neck area. This was confirmed by C-arm x-ray. A cluster pin guide was then used to place 2 parallel screws superiorly  This was confirmed with x-ray. Each screw was measured. The cortex was drilled. The screw was placed over the guidewire. Once all 3 screws were placed x-rays confirmed position. Final x-rays were taken AP and lateral with the pins removed leaving the screws in place. All hardware was satisfactory fracture was valgus impacted reduced.  Wound was irrigated with saline. Layered closure starting with 0 Monocryl and the vastus lateralis layer, 0 Monocryl and the fascial layer both in interrupted fashion  2-0 Monocryl in the subcutaneous layer running fashion. 30 mL of Marcaine beneath the fascia and 30 mL of Marcaine in the subcutaneous tissue  Staples used to reapproximate the skin sterile bandage applied. Patient placed on a regular table and taken recovery in stable condition  Patient is weightbearing as tolerated Staples out postop day 12 Follow-up visit postop day 28 with x-rays Aspirin for DVT prevention for 28 days No hip precautions necessary

## 2015-10-31 NOTE — Interval H&P Note (Signed)
History and Physical Interval Note:  10/31/2015 8:32 AM  Kathryn Hamilton  has presented today for surgery, with the diagnosis of right femoral neck fracture  The various methods of treatment have been discussed with the patient and family. After consideration of risks, benefits and other options for treatment, the patient has consented to  Procedure(s): CANNULATED HIP PINNING (Right) as a surgical intervention .  The patient's history has been reviewed, patient examined, no change in status, stable for surgery.  I have reviewed the patient's chart and labs.  Questions were answered to the patient's satisfaction.     Arther Abbott

## 2015-10-31 NOTE — Transfer of Care (Signed)
Immediate Anesthesia Transfer of Care Note  Patient: Kathryn Hamilton  Procedure(s) Performed: Procedure(s): INTERNAL FIXATION RIGHT HIP (Right)  Patient Location: PACU  Anesthesia Type:Spinal  Level of Consciousness: sedated  Airway & Oxygen Therapy: Patient Spontanous Breathing and Patient connected to face mask oxygen  Post-op Assessment: Report given to RN and Post -op Vital signs reviewed and stable  Post vital signs: Reviewed and stable  Last Vitals:  Filed Vitals:   10/31/15 0919 10/31/15 0922  BP: 125/55 116/55  Pulse:    Temp:    Resp:  0    Complications: No apparent anesthesia complications

## 2015-10-31 NOTE — H&P (View-Only) (Signed)
HOSPITAL CONSULT [phone] (HIP FRACTURE )  MDM= MODERATE COMPLEXITY COMP HISTORY COMP EXAM  Patient ID: Kathryn Hamilton, female   DOB: [birth year], 82 y.o.   MRN: [id number]  New patient/Consult   Chief Complaint  Patient presents with  . Hip Injury     Kathryn Hamilton is a 82 y.o. female.   HPI 82 yo female at nursing home fell 3 days ago. Location-right hip pain  Duration-3 days Severity-high Quality-dull ache Modified by- walking   Review of Systems (all) Review of Systems  Unable to perform ROS: patient nonverbal    Past Medical History  Diagnosis Date  . Osteoporosis   . Alzheimer disease   . HLD (hyperlipidemia)   . Colon cancer ([place]) 1996     Past Surgical History  Procedure Laterality Date  . Colon surgery    . Hip pinning,cannulated Left 07/16/2015    Procedure: CANNULATED HIP PINNING /INTERNAL FIXATION LEFT HIP;  Surgeon: Carole Civil, MD;  Location: [facility];  Service: Orthopedics;  Laterality: Left;     Family History  Problem Relation Age of Onset  . Heart disease Father      Social History Social History  Substance Use Topics  . Smoking status: Former Smoker    Quit date: 01/31/1973  . Smokeless tobacco: Never Used  . Alcohol Use: No     Allergies  Allergen Reactions  . Penicillins Swelling    Tongue swells    Current Facility-Administered Medications  Medication Dose Route Frequency Provider Last Rate Last Dose  . 0.9 %  sodium chloride infusion   Intravenous Once Carole Civil, MD      . acetaminophen (TYLENOL) tablet 650 mg  650 mg Oral Q6H PRN Orvan Falconer, MD      . atorvastatin (LIPITOR) tablet 40 mg  40 mg Oral q1800 Orvan Falconer, MD   40 mg at 10/29/15 2152  . [START ON 10/31/2015] chlorhexidine (HIBICLENS) 4 % liquid   Topical Once Carole Civil, MD      . dextrose 5 %-0.9 % sodium chloride infusion   Intravenous Continuous Orvan Falconer, MD 50 mL/hr at 10/29/15 2148 50 mL/hr at 10/29/15 2148  . heparin injection 5,000  Units  5,000 Units Subcutaneous 3 times per day Orvan Falconer, MD   5,000 Units at 10/30/15 [id number]  . LORazepam (ATIVAN) tablet 0.5 mg  0.5 mg Oral QHS Orvan Falconer, MD   0.5 mg at 10/29/15 2148  . memantine (NAMENDA) tablet 10 mg  10 mg Oral BID Orvan Falconer, MD   10 mg at 10/29/15 2148  . morphine 4 MG/ML injection 4 mg  4 mg Intravenous [id number] PRN Delora Fuel, MD      . pantoprazole (PROTONIX) EC tablet 40 mg  40 mg Oral Daily Orvan Falconer, MD   40 mg at 10/29/15 2152  . senna (SENOKOT) tablet 8.6 mg  1 tablet Oral Daily Orvan Falconer, MD      . sodium chloride 0.9 % injection 3 mL  3 mL Intravenous Q12H Orvan Falconer, MD   3 mL at 10/29/15 2149  . [START ON 10/31/2015] vancomycin (VANCOCIN) IVPB 1000 mg/200 mL premix  1,000 mg Intravenous Once Carole Civil, MD         Physical Exam(=30) Blood pressure 126/61, pulse 114, temperature 98.7 F (37.1 C), temperature source Oral, resp. rate 20, height 4\' 10"  (1.473 m), weight 86 lb 6.4 oz (39.191 kg), SpO2 97 %. Gen. Appearance small frame, no deformity Peripheral vascular  system 2 + pulses  Lymph nodes groin area normal  Gait cant walk   Upper extremities  Inspection revealed no malalignment or asymmetry  Assessment of range of motion: Full range of motion was recorded  Assessment of stability: Elbow wrist and hand and shoulder were stable  Assessment of muscle strength and tone revealed grade 5 muscle strength and normal muscle tone  Skin was normal without rash lesion or ulceration  Left Lower extremitiy  Inspection revealed no malalignment or asymmetry  Assessment of range of motion: Full range of motion was recorded  Assessment of stability: Elbow wrist and hand and shoulder were stable  Assessment of muscle strength and tone revealed grade 5 muscle strength and normal muscle tone  Skin was normal without rash lesion or ulceration  Right lower extrem  tender right greater trochanteric area with painful range of motion which is limited by the pain. The hip  is reduced. The muscle tone of the right leg is normal and the skin is intact   Coordination was not tested secondary to patient being nonverbal and what I perceive to be heavily sedated  Deep tendon reflexes were 2+ in the upper extremities and 2+ in the lower extremities Examination of sensation responded to pressure and to pain  Mental status  Heavily sedated  Dx:   Data Reviewed  I reviewed the images and the reports and my independent interpretation is subcapital right hip fracture nondisplaced stable   Assessment  Stable right hip nondisplaced subcapital fracture  Plan   Open treatment internal fixation with cannulated screws

## 2015-10-31 NOTE — Progress Notes (Signed)
0830 Fent 25mcg and 2mg  Versed given. 0840 pt becoming tachycardic 130s, tachypneic with increased WOB, O2 sats dropped into the 70s on 2L [place]. Non rebreather applied, opened airway with head tilt. O2 sats recovered to 97%, back to baseline HR 115. CRNA Santiago Glad at bedside. Will cont to monitor. Alphonsa Brickle L

## 2015-10-31 NOTE — Anesthesia Postprocedure Evaluation (Signed)
Anesthesia Post Note  Patient: Kathryn Hamilton  Procedure(s) Performed: Procedure(s) (LRB): INTERNAL FIXATION RIGHT HIP (Right)  Patient location during evaluation: PACU Anesthesia Type: Spinal Level of consciousness: awake Pain management: pain level controlled Vital Signs Assessment: post-procedure vital signs reviewed and stable Respiratory status: spontaneous breathing and patient connected to nasal cannula oxygen Cardiovascular status: blood pressure returned to baseline Postop Assessment: no signs of nausea or vomiting Anesthetic complications: no    Last Vitals:  Filed Vitals:   10/31/15 1145 10/31/15 1200  BP: 111/51 111/51  Pulse: 76 78  Temp:    Resp: 16 18    Last Pain:  Filed Vitals:   10/31/15 1211  PainSc: Asleep                 [name]

## 2015-11-01 DIAGNOSIS — S72001A Fracture of unspecified part of neck of right femur, initial encounter for closed fracture: Secondary | ICD-10-CM

## 2015-11-01 LAB — TYPE AND SCREEN
ABO/RH(D): A NEG
Antibody Screen: NEGATIVE
UNIT DIVISION: 0
Unit division: 0
Unit division: 0

## 2015-11-01 LAB — BASIC METABOLIC PANEL
Anion gap: 6 (ref 5–15)
BUN: 18 mg/dL (ref 6–20)
CALCIUM: 8.7 mg/dL — AB (ref 8.9–10.3)
CO2: 24 mmol/L (ref 22–32)
CREATININE: 0.93 mg/dL (ref 0.44–1.00)
Chloride: 112 mmol/L — ABNORMAL HIGH (ref 101–111)
GFR calc Af Amer: >60 mL/min (ref >60)
GFR, EST NON AFRICAN AMERICAN: 56 mL/min — AB (ref >60)
GLUCOSE: 101 mg/dL — AB (ref 65–99)
Potassium: 4.6 mmol/L (ref 3.5–5.1)
SODIUM: 142 mmol/L (ref 135–145)

## 2015-11-01 LAB — TROPONIN I
Troponin I: 0.05 ng/mL — ABNORMAL HIGH (ref <0.031)
Troponin I: 0.07 ng/mL — ABNORMAL HIGH (ref <0.031)

## 2015-11-01 LAB — CBC
HCT: 33.7 % — ABNORMAL LOW (ref 36.0–46.0)
Hemoglobin: 10.6 g/dL — ABNORMAL LOW (ref 12.0–15.0)
MCH: 28.5 pg (ref 26.0–34.0)
MCHC: 31.5 g/dL (ref 30.0–36.0)
MCV: 90.6 fL (ref 78.0–100.0)
PLATELETS: 191 10*3/uL (ref 150–400)
RBC: 3.72 MIL/uL — ABNORMAL LOW (ref 3.87–5.11)
RDW: 14.3 % (ref 11.5–15.5)
WBC: 8.6 10*3/uL (ref 4.0–10.5)

## 2015-11-01 MED ORDER — RESOURCE THICKENUP CLEAR PO POWD
ORAL | Status: DC | PRN
  Filled 2015-11-01: qty 125

## 2015-11-01 MED ORDER — SODIUM CHLORIDE 0.9 % IV SOLN
INTRAVENOUS | Status: DC
  Administered 2015-11-01: 17:00:00 via INTRAVENOUS

## 2015-11-01 MED ORDER — FUROSEMIDE 10 MG/ML IJ SOLN
40.0000 mg | Freq: Two times a day (BID) | INTRAMUSCULAR | Status: DC
  Administered 2015-11-01 – 2015-11-02 (×2): 40 mg via INTRAVENOUS
  Filled 2015-11-01 (×2): qty 4

## 2015-11-01 NOTE — Progress Notes (Addendum)
Dr. Marin Comment notified of patient's cough, productive at times and that patient's husband states she has had for awhile, sounds wet per ST, and speech therapy downgrading diet to puree with honey thick.  Dr. Marin Comment called and gave order to discontinue IV fluids and will start Lasix.

## 2015-11-01 NOTE — Progress Notes (Signed)
Speech Language Pathology Treatment:    Patient Details Name: CHRISTEAN REZEK MRN: [id number] DOB: 09/12/1931 Today's Date: 11/01/2015 Time: [id number] SLP Time Calculation (min) (ACUTE ONLY): 15 min  Assessment / Plan / Recommendation Clinical Impression  Pt was provided skilled ST to assess diet tolerance. Upon entering the room, SLP noted family administering the pt thin liquids via the bottom of the straw, and pt with overt wet vocal quality with throat clearing. The family was provided clear education regarding the importance of only administering foods/liquids that are recommended. The SLP cued the pt to cough which she did but was unsuccessful in clearing wet vocal quality. The SLP administered a piece of mech soft which she did not completely masticate and was manually removed from her mouth. With trials of NTL the pt demonstrated an immediate cough and mild wet vocal quality. Recommend downgrade to D1/puree' and HONEY thick liquids d/t increased wet vocal quality, continued lethargy and decreased mastication of solids administered. Pt requires 100% supervision and prefers to self feed but requires hand over hand support. A sign was placed above the pt's bed and nursing was educated of diet recommendation and family non-compliance with diet. ST to f/u and recommend ST to follow at skilled nursing facility.    HPI HPI: JIN LABARGE is an 82 y.o. female with hx of advanced dementia, osteoporosis, HLD, previously lives at home with caretaker and her husband, whom I admitted 3 months ago for L hip Fx, repaired by Dr Aline Brochure, went to SNF presented to the ER where she was found to have now a right closed hip Fx. Her head CT, cervical CT, hand, chest, and femur plain films were done and showed no acute Fx. Her husband said she was not complained of pain yesterday, and staff reported that she fell.  Her husband, a retired Forensic psychologist, has POA/HCP, and indicated that she is a DNR.She was found to  have a UTI, Tx with Cipro, and has ORIF today. Post op with stable hemodynamics. She has had no CAD, HTN, DM, and had distant history of colon cancer.      SLP Plan        Recommendations  Medication Administration: Whole meds with puree Compensations: Minimize environmental distractions;Slow rate;Small sips/bites             Oral Care Recommendations: Oral care before and after PO;Staff/trained caregiver to provide oral care Follow up Recommendations: Skilled Nursing facility     [place]. Roddie Mc, CCC-SLP Speech Language Pathologist                Wende Bushy 11/01/2015, 5:27 PM

## 2015-11-01 NOTE — Clinical Social Work Note (Signed)
CSW advised Kerri at [facility] that patient was discharging today and would return to [facility]. She advised that patient would be going to room 125.   CSW notified patient's husband, Barnabas Lister, that patient would be discharged and transported to the facility by hospital staff.  CSW sent clinicals via [employer].   CSW signing off.  Ihor Gully, [place] [phone]

## 2015-11-01 NOTE — Progress Notes (Signed)
Dr. Marin Comment notified of patient's Troponin 0.05 via text page.

## 2015-11-01 NOTE — Progress Notes (Signed)
PT Cancellation Note  Patient Details Name: Kathryn Hamilton MRN: [id number] DOB: 1931-06-17   Cancelled Treatment:    Reason Eval/Treat Not Completed: Other (comment). Chart reviewed: 2nd troponin showing elevation compared to first, with no 3rd available. Per [facility] policy, pt will need to demonstrate down-trending troponin prior to working with PT, or verbal clearance by attending/cardiology for patient safety. Will Holding evaluation at this time. Will attempt PT evaluation at later date/time as appropriate.    8:48 AM, 11/01/2015 Etta Grandchild, PT, DPT PRN Physical Therapist at [place] License # [id number] (wireless)  [phone] (mobile)

## 2015-11-01 NOTE — Progress Notes (Signed)
Dr. Marin Comment notified patient lung sounds diminished.  Coughs at times with some production.  Dr. Marin Comment gave order to give the Lasix and may leave NS at 10 ml/hr.

## 2015-11-01 NOTE — Care Management Important Message (Signed)
Important Message  Patient Details  Name: CHYRSTAL PARMELEE MRN: [id number] Date of Birth: 27-Aug-1931   Medicare Important Message Given:  Yes    Sherald Barge, RN 11/01/2015, 11:08 AM

## 2015-11-01 NOTE — Anesthesia Postprocedure Evaluation (Signed)
Anesthesia Post Note  Patient: Kathryn Hamilton  Procedure(s) Performed: Procedure(s) (LRB): INTERNAL FIXATION RIGHT HIP (Right)  Patient location during evaluation: Nursing Unit Anesthesia Type: Spinal Level of consciousness: awake Pain management: satisfactory to patient Vital Signs Assessment: post-procedure vital signs reviewed and stable Respiratory status: spontaneous breathing Cardiovascular status: stable Anesthetic complications: no    Last Vitals:  Filed Vitals:   11/01/15 0546 11/01/15 0820  BP: 119/47 120/54  Pulse: 84   Temp: 36.6 C 36.7 C  Resp: 20 20    Last Pain:  Filed Vitals:   11/01/15 0827  PainSc: 0-No pain                 Aissatou Fronczak

## 2015-11-01 NOTE — Addendum Note (Signed)
Addendum  created 11/01/15 1019 by Vista Deck, CRNA   Modules edited: Notes Section   Notes Section:  File: [id number]

## 2015-11-01 NOTE — NC FL2 (Signed)
[place] MEDICAID FL2 LEVEL OF CARE SCREENING TOOL     IDENTIFICATION  Patient Name: Kathryn Hamilton Birthdate: 07/30/1931 Sex: female Admission Date (Current Location): 10/29/2015  [facility] and [place] Number:  [employer] and Address:  [place] [address] [place]      Provider Number: [id number]  Attending Physician Name and Address:  Carole Civil, MD  Relative Name and Phone Number:       Current Level of Care: Hospital Recommended Level of Care: [place] Prior Approval Number:    Date Approved/Denied:   PASRR Number: [id number]  Discharge Plan: SNF    Current Diagnoses: Patient Active Problem List   Diagnosis Date Noted  . Femoral neck fracture, right, closed, initial encounter 10/31/2015  . Closed displaced fracture of right femoral neck ([place])   . Urinary tract infection, site not specified 10/30/2015  . Nonspecific abnormal electrocardiogram (ECG) (EKG) 10/30/2015  . Closed fracture of neck of right femur ([place])   . Hematuria 10/29/2015  . DNR (do not resuscitate) 10/29/2015  . Urethral caruncle 09/05/2015  . Left displaced femoral neck fracture ([place]) 07/16/2015  . Fall   . Closed left hip fracture ([place]) 07/15/2015  . Hyperlipidemia with target LDL less than 100 01/31/2013  . Dementia in Alzheimer's disease 01/31/2013  . Osteoporosis 01/31/2013    Orientation RESPIRATION BLADDER Height & Weight    Self  Normal Indwelling catheter 4\' 10"  (147.3 cm) 86 lbs.  BEHAVIORAL SYMPTOMS/MOOD NEUROLOGICAL BOWEL NUTRITION STATUS  Other (Comment) (n/a)  (n/a) Incontinent Diet (Diet 2 gram sodium. )  AMBULATORY STATUS COMMUNICATION OF NEEDS Skin   Total Care  (limited communication) Bruising                       Personal Care Assistance Level of Assistance  Total care Bathing Assistance: Maximum assistance Feeding assistance: Maximum assistance Dressing Assistance: Maximum  assistance Total Care Assistance: Maximum assistance   Functional Limitations Info  Sight, Hearing, Speech Sight Info: Adequate Hearing Info: Adequate Speech Info: Impaired    SPECIAL CARE FACTORS FREQUENCY   (PT did not assess patient this admission. )     PT Frequency:  (Patient not seen by PT during this admission.)              Contractures      Additional Factors Info  Psychotropic Code Status Info:  (Full Code) Allergies Info: Penicillins Psychotropic Info: Ativan         Current Medications (11/01/2015):  This is the current hospital active medication list Current Facility-Administered Medications  Medication Dose Route Frequency Provider Last Rate Last Dose  . 0.9 %  sodium chloride infusion   Intravenous Continuous Carole Civil, MD 75 mL/hr at 11/01/15 1041    . antiseptic oral rinse (CPC / CETYLPYRIDINIUM CHLORIDE 0.05%) solution 7 mL  7 mL Mouth Rinse q12n4p Orvan Falconer, MD   7 mL at 10/31/15 1600  . atorvastatin (LIPITOR) tablet 40 mg  40 mg Oral q1800 Orvan Falconer, MD   40 mg at 10/29/15 2152  . chlorhexidine (PERIDEX) 0.12 % solution 15 mL  15 mL Mouth Rinse BID Orvan Falconer, MD   15 mL at 10/31/15 2112  . ciprofloxacin (CIPRO) IVPB 200 mg  200 mg Intravenous Q24H Annita Brod, MD   200 mg at 10/31/15 1532  . dextrose 5 %-0.9 % sodium chloride infusion   Intravenous Continuous Orvan Falconer, MD 50 mL/hr at  10/29/15 2148 50 mL/hr at 10/29/15 2148  . feeding supplement (ENSURE ENLIVE) (ENSURE ENLIVE) liquid 237 mL  237 mL Oral BID BM Oswaldo Milian, RD      . heparin injection 5,000 Units  5,000 Units Subcutaneous 3 times per day Orvan Falconer, MD   5,000 Units at 11/01/15 0557  . LORazepam (ATIVAN) tablet 0.5 mg  0.5 mg Oral QHS Orvan Falconer, MD   0.5 mg at 10/29/15 2148  . memantine (NAMENDA) tablet 10 mg  10 mg Oral BID Orvan Falconer, MD   10 mg at 10/29/15 2148  . menthol-cetylpyridinium (CEPACOL) lozenge 3 mg  1 lozenge Oral PRN Carole Civil, MD       Or  . phenol  (CHLORASEPTIC) mouth spray 1 spray  1 spray Mouth/Throat PRN Carole Civil, MD      . metoCLOPramide (REGLAN) tablet 5-10 mg  5-10 mg Oral Q8H PRN Carole Civil, MD       Or  . metoCLOPramide (REGLAN) injection 5-10 mg  5-10 mg Intravenous Q8H PRN Carole Civil, MD      . ondansetron [facility]) tablet 4 mg  4 mg Oral Q6H PRN Carole Civil, MD       Or  . ondansetron [facility]) injection 4 mg  4 mg Intravenous Q6H PRN Carole Civil, MD      . pantoprazole (PROTONIX) EC tablet 40 mg  40 mg Oral Daily Orvan Falconer, MD   40 mg at 10/29/15 2152  . senna (SENOKOT) tablet 8.6 mg  1 tablet Oral Daily Orvan Falconer, MD   8.6 mg at 10/30/15 1000  . sodium chloride 0.9 % injection 3 mL  3 mL Intravenous Q12H Orvan Falconer, MD   3 mL at 10/29/15 2149  . traMADol (ULTRAM) tablet 50 mg  50 mg Oral 4 times per day Carole Civil, MD   50 mg at 10/31/15 1400     Discharge Medications: Please see discharge summary for a list of discharge medications.  Relevant Imaging Results:  Relevant Lab Results:   Additional Information SS#: [id number]  Ihor Gully, LCSW

## 2015-11-01 NOTE — NC FL2 (Deleted)
[place] MEDICAID FL2 LEVEL OF CARE SCREENING TOOL     IDENTIFICATION  Patient Name: Kathryn Hamilton Birthdate: 07/16/1931 Sex: female Admission Date (Current Location): 10/29/2015  [facility] and [place] Number:  [employer] and Address:  [place] [address], [place]      Provider Number: [id number]  Attending Physician Name and Address:  Carole Civil, MD  Relative Name and Phone Number:       Current Level of Care: Hospital Recommended Level of Care: [place] Prior Approval Number:    Date Approved/Denied:   PASRR Number: [id number]  Discharge Plan: SNF    Current Diagnoses: Patient Active Problem List   Diagnosis Date Noted  . Femoral neck fracture, right, closed, initial encounter 10/31/2015  . Closed displaced fracture of right femoral neck ([place])   . Urinary tract infection, site not specified 10/30/2015  . Nonspecific abnormal electrocardiogram (ECG) (EKG) 10/30/2015  . Closed fracture of neck of right femur ([place])   . Hematuria 10/29/2015  . DNR (do not resuscitate) 10/29/2015  . Urethral caruncle 09/05/2015  . Left displaced femoral neck fracture ([place]) 07/16/2015  . Fall   . Closed left hip fracture ([place]) 07/15/2015  . Hyperlipidemia with target LDL less than 100 01/31/2013  . Dementia in Alzheimer's disease 01/31/2013  . Osteoporosis 01/31/2013    Orientation RESPIRATION BLADDER Height & Weight    Self  Normal Indwelling catheter 4\' 10"  (147.3 cm) 86 lbs.  BEHAVIORAL SYMPTOMS/MOOD NEUROLOGICAL BOWEL NUTRITION STATUS  Other (Comment) (n/a)  (n/a) Incontinent Diet (Diet 2 gram sodium. )  AMBULATORY STATUS COMMUNICATION OF NEEDS Skin   Total Care  (limited communication) Bruising                       Personal Care Assistance Level of Assistance  Total care Bathing Assistance: Maximum assistance Feeding assistance: Maximum assistance Dressing Assistance: Maximum  assistance Total Care Assistance: Maximum assistance   Functional Limitations Info  Sight, Hearing, Speech Sight Info: Adequate Hearing Info: Adequate Speech Info: Impaired    SPECIAL CARE FACTORS FREQUENCY   (PT did not assess patient this admission. )     PT Frequency:  (Patient not seen by PT during this admission.)              Contractures      Additional Factors Info  Psychotropic Code Status Info:  (Full Code) Allergies Info: Penicillins Psychotropic Info: Ativan         Current Medications (11/01/2015):  This is the current hospital active medication list Current Facility-Administered Medications  Medication Dose Route Frequency Provider Last Rate Last Dose  . 0.9 %  sodium chloride infusion   Intravenous Continuous Carole Civil, MD 75 mL/hr at 11/01/15 1041    . antiseptic oral rinse (CPC / CETYLPYRIDINIUM CHLORIDE 0.05%) solution 7 mL  7 mL Mouth Rinse q12n4p Orvan Falconer, MD   7 mL at 10/31/15 1600  . atorvastatin (LIPITOR) tablet 40 mg  40 mg Oral q1800 Orvan Falconer, MD   40 mg at 10/29/15 2152  . chlorhexidine (PERIDEX) 0.12 % solution 15 mL  15 mL Mouth Rinse BID Orvan Falconer, MD   15 mL at 10/31/15 2112  . ciprofloxacin (CIPRO) IVPB 200 mg  200 mg Intravenous Q24H Annita Brod, MD   200 mg at 10/31/15 1532  . dextrose 5 %-0.9 % sodium chloride infusion   Intravenous Continuous Orvan Falconer, MD 50 mL/hr at  10/29/15 2148 50 mL/hr at 10/29/15 2148  . feeding supplement (ENSURE ENLIVE) (ENSURE ENLIVE) liquid 237 mL  237 mL Oral BID BM Oswaldo Milian, RD      . heparin injection 5,000 Units  5,000 Units Subcutaneous 3 times per day Orvan Falconer, MD   5,000 Units at 11/01/15 0557  . LORazepam (ATIVAN) tablet 0.5 mg  0.5 mg Oral QHS Orvan Falconer, MD   0.5 mg at 10/29/15 2148  . memantine (NAMENDA) tablet 10 mg  10 mg Oral BID Orvan Falconer, MD   10 mg at 10/29/15 2148  . menthol-cetylpyridinium (CEPACOL) lozenge 3 mg  1 lozenge Oral PRN Carole Civil, MD       Or  . phenol  (CHLORASEPTIC) mouth spray 1 spray  1 spray Mouth/Throat PRN Carole Civil, MD      . metoCLOPramide (REGLAN) tablet 5-10 mg  5-10 mg Oral Q8H PRN Carole Civil, MD       Or  . metoCLOPramide (REGLAN) injection 5-10 mg  5-10 mg Intravenous Q8H PRN Carole Civil, MD      . ondansetron [facility]) tablet 4 mg  4 mg Oral Q6H PRN Carole Civil, MD       Or  . ondansetron [facility]) injection 4 mg  4 mg Intravenous Q6H PRN Carole Civil, MD      . pantoprazole (PROTONIX) EC tablet 40 mg  40 mg Oral Daily Orvan Falconer, MD   40 mg at 10/29/15 2152  . senna (SENOKOT) tablet 8.6 mg  1 tablet Oral Daily Orvan Falconer, MD   8.6 mg at 10/30/15 1000  . sodium chloride 0.9 % injection 3 mL  3 mL Intravenous Q12H Orvan Falconer, MD   3 mL at 10/29/15 2149  . traMADol (ULTRAM) tablet 50 mg  50 mg Oral 4 times per day Carole Civil, MD   50 mg at 10/31/15 1400     Discharge Medications: Please see discharge summary for a list of discharge medications.  Relevant Imaging Results:  Relevant Lab Results:   Additional Information SS#: [id number]  Ihor Gully, LCSW

## 2015-11-01 NOTE — Progress Notes (Addendum)
Patient has congested cough.  Patient's husband about something to help with the cough.  Not alert enough yet to take po medications per ST.  Also patient previously on ac/hs CBG.  Dr. Marin Comment notified via text page.

## 2015-11-01 NOTE — Progress Notes (Addendum)
Nutrition Brief Note  Patient identified on the Malnutrition Screening Tool (MST) Report  Pt diet at SNF is Dyspahgia 3 w/ NTL. May consider downgrading until SLP re-evaluates.   Wt Readings from Last 15 Encounters:  10/31/15 85 lb (38.556 kg)  10/11/15 95 lb (43.092 kg)  09/05/15 87 lb (39.463 kg)  08/20/15 95 lb (43.092 kg)  07/16/15 95 lb (43.092 kg)  03/08/15 97 lb (43.999 kg)  11/08/14 100 lb (45.36 kg)  10/26/14 99 lb (44.906 kg)  08/16/14 100 lb (45.36 kg)  01/24/14 100 lb (45.36 kg)  12/21/13 103 lb (46.72 kg)  11/21/13 102 lb 12.8 oz (46.63 kg)  11/13/13 98 lb (44.453 kg)  08/10/13 98 lb (44.453 kg)  01/31/13 96 lb (43.545 kg)    Body mass index is 15.54 kg/(m^2). Patient meets criteria for Underweight based on current BMI.   Pt is a resident at [facility] and who is well known by this RD. Pt unable to communicate. Just prior to injury, pt had quarterly assessment. She has been eating well  (25-100%) of a mech soft, NTL diet.. She is fed by staff in an assistive environment. Recently facility nurses had noted an increased intake.   Her weight had been stable recently at [facility]. Her UBW for the last 3 months had been 86-89 lbs.   She drinks Ensure BID at [facility] at likes- will order while admitted. Will ask for these to be thickened to nectar as that is what she is on long term and SLP has yet to evaluate inpatient.    Current diet order is 2 g Sodium, though she is yet to eat.   Burtis Junes RD, LDN Nutrition Pager: [phone] 11/01/2015 8:52 AM

## 2015-11-01 NOTE — Evaluation (Signed)
Clinical/Bedside Swallow Evaluation Patient Details  Name: Kathryn Hamilton MRN: [id number] Date of Birth: [birth year]  Today's Date: 11/01/2015 Time: SLP Start Time (ACUTE ONLY): [id number] SLP Stop Time (ACUTE ONLY): 1330 SLP Time Calculation (min) (ACUTE ONLY): 45 min  Past Medical History:  Past Medical History  Diagnosis Date  . Osteoporosis   . Alzheimer disease   . HLD (hyperlipidemia)   . Colon cancer ([place]) 1996   Past Surgical History:  Past Surgical History  Procedure Laterality Date  . Colon surgery    . Hip pinning,cannulated Left 07/16/2015    Procedure: CANNULATED HIP PINNING /INTERNAL FIXATION LEFT HIP;  Surgeon: Carole Civil, MD;  Location: [facility];  Service: Orthopedics;  Laterality: Left;   HPI:  Kathryn Hamilton is an 82 y.o. female with hx of advanced dementia, osteoporosis, HLD, previously lives at home with caretaker and her husband, whom I admitted 3 months ago for L hip Fx, repaired by Dr Aline Brochure, went to SNF presented to the ER where she was found to have now a right closed hip Fx. Her head CT, cervical CT, hand, chest, and femur plain films were done and showed no acute Fx. Her husband said she was not complained of pain yesterday, and staff reported that she fell.  Her husband, a retired Forensic psychologist, has POA/HCP, and indicated that she is a DNR.She was found to have a UTI, Tx with Cipro, and has ORIF today. Post op with stable hemodynamics. She has had no CAD, HTN, DM, and had distant history of colon cancer.   Assessment / Plan / Recommendation Clinical Impression  Pt presents with moderate oral phase dysphagia with suspected mild/mod pharyngeal phase dysphagia in setting of dementia, recent repair of hip fracture (completed yesterday), dependent feeder status, and pre-existing dysphagia which significantly increases risks for aspiration.Pt was tolerating a D3/mech soft diet with NTL prior to hip fracture and thin water after oral care between meals. Pt now  very weak and with variable alertness. When alert, Mrs. Appell took several bites of canned pears, applesauce, and sips of NTL. Pt benefited from self-feeding with SLP hand over hand cues for guidance. Recommend only feeding patient when alert and upright, asked RN to hold po meds at the end of session due to pt sleeping heavily. Above to husband and caregiver and they are in agreement with plan of care. SLP explained importance of aspiration precautions given decreased mobility (bedbound status at this time). Recommend f/u with SLP services once back to [facility].     Aspiration Risk  Moderate aspiration risk    Diet Recommendation Dysphagia 2 (Fine chop);Nectar-thick liquid   Liquid Administration via: Cup;Spoon Medication Administration: Whole meds with puree Supervision: Staff to assist with self feeding;Full supervision/cueing for compensatory strategies Compensations: Minimize environmental distractions;Slow rate;Small sips/bites Postural Changes: Remain upright for at least 30 minutes after po intake;Seated upright at 90 degrees (as able)    Other  Recommendations Oral Care Recommendations: Oral care before and after PO;Staff/trained caregiver to provide oral care Other Recommendations: Order thickener from pharmacy;Have oral suction available;Clarify dietary restrictions   Follow up Recommendations  Skilled Nursing facility    Frequency and Duration min 2x/week  1 week       Prognosis Prognosis for Safe Diet Advancement: Fair Barriers to Reach Goals: Cognitive deficits      Swallow Study   General Date of Onset: 10/29/15 HPI: Kathryn Hamilton is an 83 y.o. female with hx of advanced dementia, osteoporosis, HLD, previously lives at home  with caretaker and her husband, whom I admitted 3 months ago for L hip Fx, repaired by Dr Aline Brochure, went to SNF presented to the ER where she was found to have now a right closed hip Fx. Her head CT, cervical CT, hand, chest, and femur plain films  were done and showed no acute Fx. Her husband said she was not complained of pain yesterday, and staff reported that she fell.  Her husband, a retired Forensic psychologist, has POA/HCP, and indicated that she is a DNR.She was found to have a UTI, Tx with Cipro, and has ORIF today. Post op with stable hemodynamics. She has had no CAD, HTN, DM, and had distant history of colon cancer. Type of Study: Bedside Swallow Evaluation Diet Prior to this Study: Dysphagia 3 (soft);Nectar-thick liquids (at SNF) Temperature Spikes Noted: No Respiratory Status: Room air History of Recent Intubation: No Behavior/Cognition: Cooperative;Confused;Lethargic/Drowsy;Alert;Requires cueing Oral Cavity Assessment: Dry;Dried secretions (dried secretions along tongue) Oral Care Completed by SLP: Yes Oral Cavity - Dentition: Adequate natural dentition Vision: Functional for self-feeding Self-Feeding Abilities: Needs assist Patient Positioning: Upright in bed Baseline Vocal Quality: Wet;Low vocal intensity (mildly wet, mumbling) Volitional Cough: Cognitively unable to elicit (congested when she does cough) Volitional Swallow: Unable to elicit    Oral/Motor/Sensory Function Overall Oral Motor/Sensory Function: Generalized oral weakness Facial ROM: Reduced right;Reduced left Facial Symmetry: Within Functional Limits Lingual Strength: Reduced Mandible: Within Functional Limits   Ice Chips Ice chips: Not tested   Thin Liquid Thin Liquid: Not tested    Nectar Thick Nectar Thick Liquid: Impaired Presentation: Cup;Self Fed;Spoon (hand over hand assist) Oral Phase Impairments: Reduced lingual movement/coordination Oral phase functional implications: Prolonged oral transit;Oral residue Pharyngeal Phase Impairments: Suspected delayed Swallow;Decreased hyoid-laryngeal movement;Multiple swallows;Cough - Delayed   Honey Thick Honey Thick Liquid: Not tested   Puree Puree: Impaired Presentation: Spoon Oral Phase Impairments: Reduced  lingual movement/coordination Oral Phase Functional Implications: Oral residue Pharyngeal Phase Impairments: Suspected delayed Swallow;Decreased hyoid-laryngeal movement;Multiple swallows   Solid      Solid: Impaired Presentation: Spoon (canned pears) Oral Phase Impairments: Reduced lingual movement/coordination;Poor awareness of bolus Oral Phase Functional Implications: Prolonged oral transit;Oral residue Pharyngeal Phase Impairments: Suspected delayed Swallow;Decreased hyoid-laryngeal movement       Thank you,  Genene Churn, [place]  PORTER,DABNEY 11/01/2015,1:42 PM

## 2015-11-01 NOTE — Care Management Note (Signed)
Case Management Note  Patient Details  Name: ALAYLA MACBRIDE MRN: [id number] Date of Birth: [birth year]  Expected Discharge Date:                  Expected Discharge Plan:  [place]  In-House Referral:  Clinical Social Work  Discharge planning Services  CM Consult  Post Acute Care Choice:  NA Choice offered to:  NA  DME Arranged:    DME Agency:     HH Arranged:    [place] Agency:     Status of Service:  Completed, signed off  Medicare Important Message Given:  Yes Date Medicare IM Given:    Medicare IM give by:    Date Additional Medicare IM Given:    Additional Medicare Important Message give by:     If discussed at [place] of Stay Meetings, dates discussed:    Additional Comments: Pt returning to [facility] today. CSW will arrange for return to facility. No CM needs.   Sherald Barge, RN 11/01/2015, 11:08 AM

## 2015-11-01 NOTE — Progress Notes (Signed)
[facility] PROGRESS NOTE  Kathryn Hamilton [id number] DOB: [birth year]    PCP:   Chevis Pretty, FNP   HPI: Kathryn Hamilton is an 82 y.o. female with hx of advanced dementia, osteoporosis, HLD, previously lives at home with caretaker and her husband, whom I admitted 3 months ago for L hip Fx, repaired by Dr Aline Brochure, went to SNF presented to the ER where she was found to have now a right closed hip Fx. Her head CT, cervical CT, hand, chest, and femur plain films were done and showed no acute Fx. Her husband said she was not complained of pain yesterday, and staff reported that she fell.  Her husband, a retired Forensic psychologist, has POA/HCP, and indicated that she is a DNR.She was found to have a UTI, Tx with Cipro, and has ORIF today. Post op with stable hemodynamics. She has had no CAD, HTN, DM, and had distant history of colon cancer.Her troponin peaked at 0.12, likely from demand ischemia.   Rewiew of Systems:  Constitutional: Negative for malaise, fever and chills. No significant weight loss or weight gain Eyes: Negative for eye pain, redness and discharge, diplopia, visual changes, or flashes of light. ENMT: Negative for ear pain, hoarseness, nasal congestion, sinus pressure and sore throat. No headaches; tinnitus, drooling, or problem swallowing. Cardiovascular: Negative for chest pain, palpitations, diaphoresis, dyspnea and peripheral edema. ; No orthopnea, PND Respiratory: Negative for cough, hemoptysis, wheezing and stridor. No pleuritic chestpain. Gastrointestinal: Negative for nausea, vomiting, diarrhea, constipation, abdominal pain, melena, blood in stool, hematemesis, jaundice and rectal bleeding.    Genitourinary: Negative for frequency, dysuria, incontinence,flank pain and hematuria; Musculoskeletal: Negative for back pain and neck pain. Negative for swelling and trauma.;  Skin: . Negative for pruritus, rash, abrasions, bruising and skin lesion.; ulcerations Neuro:  Negative for headache, lightheadedness and neck stiffness. Negative for weakness, altered level of consciousness , altered mental status, extremity weakness, burning feet, involuntary movement, seizure and syncope.  Psych: negative for anxiety, depression, insomnia, tearfulness, panic attacks, hallucinations, paranoia, suicidal or homicidal ideation    Past Medical History  Diagnosis Date  . Osteoporosis   . Alzheimer disease   . HLD (hyperlipidemia)   . Colon cancer ([place]) 1996    Past Surgical History  Procedure Laterality Date  . Colon surgery    . Hip pinning,cannulated Left 07/16/2015    Procedure: CANNULATED HIP PINNING /INTERNAL FIXATION LEFT HIP;  Surgeon: Carole Civil, MD;  Location: [facility];  Service: Orthopedics;  Laterality: Left;    Medications:  HOME MEDS: Prior to Admission medications   Medication Sig Start Date End Date Taking? Authorizing Provider  acetaminophen (TYLENOL) 325 MG tablet Take 650 mg by mouth every 6 (six) hours as needed for mild pain or moderate pain.    Yes Historical Provider, MD  atorvastatin (LIPITOR) 40 MG tablet Take 1 tablet (40 mg total) by mouth daily. 04/23/15  Yes Mary-Margaret Hassell Done, FNP  calcium carbonate (OSCAL) 1500 (600 Ca) MG TABS tablet Take 1,500 mg by mouth daily.   Yes Historical Provider, MD  cholecalciferol (VITAMIN D) 1000 units tablet Take 1,000 Units by mouth daily.   Yes Historical Provider, MD  magnesium hydroxide (MILK OF MAGNESIA) 400 MG/5ML suspension Take 30 mLs by mouth daily as needed for mild constipation.   Yes Historical Provider, MD  memantine (NAMENDA) 10 MG tablet TAKE (1) TABLET TWICE A DAY. 04/23/15  Yes Mary-Margaret Hassell Done, FNP  omeprazole (PRILOSEC) 20 MG capsule Take 20 mg by mouth at bedtime.  Yes Historical Provider, MD  senna (SENOKOT) 8.6 MG TABS tablet Take 1 tablet by mouth daily.   Yes Historical Provider, MD  LORazepam (ATIVAN) 0.5 MG tablet Take 1 tablet (0.5 mg total) by mouth at  bedtime. Patient not taking: Reported on 09/05/2015 07/18/15   Kathie Dike, MD  niacin (NIASPAN) 500 MG CR tablet Take 1 tablet (500 mg total) by mouth at bedtime. Patient not taking: Reported on 09/05/2015 04/23/15   Mary-Margaret Hassell Done, FNP  traMADol (ULTRAM) 50 MG tablet Take 1 tablet (50 mg total) by mouth every 6 (six) hours. Patient not taking: Reported on 09/05/2015 07/18/15   Kathie Dike, MD     Allergies:  Allergies  Allergen Reactions  . Penicillins Swelling    Tongue swells    Social History:   reports that she quit smoking about 42 years ago. She has never used smokeless tobacco. She reports that she does not drink alcohol or use illicit drugs.  Family History: Family History  Problem Relation Age of Onset  . Heart disease Father      Physical Exam: Filed Vitals:   11/01/15 0256 11/01/15 0546 11/01/15 0820 11/01/15 1044  BP: 96/40 119/47 120/54 138/62  Pulse: 87 84  98  Temp: 97.3 F (36.3 C) 97.8 F (36.6 C) 98.1 F (36.7 C) 98.3 F (36.8 C)  TempSrc: Axillary Axillary Axillary Axillary  Resp: 18 20 20 20   Height:      Weight:      SpO2: 98% 98% 98% 97%   Blood pressure 138/62, pulse 98, temperature 98.3 F (36.8 C), temperature source Axillary, resp. rate 20, height 5\' 2"  (1.575 m), weight 38.556 kg (85 lb), SpO2 97 %.  GEN:  Pleasant  patient lying in the stretcher in no acute distress; cooperative with exam. PSYCH:  alert and confused.  does not appear anxious or depressed; affect is appropriate. HEENT: Mucous membranes pink and anicteric; PERRLA; EOM intact; no cervical lymphadenopathy nor thyromegaly or carotid bruit; no JVD; There were no stridor. Neck is very supple. Breasts:: Not examined CHEST WALL: No tenderness CHEST: Normal respiration, clear to auscultation bilaterally.  HEART: Regular rate and rhythm.  There are no murmur, rub, or gallops.   BACK: No kyphosis or scoliosis; no CVA tenderness ABDOMEN: soft and non-tender; no masses, no  organomegaly, normal abdominal bowel sounds; no pannus; no intertriginous candida. There is no rebound and no distention. Rectal Exam: Not done EXTREMITIES: No bone or joint deformity; age-appropriate arthropathy of the hands and knees; no edema; no ulcerations.  There is no calf tenderness. Genitalia: not examined PULSES: 2+ and symmetric SKIN: Normal hydration no rash or ulceration CNS: Cranial nerves 2-12 grossly intact no focal lateralizing neurologic deficit.  Speech is fluent; uvula elevated with phonation, facial symmetry and tongue midline. DTR are normal bilaterally, cerebella exam is intact, barbinski is negative and strengths are equaled bilaterally.  No sensory loss.   Labs on Admission:  Basic Metabolic Panel:  Recent Labs Lab 10/29/15 1618 10/30/15 0644 11/01/15 0726  NA 139 143 142  K 4.6 3.8 4.6  CL 108 114* 112*  CO2 23 23 24   GLUCOSE 162* 120* 101*  BUN 52* 33* 18  CREATININE 1.42* 1.00 0.93  CALCIUM 9.4 8.7* 8.7*   Liver Function Tests:  Recent Labs Lab 10/30/15 0644  AST 15  ALT 13*  ALKPHOS 69  BILITOT 0.9  PROT 5.6*  ALBUMIN 2.4*   CBC:  Recent Labs Lab 10/29/15 1618 10/30/15 [id number] 10/31/15 0721 11/01/15 [id number]  WBC 9.4 6.6 8.0 8.6  NEUTROABS 8.0*  --   --   --   HGB 10.5* 8.7* 11.4* 10.6*  HCT 32.3* 27.2* 34.6* 33.7*  MCV 89.0 90.1 89.6 90.6  PLT 247 202 194 191   Cardiac Enzymes:  Recent Labs Lab 10/30/15 0904 10/31/15 0721 11/01/15 1325  TROPONINI 0.03 0.12* 0.05*    CBG:  Recent Labs Lab 10/30/15 0741 10/30/15 1153 10/30/15 1636 10/30/15 2122 10/31/15 1037  GLUCAP 108* 121* 123* 122* 146*     Radiological Exams on Admission: Dg Hip Operative Unilat With Pelvis Right  10/31/2015  CLINICAL DATA:  Right hip fracture repair EXAM: OPERATIVE RIGHT HIP (WITH PELVIS IF PERFORMED) 10 VIEWS TECHNIQUE: Fluoroscopic spot image(s) were submitted for interpretation post-operatively. COMPARISON:  10/29/2015 FINDINGS: Ten  intraoperative spot images demonstrate placement of screws across the right femoral neck fracture. No hardware complicating feature. Anatomic alignment. IMPRESSION: Screw placement across the right femoral neck fracture without visible complicating feature. Electronically Signed   By: Rolm Baptise M.D.   On: 10/31/2015 10:39   Assessment/Plan Present on Admission:  . Hematuria . Dementia in Alzheimer's disease . Osteoporosis . DNR (do not resuscitate) . Closed fracture of neck of right femur ([place]) . Urinary tract infection, site not specified . Nonspecific abnormal electrocardiogram (ECG) (EKG) . Femoral neck fracture, right, closed, initial encounter  PLAN:  S/P THR right hip: Stable post op. ASA for DVT prophylaxis. Plan d/c tomorrow.  Dementia: Mostly non verbal. Hematuria: UTI. Continue with antibiotics. Elevated troponin:  Demand ischemia.  On ASA.  DNR. Osteoporosis.   Other plans as per orders. Code Status:DNR.   Orvan Falconer, MD.  FACP [facility] Pager [phone] 7pm to 7am.  11/01/2015, 4:53 PM

## 2015-11-01 NOTE — Progress Notes (Signed)
Subjective: 1 Day Post-Op Procedure(s) (LRB): INTERNAL FIXATION RIGHT HIP (Right)   Objective: Vital signs in last 24 hours: Temp:  [97.3 F (36.3 C)-98.8 F (37.1 C)] 98.3 F (36.8 C) (01/19 1044) Pulse Rate:  [76-111] 98 (01/19 1044) Resp:  [13-20] 20 (01/19 1044) BP: (95-139)/(40-73) 138/62 mmHg (01/19 1044) SpO2:  [82 %-100 %] 97 % (01/19 1044)  Intake/Output from previous day: 01/18 0701 - 01/19 0700 In: 2305 [I.V.:2305] Out: 1850 [Urine:1800; Blood:50] Intake/Output this shift:     Recent Labs  10/29/15 1618 10/30/15 0644 10/31/15 0721 11/01/15 0726  HGB 10.5* 8.7* 11.4* 10.6*    Recent Labs  10/31/15 0721 11/01/15 0726  WBC 8.0 8.6  RBC 3.86* 3.72*  HCT 34.6* 33.7*  PLT 194 191    Recent Labs  10/30/15 0644 11/01/15 0726  NA 143 142  K 3.8 4.6  CL 114* 112*  CO2 23 24  BUN 33* 18  CREATININE 1.00 0.93  GLUCOSE 120* 101*  CALCIUM 8.7* 8.7*    Recent Labs  10/31/15 0721  INR 1.20     Assessment/Plan: 1 Day Post-Op Procedure(s) (LRB): INTERNAL FIXATION RIGHT HIP (Right)   Arther Abbott 11/01/2015, 11:21 AM

## 2015-11-02 ENCOUNTER — Non-Acute Institutional Stay (SKILLED_NURSING_FACILITY): Payer: Medicare Other | Admitting: Internal Medicine

## 2015-11-02 ENCOUNTER — Encounter (HOSPITAL_COMMUNITY): Payer: Self-pay | Admitting: Orthopedic Surgery

## 2015-11-02 ENCOUNTER — Inpatient Hospital Stay
Admission: RE | Admit: 2015-11-02 | Discharge: 2016-07-25 | Disposition: A | Discharge status: Nursing Home (Includes ICF) | Payer: Medicare Other | Source: Ambulatory Visit | Attending: Internal Medicine | Admitting: Internal Medicine

## 2015-11-02 DIAGNOSIS — S72001D Fracture of unspecified part of neck of right femur, subsequent encounter for closed fracture with routine healing: Secondary | ICD-10-CM | POA: Diagnosis not present

## 2015-11-02 DIAGNOSIS — M810 Age-related osteoporosis without current pathological fracture: Secondary | ICD-10-CM | POA: Diagnosis not present

## 2015-11-02 DIAGNOSIS — G309 Alzheimer's disease, unspecified: Secondary | ICD-10-CM

## 2015-11-02 DIAGNOSIS — E876 Hypokalemia: Secondary | ICD-10-CM | POA: Diagnosis not present

## 2015-11-02 DIAGNOSIS — F0280 Dementia in other diseases classified elsewhere without behavioral disturbance: Secondary | ICD-10-CM

## 2015-11-02 LAB — TROPONIN I: TROPONIN I: 0.06 ng/mL — AB (ref <0.031)

## 2015-11-02 LAB — BASIC METABOLIC PANEL WITH GFR
Anion gap: 8 (ref 5–15)
BUN: 21 mg/dL — ABNORMAL HIGH (ref 6–20)
CO2: 27 mmol/L (ref 22–32)
Calcium: 8.5 mg/dL — ABNORMAL LOW (ref 8.9–10.3)
Chloride: 107 mmol/L (ref 101–111)
Creatinine, Ser: 1.1 mg/dL — ABNORMAL HIGH (ref 0.44–1.00)
GFR calc Af Amer: 53 mL/min — ABNORMAL LOW
GFR calc non Af Amer: 45 mL/min — ABNORMAL LOW
Glucose, Bld: 102 mg/dL — ABNORMAL HIGH (ref 65–99)
Potassium: 2.9 mmol/L — ABNORMAL LOW (ref 3.5–5.1)
Sodium: 142 mmol/L (ref 135–145)

## 2015-11-02 LAB — CBC
HCT: 34.8 % — ABNORMAL LOW (ref 36.0–46.0)
Hemoglobin: 11.2 g/dL — ABNORMAL LOW (ref 12.0–15.0)
MCH: 28.7 pg (ref 26.0–34.0)
MCHC: 32.2 g/dL (ref 30.0–36.0)
MCV: 89.2 fL (ref 78.0–100.0)
Platelets: 197 10*3/uL (ref 150–400)
RBC: 3.9 MIL/uL (ref 3.87–5.11)
RDW: 14.6 % (ref 11.5–15.5)
WBC: 7.5 10*3/uL (ref 4.0–10.5)

## 2015-11-02 LAB — GLUCOSE, CAPILLARY: Glucose-Capillary: 124 mg/dL — ABNORMAL HIGH (ref 65–99)

## 2015-11-02 MED ORDER — MOXIFLOXACIN HCL 400 MG PO TABS: 400.0000 mg | ORAL_TABLET | Freq: Every day | ORAL | Status: DC

## 2015-11-02 NOTE — Discharge Summary (Signed)
Physician Discharge Summary  FEMI DALAL [id number] DOB: 06-12-1931 DOA: 10/29/2015  PCP: Chevis Pretty, FNP  Admit date: 10/29/2015 Discharge date: 11/02/2015  Time spent: 35 minutes  Recommendations for Outpatient Follow-up:  1. Follow up with PCP in one week. 2. Follow up with Dr Aline Brochure in 28 days with Xray of right hip.  3.    Discharge Diagnoses:  Principal Problem:   Closed fracture of neck of right femur ([place]) Active Problems:   Dementia in Alzheimer's disease   Osteoporosis   Hematuria   DNR (do not resuscitate)   Urinary tract infection, site not specified   Nonspecific abnormal electrocardiogram (ECG) (EKG)   Closed displaced fracture of right femoral neck (HCC)   Femoral neck fracture, right, closed, initial encounter   Discharge Condition: Stable.  No SOB.  No pain.   Diet recommendation:  Medication Administration: Whole meds with puree Compensations: Minimize environmental distractions;Slow rate;Small sips/bites  Filed Weights   10/29/15 1604 10/29/15 2139 10/31/15 0812  Weight: 38.556 kg (85 lb) 39.191 kg (86 lb 6.4 oz) 38.556 kg (85 lb)    History of present illness: Patient was admitted by me on Jan 16, 17 for right hip Fx after mechanical fall.  As per my prior H and P:  " ZEFFIE KAMPER is an 82 y.o. female with hx of advanced dementia, osteoporosis, HLD, lives at home with caretaker and her husband, whom I admitted 3 months ago for L hip Fx, repaired by Dr Aline Brochure, presented to the ER where she was found to have now a right closed hip Fx. Her head CT, cervical CT, hand, chest, and femur plain films were done and showed no acute Fx. Her husband said she was not complained of pain yesterday, and staff reported that she fell.  Her husband, a retired Forensic psychologist, has POA/HCP, and indicated that she is a DNR. He also expressed wishes and gave verbal permission that she undergoes ORIF. Dr Aline Brochure of orthopedic surgery was consulted by EDP  and is aware of her admission. Hospitalist was asked to admit her for same. She has had no CAD, HTN, DM, and had distant history of colon cancer.   Hospital Course: patient was admitted and Dr Aline Brochure took her the following day to the OR and performed a right THR without incidence.  She did have some coughs and SOB, which was felt to be CHF, and her IVF was discontinued, and IV Lasix was given with prompt improvement of her symptoms.  Post op, her troponins were cycled, and it was slightly elevated, peaked at 0.12, felt to be due to demand ischemia.  She was placed on ASA post op for DVT prophylaxis, and it will also be helpful cardiac wise also.  Dr Aline Brochure instructed no restriction, and to continue on her ASA for at least 28 days, and follow up with him in a month with X ray of her right hip.  She remained a DNR, and her husband has been updated of all her progress.  For her UTI, she was given IV Cipro, and will be discharged on Avelox 400mg  per day for 5 days.   Thank you and Good Day.    Consultations:  Actuary.  Discharge Exam: Filed Vitals:   11/02/15 0657 11/02/15 0932  BP: 123/49   Pulse: 89 115  Temp: 98.5 F (36.9 C)   Resp: 20     Discharge Instructions   Discharge Instructions    Increase activity slowly    Complete by:  As directed           Current Discharge Medication List    START taking these medications   Details  moxifloxacin (AVELOX) 400 MG tablet Take 1 tablet (400 mg total) by mouth daily at 8 pm. Qty: 5 tablet, Refills: 0      CONTINUE these medications which have NOT CHANGED   Details  acetaminophen (TYLENOL) 325 MG tablet Take 650 mg by mouth every 6 (six) hours as needed for mild pain or moderate pain.     atorvastatin (LIPITOR) 40 MG tablet Take 1 tablet (40 mg total) by mouth daily. Qty: 30 tablet, Refills: 4    calcium carbonate (OSCAL) 1500 (600 Ca) MG TABS tablet Take 1,500 mg by mouth daily.    cholecalciferol (VITAMIN  D) 1000 units tablet Take 1,000 Units by mouth daily.    magnesium hydroxide (MILK OF MAGNESIA) 400 MG/5ML suspension Take 30 mLs by mouth daily as needed for mild constipation.    memantine (NAMENDA) 10 MG tablet TAKE (1) TABLET TWICE A DAY. Qty: 60 tablet, Refills: 4    omeprazole (PRILOSEC) 20 MG capsule Take 20 mg by mouth at bedtime.     senna (SENOKOT) 8.6 MG TABS tablet Take 1 tablet by mouth daily.      STOP taking these medications     LORazepam (ATIVAN) 0.5 MG tablet      niacin (NIASPAN) 500 MG CR tablet      traMADol (ULTRAM) 50 MG tablet        Allergies  Allergen Reactions  . Penicillins Swelling    Tongue swells      The results of significant diagnostics from this hospitalization (including imaging, microbiology, ancillary and laboratory) are listed below for reference.    Significant Diagnostic Studies: Dg Tibia/fibula Right  10/29/2015  CLINICAL DATA:  Right hip pain and bruising.  Fall. EXAM: RIGHT TIBIA AND FIBULA - 2 VIEW COMPARISON:  None. FINDINGS: Spurring along the tibial spine. No tubular fibular fracture identified. No discrete knee effusion. IMPRESSION: 1. No fracture of the tibia or fibula identified. Electronically Signed   By: Van Clines M.D.   On: 10/29/2015 14:43   Ct Head Wo Contrast  10/29/2015  CLINICAL DATA:  Status post multiple falls. EXAM: CT HEAD WITHOUT CONTRAST CT CERVICAL SPINE WITHOUT CONTRAST TECHNIQUE: Multidetector CT imaging of the head and cervical spine was performed following the standard protocol without intravenous contrast. Multiplanar CT image reconstructions of the cervical spine were also generated. COMPARISON:  11/13/2013 FINDINGS: CT HEAD FINDINGS No mass effect or midline shift. No evidence of acute intracranial hemorrhage, or infarction. No abnormal extra-axial fluid collections. There is moderate brain parenchymal atrophy and chronic small vessel disease changes. Basal cisterns are preserved. No depressed  skull fractures. There is polypoid mucosal thickening and air-fluid levels within all of the paranasal sinuses. CT CERVICAL SPINE FINDINGS There is no evidence for acute fracture or dislocation. Again seen are multilevel osteoarthritic changes and facet joint arthropathy of the cervical spine with stable grade 1 anterolisthesis of C4 on C5 and mild rotatory subluxation at C1-C2. Prevertebral soft tissues have a normal appearance. Lung apices have a normal appearance. IMPRESSION: No acute intracranial abnormality. Atrophy, chronic microvascular disease. Pansinusitis. No evidence of fracture of the cervical spine. Multilevel osteoarthritic changes of the cervical spine with chronic 2 mm anterolisthesis of C4 on C5 and mild rotatory subluxation at C1-C2. Electronically Signed   By: Fidela Salisbury M.D.   On: 10/29/2015 17:40   Ct  Cervical Spine Wo Contrast  10/29/2015  CLINICAL DATA:  Status post multiple falls. EXAM: CT HEAD WITHOUT CONTRAST CT CERVICAL SPINE WITHOUT CONTRAST TECHNIQUE: Multidetector CT imaging of the head and cervical spine was performed following the standard protocol without intravenous contrast. Multiplanar CT image reconstructions of the cervical spine were also generated. COMPARISON:  11/13/2013 FINDINGS: CT HEAD FINDINGS No mass effect or midline shift. No evidence of acute intracranial hemorrhage, or infarction. No abnormal extra-axial fluid collections. There is moderate brain parenchymal atrophy and chronic small vessel disease changes. Basal cisterns are preserved. No depressed skull fractures. There is polypoid mucosal thickening and air-fluid levels within all of the paranasal sinuses. CT CERVICAL SPINE FINDINGS There is no evidence for acute fracture or dislocation. Again seen are multilevel osteoarthritic changes and facet joint arthropathy of the cervical spine with stable grade 1 anterolisthesis of C4 on C5 and mild rotatory subluxation at C1-C2. Prevertebral soft tissues have  a normal appearance. Lung apices have a normal appearance. IMPRESSION: No acute intracranial abnormality. Atrophy, chronic microvascular disease. Pansinusitis. No evidence of fracture of the cervical spine. Multilevel osteoarthritic changes of the cervical spine with chronic 2 mm anterolisthesis of C4 on C5 and mild rotatory subluxation at C1-C2. Electronically Signed   By: Fidela Salisbury M.D.   On: 10/29/2015 17:40   Dg Chest Portable 1 View  10/29/2015  CLINICAL DATA:  Cough and shortness of breath. History of hip fracture. EXAM: PORTABLE CHEST 1 VIEW COMPARISON:  None. FINDINGS: The cardiac silhouette is enlarged. Mediastinal contours appear intact. There is calcified atherosclerotic disease of the aorta. There is no evidence of focal airspace consolidation, pleural effusion or pneumothorax. There is mild pulmonary vascular congestion. Osseous structures are without acute abnormality. Soft tissues are grossly normal. IMPRESSION: Enlarged cardiac silhouette. Mild pulmonary vascular congestion. No evidence of focal airspace consolidation. Electronically Signed   By: Fidela Salisbury M.D.   On: 10/29/2015 16:30   Dg Hand Complete Right  10/29/2015  CLINICAL DATA:  Right hand pain and bruising status post fall. EXAM: RIGHT HAND - COMPLETE 3+ VIEW COMPARISON:  None. FINDINGS: There is advanced osteopenia. There is no evidence of acute displaced fracture, although in the settings of advanced osteopenia subtle fractures may be easily overlooked. There is apparent deformity of the radial styloid process, which may represent sequela of prior trauma, advanced osteoarthritic changes or radio occult fracture. There is a chronic appearing deformity of the head of the fifth metacarpal, which may be due to prior injury. Advanced arthropathy is seen involving the intercarpal joints, carpometacarpal, radiocarpal, and interphalangeal joints. There is a soft tissue swelling of the wrist. IMPRESSION: Advanced  osteopenia. Apparent deformity of the radial styloid process, which may represent sequela of prior trauma, advanced osteoarthritic changes or radio occult fracture. Chronic appearing deformity of the head of the fifth proximal phalanx. Advanced arthropathy involving the intercarpal joints, carpometacarpal, radiocarpal, and interphalangeal joints. Soft tissue swelling of the wrist. Electronically Signed   By: Fidela Salisbury M.D.   On: 10/29/2015 14:50   Dg Hip Operative Unilat With Pelvis Right  10/31/2015  CLINICAL DATA:  Right hip fracture repair EXAM: OPERATIVE RIGHT HIP (WITH PELVIS IF PERFORMED) 10 VIEWS TECHNIQUE: Fluoroscopic spot image(s) were submitted for interpretation post-operatively. COMPARISON:  10/29/2015 FINDINGS: Ten intraoperative spot images demonstrate placement of screws across the right femoral neck fracture. No hardware complicating feature. Anatomic alignment. IMPRESSION: Screw placement across the right femoral neck fracture without visible complicating feature. Electronically Signed   By: Rolm Baptise  M.D.   On: 10/31/2015 10:39   Dg Hip Unilat With Pelvis 2-3 Views Left  10/11/2015  Radiology report The left hip is x-ray today for follow-up visit there are 3 cannulated screws for left femoral neck fracture This fractures approximately 27 weeks old It shows good impaction and position. The hardware is in good position. One of the screws has a washer with it Healing of a left femoral neck fracture with internal fixation complications  Dg Hip Unilat With Pelvis 2-3 Views Right  10/29/2015  CLINICAL DATA:  Right-sided pain and bruising along the hip and femur over the past 2 days. Fall. EXAM: DG HIP (WITH OR WITHOUT PELVIS) 2-3V RIGHT COMPARISON:  None. FINDINGS: Subcapital right hip fracture with associated impaction. Bony demineralization suggesting osteoporosis. Deformity from prior left hip fracture with cannulated screws in place. Mild prominence of gas and stool in the  distal colon. IMPRESSION: 1. Subcapital right hip fracture with associated impaction. 2. Bony demineralization favoring osteoporosis. 3. Old left hip fracture with cannulated screws in place. 4. Prominence of gas and stool in the distal large bowel. Electronically Signed   By: Van Clines M.D.   On: 10/29/2015 14:42   Dg Femur, Min 2 Views Right  10/29/2015  CLINICAL DATA:  Fall with right hip and upper leg pain. EXAM: RIGHT FEMUR 2 VIEWS COMPARISON:  None. FINDINGS: Subcapital right femoral neck fracture with resulting deformity and likely some rotation of the femoral head with respect to the femoral neck. Bony demineralization. No other femur fracture is identified. IMPRESSION: 1. Subcapital femoral neck fracture on the right. Electronically Signed   By: Van Clines M.D.   On: 10/29/2015 14:44    Microbiology: Recent Results (from the past 240 hour(s))  Urine culture     Status: None   Collection Time: 10/29/15  5:40 PM  Result Value Ref Range Status   Specimen Description URINE, CATHETERIZED  Final   Special Requests NONE  Final   Culture   Final    MULTIPLE SPECIES PRESENT, SUGGEST RECOLLECTION Performed at [facility]    Report Status 10/31/2015 FINAL  Final  Surgical pcr screen     Status: None   Collection Time: 10/30/15  1:51 AM  Result Value Ref Range Status   MRSA, PCR NEGATIVE NEGATIVE Final   Staphylococcus aureus NEGATIVE NEGATIVE Final    Comment:        The Xpert SA Assay (FDA approved for NASAL specimens in patients over 54 years of age), is one component of a comprehensive surveillance program.  Test performance has been validated by [facility] for patients greater than or equal to 59 year old. It is not intended to diagnose infection nor to guide or monitor treatment.   Surgical pcr screen     Status: None   Collection Time: 10/31/15  2:00 AM  Result Value Ref Range Status   MRSA, PCR NEGATIVE NEGATIVE Final   Staphylococcus aureus  NEGATIVE NEGATIVE Final    Comment:        The Xpert SA Assay (FDA approved for NASAL specimens in patients over 50 years of age), is one component of a comprehensive surveillance program.  Test performance has been validated by [facility] for patients greater than or equal to 6 year old. It is not intended to diagnose infection nor to guide or monitor treatment.      Labs: Basic Metabolic Panel:  Recent Labs Lab 10/29/15 1618 10/30/15 0644 11/01/15 0726 11/02/15 0620  NA 139  143 142 142  K 4.6 3.8 4.6 2.9*  CL 108 114* 112* 107  CO2 23 23 24 27   GLUCOSE 162* 120* 101* 102*  BUN 52* 33* 18 21*  CREATININE 1.42* 1.00 0.93 1.10*  CALCIUM 9.4 8.7* 8.7* 8.5*   Liver Function Tests:  Recent Labs Lab 10/30/15 0644  AST 15  ALT 13*  ALKPHOS 69  BILITOT 0.9  PROT 5.6*  ALBUMIN 2.4*   CBC:  Recent Labs Lab 10/29/15 1618 10/30/15 0644 10/31/15 0721 11/01/15 0726 11/02/15 0620  WBC 9.4 6.6 8.0 8.6 7.5  NEUTROABS 8.0*  --   --   --   --   HGB 10.5* 8.7* 11.4* 10.6* 11.2*  HCT 32.3* 27.2* 34.6* 33.7* 34.8*  MCV 89.0 90.1 89.6 90.6 89.2  PLT 247 202 194 191 197   Cardiac Enzymes:  Recent Labs Lab 10/30/15 0904 10/31/15 0721 11/01/15 1325 11/01/15 1848 11/02/15 0004  TROPONINI 0.03 0.12* 0.05* 0.07* 0.06*    CBG:  Recent Labs Lab 10/30/15 0741 10/30/15 1153 10/30/15 1636 10/30/15 2122 10/31/15 1037  GLUCAP 108* 121* 123* 122* 146*    Signed:  Kyanna Mahrt MD.  [facility] 11/02/2015, 3:11 PM

## 2015-11-02 NOTE — Progress Notes (Signed)
OT Cancellation Note  Patient Details Name: DELANE WALPOLE MRN: [id number] DOB: [birth year]   Cancelled Treatment:    Reason Eval/Treat Not Completed: Other (comment). Patient is a resident at [facility] and the plan is for her to return to SNF at discharge. Will defer OT evaluation to therapy staff at [facility]. Plan for discharge is today per chart.   Ailene Ravel, OTR/L,CBIS  [phone]  11/02/2015, 8:34 AM

## 2015-11-02 NOTE — NC FL2 (Signed)
[place] MEDICAID FL2 LEVEL OF CARE SCREENING TOOL     IDENTIFICATION  Patient Name: Kathryn Hamilton Birthdate: [birth year] Sex: female Admission Date (Current Location): 10/29/2015  [facility] and [place] Number:  [employer] and Address:  [place] [address] [place]      Provider Number: [id number]  Attending Physician Name and Address:  Carole Civil, MD  Relative Name and Phone Number:       Current Level of Care: Hospital Recommended Level of Care: [place] Prior Approval Number:    Date Approved/Denied:   PASRR Number: [id number]  Discharge Plan: SNF    Current Diagnoses: Patient Active Problem List   Diagnosis Date Noted  . Femoral neck fracture, right, closed, initial encounter 10/31/2015  . Closed displaced fracture of right femoral neck ([place])   . Urinary tract infection, site not specified 10/30/2015  . Nonspecific abnormal electrocardiogram (ECG) (EKG) 10/30/2015  . Closed fracture of neck of right femur ([place])   . Hematuria 10/29/2015  . DNR (do not resuscitate) 10/29/2015  . Urethral caruncle 09/05/2015  . Left displaced femoral neck fracture ([place]) 07/16/2015  . Fall   . Closed left hip fracture ([place]) 07/15/2015  . Hyperlipidemia with target LDL less than 100 01/31/2013  . Dementia in Alzheimer's disease 01/31/2013  . Osteoporosis 01/31/2013    [place] RESPIRATION BLADDER Height & Weight    Self  Normal Indwelling catheter 4\' 10"  (147.3 cm) 86 lbs.  BEHAVIORAL SYMPTOMS/MOOD NEUROLOGICAL BOWEL NUTRITION STATUS  Other (Comment) (n/a)  (n/a) Incontinent Diet (DIET DYS 1. Honey thick: 100% feeder assist (pt likes to self feed but needs hand over  hand assist for feeding).  PO only when alert and upright.  PO meds whole or crushed in puree.  Cont. wiht oral care. 2 g sodium.)  AMBULATORY STATUS COMMUNICATION OF NEEDS Skin   Total Care  (limited communication) Bruising                      Personal Care Assistance Level of Assistance  Total care Bathing Assistance: Maximum assistance Feeding assistance: Maximum assistance Dressing Assistance: Maximum assistance Total Care Assistance: Maximum assistance   Functional Limitations Info  Sight, Hearing, Speech Sight Info: Adequate Hearing Info: Adequate Speech Info: Impaired    SPECIAL CARE FACTORS FREQUENCY  Speech therapy     PT Frequency:  (Pt will benefit from passive range of motion as tolerated )       Speech Therapy Frequency: 3x/week  (3x/week)      Contractures      Additional Factors Info  Psychotropic Code Status Info:  (Full Code) Allergies Info: Penicillins Psychotropic Info: Ativan         Current Medications (11/02/2015):  This is the current hospital active medication list Current Facility-Administered Medications  Medication Dose Route Frequency Provider Last Rate Last Dose  . 0.9 %  sodium chloride infusion   Intravenous Continuous Orvan Falconer, MD 10 mL/hr at 11/01/15 1729    . antiseptic oral rinse (CPC / CETYLPYRIDINIUM CHLORIDE 0.05%) solution 7 mL  7 mL Mouth Rinse q12n4p Orvan Falconer, MD   7 mL at 11/02/15 1200  . atorvastatin (LIPITOR) tablet 40 mg  40 mg Oral q1800 Orvan Falconer, MD   40 mg at 11/01/15 2220  . chlorhexidine (PERIDEX) 0.12 % solution 15 mL  15 mL Mouth Rinse BID Orvan Falconer, MD   15 mL at 11/02/15 1029  . ciprofloxacin (CIPRO) IVPB  200 mg  200 mg Intravenous Q24H Annita Brod, MD   200 mg at 11/01/15 1450  . feeding supplement (ENSURE ENLIVE) (ENSURE ENLIVE) liquid 237 mL  237 mL Oral BID BM Oswaldo Milian, RD   237 mL at 11/02/15 1032  . furosemide (LASIX) injection 40 mg  40 mg Intravenous BID Orvan Falconer, MD   40 mg at 11/02/15 [id number]  . heparin injection 5,000 Units  5,000 Units Subcutaneous 3 times per day Orvan Falconer, MD   5,000 Units at 11/02/15 0604  . LORazepam (ATIVAN) tablet 0.5 mg  0.5 mg Oral QHS Orvan Falconer, MD   0.5 mg at 11/01/15 2220  . memantine (NAMENDA)  tablet 10 mg  10 mg Oral BID Orvan Falconer, MD   10 mg at 11/02/15 1028  . menthol-cetylpyridinium (CEPACOL) lozenge 3 mg  1 lozenge Oral PRN Carole Civil, MD       Or  . phenol (CHLORASEPTIC) mouth spray 1 spray  1 spray Mouth/Throat PRN Carole Civil, MD      . metoCLOPramide (REGLAN) tablet 5-10 mg  5-10 mg Oral Q8H PRN Carole Civil, MD       Or  . metoCLOPramide (REGLAN) injection 5-10 mg  5-10 mg Intravenous Q8H PRN Carole Civil, MD      . ondansetron [facility] tablet 4 mg  4 mg Oral Q6H PRN Carole Civil, MD       Or  . ondansetron [facility]) injection 4 mg  4 mg Intravenous Q6H PRN Carole Civil, MD      . pantoprazole (PROTONIX) EC tablet 40 mg  40 mg Oral Daily Orvan Falconer, MD   40 mg at 11/02/15 1028  . RESOURCE THICKENUP CLEAR   Oral PRN Orvan Falconer, MD      . [facility]) tablet 8.6 mg  1 tablet Oral Daily Orvan Falconer, MD   8.6 mg at 11/02/15 1028  . sodium chloride 0.9 % injection 3 mL  3 mL Intravenous Q12H Orvan Falconer, MD   3 mL at 10/29/15 2149  . traMADol (ULTRAM) tablet 50 mg  50 mg Oral 4 times per day Carole Civil, MD   50 mg at 11/02/15 0016     Discharge Medications: Please see discharge summary for a list of discharge medications.  Relevant Imaging Results:  Relevant Lab Results:   Additional Information SS#: [id number]  Ihor Gully, LCSW

## 2015-11-02 NOTE — Progress Notes (Signed)
Discharge to [facility], condition stable, report given to [facility]. Gilford Rile, LPN

## 2015-11-02 NOTE — Progress Notes (Signed)
Ortho instructions Patient is weightbearing as tolerated Staples out postop day 12 Follow-up visit postop day 28 with x-rays Aspirin for DVT prevention for 28 days No hip precautions necessary

## 2015-11-02 NOTE — Progress Notes (Signed)
Patient ID: Kathryn Hamilton, female   DOB: [birth year], 82 y.o.   MRN: [id number]   This is an acute visit.  Level care skilled.  Facility [facility].  Chief complaint acute visit status post hospitalization for fracture the right femur status post total hip replacement.  History of present illness.  Patient is a 82 year old female who apparently fell at the nursing facility but initially did not complain of pain-however the next day she did complain of pain and x-ray showed a right hip fracture.  Subsequently she underwent an ORIF without complications.  She did have postop some coughs and shortness of breath saw to be CHF-her IV fluids were discontinued and she was given IV Lasix with improvement in symptoms.  Her troponins were mildly elevated this was thought secondary to demand ischemia.  She was also placed on aspirin for DVT prophylaxis.  She was found to have a UTI and given IV Cipro she continues on Avelox for 5 additional days.  Patient has severe dementia and cannot really give any review of systems her vital signs appear to be stable   does have a previous history of a left hip internal fixation done the past October-.  Previous medical history.  Closed fracture neck of right femur status post ORIF.  All timers disease with significant dementia.  Osteoporosis.  History UTIs.  Hyperlipidemia.  Colon cancer.  Past surgical history.  History colon surgery.  Also history of left hip pinning internal fixation of the left hip this was back in October  Social history.  Patient quit smoking about 42 years ago no history of smokeless tobacco use alcohol or illicit drug use-she had lived at home previously with her husband before breaking her left hip back in [place] been at [facility] since then after her hospitalization.  Family history significant for heart disease in her father.  Medications.  Avelox 400 mg daily 5 days.  Tylenol 650 g every 6  hours when necessary pain.  Lipitor 40 mg daily.  Os-Cal 1500 mg daily.  Vitamin D 1000 units daily.  Magnesium oxide 30 mL daily when necessary constipation.  Namenda 10 mg twice a day.  Prilosec 20 mg daily at bedtime.  Senokot 1 tab daily.  Review of systems please see history of present illness essentially unattainable secondary to severe dementia.  Physical exam.  Patient is afebrile pulse 96 respirations 17 blood pressure 103/64.  In general this is a frail elderly female in no distress resting in bed she is sleeping but easily arousable.  Her skin is warm and dry surgical site right hip is currently covered with dressing.  Eyes-pupils appear reactive to light sclera and conjunctiva are clear visual acuity appears grossly intact.  Her oropharynx clear because memories moist she has numerous extractions.  Chest is clear to auscultation there is poor respiratory effort she does not really follow verbal commands.  Heart is regular rate and rhythm she does not have significant lower extremity edema pedal pulses appear to be intact.  Abdomen is soft nontender positive bowel sounds.  Muscle skeletal again she does have covering over her right hip surgical site-he has general frailty is able to move her other extremities I do not note any deformities other than arthritic changes.  Neurologic she does have severe dementia I do not see any lateralizing findings she is not really speaking.  Psych again she does have severe dementia.  Labs.  11/02/2015.  WBC 7.5 hemoglobin 11.2 platelets 197.  Sodium 142  potassium 2.9 BUN 21 creatinine 1.1.  Assessment and plan.  History right hip fracture surgically repaired--apparently she tolerated the procedure well-at this point receiving Tylenol for pain management at this point does not appearTo be in pain but this will have t to be monitored-Dr. Aline Brochure has recommended aspirin for 28 days for DVT prophylaxis  -history of  hypokalemia suspect this secondary to IV fluids in the hospital-will treat this with for me milliequivalents of potassium today and twice a day tomorrow update a BMP. Tomorrow may have to make dose adjustments based on what the lab tells [place] tomorrow.  dementia this appears to be severe she is on Namenda.  Suspected osteoporosis she is on calcium supplementation.  History of hyperlipidemia she continues on Lipitor 40 mg daily at some point may have to reevaluate this about benefit with patient's advanced dementia and comorbidities.  History of CHF-apparently this was thought secondary to the IV fluid she received postop-at this point she appears stable but this will have to be monitored   [id number]-of note greater than 35 minutes spent assessing patient-reviewing her chart-and coordinating formulating plan of care for numerous diagnoses-of note greater than 50% of time spent coordinating plan of care  .

## 2015-11-02 NOTE — Evaluation (Signed)
Physical Therapy Evaluation Patient Details Name: Kathryn Hamilton MRN: [id number] DOB: [birth year] Today's Date: 11/02/2015   History of Present Illness  Pt is n 82yo white female who sustained a R hip fracture, presenting today on POD2. PMH: late stage dementia, UTI, osteoporosis, hematuria. Pt sustained a L femoral neck fracture impacted without displacement during a fall at home on 10/2. Per caregiver, pt was performing limited community distances with supervision, including stairs, c eportedly only 1 other fall in the  preceding 3 years, however is now a long-term resident at [facility] since last admission. Today, pt is at baseline, as per last admission with moderate to severe language deficits, confusion, and inability to follow simple commands.  At last visit pt was inappropriate for skilled PT intervention, as she was unable to participate, follow simpe commands, or comprehend language.   Clinical Impression  This patient is familiar to me from prior admission. At baseline, pt has severe language deficits and unable to follow simple one-step commands. This is consistent both with NA for ADL this morning and also with me during evaluation. Pt is also not responsive to gestural cues. Simple hip joint PROM performed, but not tolerated well base on HR increase and muscle guarding, although patient affect is largely unchanged. Due to advanced dementia, pt is not appropriate for skilled PT services at this time, however will continue to benefit from PROM by caregivers for pain management and prevention of contractures and DVT. PT signing off at this time. Please let me know if additional services are needed.     Follow Up Recommendations Other (comment)    Equipment Recommendations  None recommended by PT    Recommendations for Other Services  (Pt will benefit from passive range of motion as tolerated but is inappropriate for skilled PT services due to inability to participate and confusion at  baseline. )     Precautions / Restrictions Precautions Precautions: None Restrictions Weight Bearing Restrictions: Yes RLE Weight Bearing: Weight bearing as tolerated      Mobility  Bed Mobility               General bed mobility comments: not attempted, pt is drowsy and does not follow commands, is confused c advanced demetia at baseline; not appropriate for OOB at this time.   Transfers                    Ambulation/Gait                Stairs            Wheelchair Mobility    Modified Rankin (Stroke Patients Only)       Balance                                             Pertinent Vitals/Pain Pain Assessment: No/denies pain (No consistent verbal or gestural evidence of pain; following HR increases and guarding for pain response. ) Pain Intervention(s): Monitored during session;Limited activity within patient's tolerance    Home Living Family/patient expects to be discharged to:: Assisted living                 Additional Comments: Pt is a long-term-care resident at [facility].     Prior Function Level of Independence: Needs assistance               Hand  Dominance        Extremity/Trunk Assessment   Upper Extremity Assessment: Difficult to assess due to impaired cognition           Lower Extremity Assessment: Difficult to assess due to impaired cognition         Communication   Communication: Expressive difficulties;Receptive difficulties  Cognition Arousal/Alertness: Awake/alert Behavior During Therapy: Flat affect;Anxious Overall Cognitive Status: History of cognitive impairments - at baseline       Memory: Decreased short-term memory;Decreased recall of precautions              General Comments      Exercises Other Exercises Other Exercises: Hip Abduction PROM on R x10; limited by muscle guarding.  Other Exercises: Heel Slides PROM on R x10; limited by muscle guarding.  Other  Exercises: SAQ on R x10; tolerated well; knee TKE is WNL, no patent knee effusion.       Assessment/Plan    PT Assessment Patent does not need any further PT services  PT Diagnosis Generalized weakness;Acute pain;Altered mental status   PT Problem List Decreased cognition;Decreased mobility;Cardiopulmonary status limiting activity;Decreased range of motion;Decreased strength;Pain  PT Treatment Interventions     PT Goals (Current goals can be found in the Care Plan section) Acute Rehab PT Goals PT Goal Formulation: All assessment and education complete, DC therapy    Frequency     Barriers to discharge        Co-evaluation               End of Session Equipment Utilized During Treatment: Oxygen Activity Tolerance: Patient limited by pain;Treatment limited secondary to agitation Patient left: in bed Nurse Communication: Mobility status;Other (comment)         [id number] PT Time Calculation (min) (ACUTE ONLY): 10 min   Charges:   PT Evaluation $PT Eval High Complexity: 1 Procedure PT Treatments $Therapeutic Activity: 8-22 mins   PT G Codes:        9:50 AM, 2015/11/09 Etta Grandchild, PT, DPT PRN Physical Therapist at [facility] License # [id number] (wireless)  [phone] (mobile)

## 2015-11-03 ENCOUNTER — Encounter (HOSPITAL_COMMUNITY)
Admission: RE | Admit: 2015-11-03 | Discharge: 2015-11-03 | Disposition: A | Discharge status: Home / Self Care | Payer: Medicare Other | Source: Skilled Nursing Facility | Attending: Internal Medicine | Admitting: Internal Medicine

## 2015-11-03 DIAGNOSIS — E785 Hyperlipidemia, unspecified: Secondary | ICD-10-CM | POA: Insufficient documentation

## 2015-11-03 DIAGNOSIS — Z79899 Other long term (current) drug therapy: Secondary | ICD-10-CM | POA: Insufficient documentation

## 2015-11-03 DIAGNOSIS — E860 Dehydration: Secondary | ICD-10-CM | POA: Insufficient documentation

## 2015-11-03 LAB — MAGNESIUM: Magnesium: 1.6 mg/dL — ABNORMAL LOW (ref 1.7–2.4)

## 2015-11-03 LAB — BASIC METABOLIC PANEL
Anion gap: 8 (ref 5–15)
BUN: 33 mg/dL — ABNORMAL HIGH (ref 6–20)
CO2: 27 mmol/L (ref 22–32)
CREATININE: 1.25 mg/dL — AB (ref 0.44–1.00)
Calcium: 8.8 mg/dL — ABNORMAL LOW (ref 8.9–10.3)
Chloride: 107 mmol/L (ref 101–111)
GFR calc Af Amer: 45 mL/min — ABNORMAL LOW (ref >60)
GFR calc non Af Amer: 39 mL/min — ABNORMAL LOW (ref >60)
GLUCOSE: 134 mg/dL — AB (ref 65–99)
Potassium: 3.7 mmol/L (ref 3.5–5.1)
Sodium: 142 mmol/L (ref 135–145)

## 2015-11-04 ENCOUNTER — Encounter: Payer: Self-pay | Admitting: Internal Medicine

## 2015-11-04 ENCOUNTER — Non-Acute Institutional Stay (SKILLED_NURSING_FACILITY): Payer: Medicare Other | Admitting: Internal Medicine

## 2015-11-04 DIAGNOSIS — S72001D Fracture of unspecified part of neck of right femur, subsequent encounter for closed fracture with routine healing: Secondary | ICD-10-CM

## 2015-11-04 DIAGNOSIS — E876 Hypokalemia: Secondary | ICD-10-CM

## 2015-11-04 DIAGNOSIS — F0280 Dementia in other diseases classified elsewhere without behavioral disturbance: Secondary | ICD-10-CM | POA: Diagnosis not present

## 2015-11-04 DIAGNOSIS — E785 Hyperlipidemia, unspecified: Secondary | ICD-10-CM

## 2015-11-04 DIAGNOSIS — G309 Alzheimer's disease, unspecified: Secondary | ICD-10-CM | POA: Diagnosis not present

## 2015-11-04 DIAGNOSIS — M810 Age-related osteoporosis without current pathological fracture: Secondary | ICD-10-CM | POA: Diagnosis not present

## 2015-11-05 ENCOUNTER — Encounter (HOSPITAL_COMMUNITY)
Admission: RE | Admit: 2015-11-05 | Discharge: 2015-11-05 | Disposition: A | Discharge status: Home / Self Care | Payer: Medicare Other | Source: Skilled Nursing Facility | Attending: Internal Medicine | Admitting: Internal Medicine

## 2015-11-05 LAB — BASIC METABOLIC PANEL
ANION GAP: 8 (ref 5–15)
BUN: 40 mg/dL — AB (ref 6–20)
CO2: 28 mmol/L (ref 22–32)
Calcium: 9.2 mg/dL (ref 8.9–10.3)
Chloride: 109 mmol/L (ref 101–111)
Creatinine, Ser: 1.13 mg/dL — ABNORMAL HIGH (ref 0.44–1.00)
GFR, EST AFRICAN AMERICAN: 51 mL/min — AB (ref >60)
GFR, EST NON AFRICAN AMERICAN: 44 mL/min — AB (ref >60)
Glucose, Bld: 142 mg/dL — ABNORMAL HIGH (ref 65–99)
POTASSIUM: 4.3 mmol/L (ref 3.5–5.1)
SODIUM: 145 mmol/L (ref 135–145)

## 2015-11-06 ENCOUNTER — Encounter (HOSPITAL_COMMUNITY)
Admission: AD | Admit: 2015-11-06 | Discharge: 2015-11-06 | Disposition: A | Discharge status: Home / Self Care | Payer: Medicare Other | Source: Skilled Nursing Facility | Attending: Internal Medicine | Admitting: Internal Medicine

## 2015-11-06 ENCOUNTER — Ambulatory Visit: Payer: Medicare Other | Admitting: Urology

## 2015-11-06 ENCOUNTER — Encounter: Payer: Self-pay | Admitting: Internal Medicine

## 2015-11-06 ENCOUNTER — Non-Acute Institutional Stay (SKILLED_NURSING_FACILITY): Payer: Medicare Other | Admitting: Internal Medicine

## 2015-11-06 DIAGNOSIS — G309 Alzheimer's disease, unspecified: Secondary | ICD-10-CM

## 2015-11-06 DIAGNOSIS — S72001D Fracture of unspecified part of neck of right femur, subsequent encounter for closed fracture with routine healing: Secondary | ICD-10-CM

## 2015-11-06 DIAGNOSIS — F0280 Dementia in other diseases classified elsewhere without behavioral disturbance: Secondary | ICD-10-CM

## 2015-11-06 DIAGNOSIS — R627 Adult failure to thrive: Secondary | ICD-10-CM | POA: Diagnosis not present

## 2015-11-06 LAB — BASIC METABOLIC PANEL
Anion gap: 9 (ref 5–15)
BUN: 47 mg/dL — ABNORMAL HIGH (ref 6–20)
CALCIUM: 8.4 mg/dL — AB (ref 8.9–10.3)
CO2: 26 mmol/L (ref 22–32)
CREATININE: 1.06 mg/dL — AB (ref 0.44–1.00)
Chloride: 109 mmol/L (ref 101–111)
GFR calc non Af Amer: 48 mL/min — ABNORMAL LOW (ref >60)
GFR, EST AFRICAN AMERICAN: 55 mL/min — AB (ref >60)
Glucose, Bld: 126 mg/dL — ABNORMAL HIGH (ref 65–99)
Potassium: 3.6 mmol/L (ref 3.5–5.1)
SODIUM: 144 mmol/L (ref 135–145)

## 2015-11-06 LAB — CBC WITH DIFFERENTIAL/PLATELET
BASOS PCT: 0 %
Basophils Absolute: 0 10*3/uL (ref 0.0–0.1)
EOS ABS: 0 10*3/uL (ref 0.0–0.7)
Eosinophils Relative: 0 %
HCT: 34.7 % — ABNORMAL LOW (ref 36.0–46.0)
HEMOGLOBIN: 11.1 g/dL — AB (ref 12.0–15.0)
Lymphocytes Relative: 8 %
Lymphs Abs: 0.9 10*3/uL (ref 0.7–4.0)
MCH: 28.9 pg (ref 26.0–34.0)
MCHC: 32 g/dL (ref 30.0–36.0)
MCV: 90.4 fL (ref 78.0–100.0)
MONOS PCT: 8 %
Monocytes Absolute: 0.9 10*3/uL (ref 0.1–1.0)
NEUTROS PCT: 84 %
Neutro Abs: 8.8 10*3/uL — ABNORMAL HIGH (ref 1.7–7.7)
PLATELETS: 243 10*3/uL (ref 150–400)
RBC: 3.84 MIL/uL — ABNORMAL LOW (ref 3.87–5.11)
RDW: 15.1 % (ref 11.5–15.5)
WBC: 10.5 10*3/uL (ref 4.0–10.5)

## 2015-11-06 NOTE — Progress Notes (Signed)
Patient ID: Kathryn Hamilton, female   DOB: 11-Jan-1931, 82 y.o.   MRN: [id number]     This is an acute visit.  Level care skilled.  Facility [facility].  Chief complaint acute visit secondary to failure to thrive status post hip fracture with repair  History of present illness.  Patient is a 82 year old female who apparently fell at the nursing facility but initially did not complain of pain-however the next day she did complain of pain and x-ray showed a right hip fracture.  Subsequently she underwent an ORIF without complications.  She did have postop some coughs and shortness of breath saw to be CHF-her IV fluids were discontinued and she was given IV Lasix with improvement in symptoms. Her troponins were mildly elevated this was thought secondary to demand ischemia.  She was also placed on aspirin for DVT prophylaxi She was found to have a UTI and given IV Cipro and then transitioned to Avelox  Patient is eating and drinking very poorly she is also increasingly somnolent-she appears to be declining fairly significantly.  She has received approximately 3 L of IV fluids-.  I did speak with her husband today-he is aware of her status and does not want aggressive treatment other than IVs for now.  There is a hospice consult slated for tomorrow.         Previous medical history.  Closed fracture neck of right femur status post ORIF.  All timers disease with significant dementia.  Osteoporosis.  History UTIs.  Hyperlipidemia.  Colon cancer.  Past surgical history.  History colon surgery.  Also history of left hip pinning internal fixation of the left hip this was back in October  Social history.  Patient quit smoking about 42 years ago no history of smokeless tobacco use alcohol or illicit drug use-she had lived at home previously with her husband before breaking her left hip back in [place] been at [facility] since then after her  hospitalization.  Family history significant for heart disease in her father.  Medications  Tylenol 650 g every 6 hours when necessary pain.  Lipitor 40 mg daily.  Os-Cal 1500 mg daily.  Vitamin D 1000 units daily.  Magnesium oxide 30 mL daily when necessary constipation.  Namenda 10 mg twice a day.  Prilosec 20 mg daily at bedtime.  Senokot 1 tab daily.  Review of systems please see history of present illness essentially unattainable secondary to severe dementia.  Physical exam.  Temperature is 97.3 pulse 93 respirations 16 blood pressure 130/76 O2 saturation 97%  In general this is a frail elderly female in no distress resting in bed she is somewhat somnolent.--When opens her eyes she does not really make eye contact  Her skin is warm and dry surgical site right hip is currently covered with dressing.  Eyes-pupils appear reactive to light sclera and conjunctiva are clear visual acuity appears grossly intact.  Oropharynx-mucous membranes appear somewhat dry  Chest is clear to auscultation there is poor respiratory effort she does not really follow verbal commands.  Heart is regular rate and rhythm she does not have significant lower extremity edema pedal pulses appear to be intact.  Abdomen is soft nontender positive bowel sounds.  Muscle skeletal again she does have covering over her right hip surgical site-he is very frail do not note any deformities other than arthritic.  Neurologic she does have severe dementia I do not see any lateralizing findings she is not really speaking.  Psych again she does have severe dementia.  Labs  11/06/2015.  Sodium 144 potassium 3.6 BUN 47 creatinine 1.06.  WBC 10.5 hemoglobin 11.1 platelets 243.  11/02/2015.  WBC 7.5 hemoglobin 11.2 platelets 197.  Sodium 142 potassium 2.9 BUN 21 creatinine 1.1.  Assessment and plan.   History failure to thrive status post right hip fracture with repair-I did speak with her  husband via phone-he does not want aggressive intervention would prefer a hospice palliativef care consult which has been ordered.  He would like IV fluids for short duration which patient has received-her labs actually look fairly stable but I suspect this reflects the temporary benefit  of the IVs and her husband is aware of this  Will await hospice consult-she does not appear to be in pain she is receiving Tylenol for this-.  [id number] note greater than 25 minutes spent assessing patient-reviewing her chart-discussing her status with nursing as well as with her husband-and coordinating plan of care-of note greater than 50% of time spent coordinating plan of care with nursing and family input  .

## 2015-11-08 NOTE — Progress Notes (Addendum)
Patient ID: Kathryn Hamilton, female   DOB: 17-Jan-1931, 82 y.o.   MRN: [id number]                HISTORY & PHYSICAL  DATE:  11/04/2015         FACILITY: [facility]                  LEVEL OF CARE:   SNF   CHIEF COMPLAINT:  Review, status post readmission to the hospital from 10/29/2015 through 11/02/2015.    HISTORY OF PRESENT ILLNESS:  This is a patient whom I admitted to the building with a left hip ORIF on 07/16/2015.  Her postoperative course at that point was unremarkable.    The patient has end-stage dementia secondary to Alzheimer's disease.  She has remained as an ongoing patient in the facility.    She apparently has suffered a fall.  I am not exactly sure of the circumstances in the building.  She was discovered to have a right hip fracture.  She underwent a right total hip replacement.    She developed some postoperative CHF, felt to be secondary to excessive IV fluid, and did receive IV Lasix.    She was placed on ASA for DVT prophylaxis.    She was given IV Cipro and then Avelox for a UTI.    Her family reports that she is not eating well since her return to the facility and postoperatively.    PAST MEDICAL HISTORY/PROBLEM LIST: Past Medical History  Diagnosis Date  . Osteoporosis   . Alzheimer disease   . HLD (hyperlipidemia)   . Colon cancer ([place]) 1996    PAST SURGICAL HISTORY:    Past Surgical History  Procedure Laterality Date  . Colon surgery    . Hip pinning,cannulated Left 07/16/2015    Procedure: CANNULATED HIP PINNING /INTERNAL FIXATION LEFT HIP;  Surgeon: Carole Civil, MD;  Location: [facility];  Service: Orthopedics;  Laterality: Left;  . Hip pinning,cannulated Right 10/31/2015    Procedure: INTERNAL FIXATION RIGHT HIP;  Surgeon: Carole Civil, MD;  Location: [facility];  Service: Orthopedics;  Laterality: Right;    CURRENT MEDICATIONS:  Medication list is reviewed.          Avelox 400 mg daily for five days.    Tylenol 650 q.6  hours p.r.n.      Lipitor 40 q.d.      Os-Cal 1500 daily.    Vitamin D 1000 U daily.      Milk of Magnesia 40 mL p.r.n. constipation.     Namenda 10 mg b.i.d.       Prilosec 20 q.d.       Senna S 8.6 daily.    SOCIAL HISTORY:                   HOUSING:  As mentioned, the patient is a long-term resident in the facility.   ADVANCED DIRECTIVES:   She has a DNR in place.     FAMILY HISTORY:    Family History  Problem Relation Age of Onset  . Heart disease Father        REVIEW OF SYSTEMS:   Not possible from the patient due to the severity of her dementia.  However, her family reports that she is not eating well.     PHYSICAL EXAMINATION:   VITAL SIGNS:     PULSE:  92.      RESPIRATIONS:  20, and unlabored.  02 SATURATIONS:  94% on 2 L.   GENERAL APPEARANCE:   The patient has oxygen on.  I do not believe she had this on previously.   CHEST/RESPIRATORY:  Shallow air entry bilaterally.     CARDIOVASCULAR:   CARDIAC:  Heart sounds are normal.  She does not appear to be dehydrated.     GASTROINTESTINAL:   ABDOMEN:  Somewhat distended.   No masses.     LIVER/SPLEEN/KIDNEY:   No liver, no spleen.  No tenderness.    GENITOURINARY:   BLADDER:  No suprapubic or costovertebral angle tenderness.    MUSCULOSKELETAL:    EXTREMITIES:   RIGHT LOWER EXTREMITY:   Her new right hip incision has sutures, well healed.  No evidence of infection.   CIRCULATION:   EDEMA/VARICOSITIES:  Extremities:  No evidence of a DVT.     NEUROLOGICAL:  Advanced Alzheimer's disease.   The patient is not communicative, although does seem to react to her husband.    ASSESSMENT/PLAN:                       Status post right hip fracture.  Status post right total hip replacement.    She was sent back to [place] with a potassium of 2.9.  This has already been addressed.  Follow-up is tomorrow.  This was also 3.7 on 11/03/2015, probably secondary to the free fluid kaliuresis and IV Lasix that she received in the  hospital.     Advanced Alzheimer's disease.   The oral intake may become an issue here, although I watched her eat and she seemed to be able to swallow two small bites without much issue.    Osteoporosis.  We knew this from previously.  She is on calcium and vitamin D.  She chews her medications.  Therefore, this would preclude a lot of osteoporosis medications.    History of hyperlipidemia.  She is no longer on treatment for this and this is appropriate.     CPT CODE: 91478

## 2015-11-10 ENCOUNTER — Non-Acute Institutional Stay (SKILLED_NURSING_FACILITY): Payer: Medicare Other | Admitting: Internal Medicine

## 2015-11-10 DIAGNOSIS — F0280 Dementia in other diseases classified elsewhere without behavioral disturbance: Secondary | ICD-10-CM

## 2015-11-10 DIAGNOSIS — G309 Alzheimer's disease, unspecified: Secondary | ICD-10-CM

## 2015-11-10 DIAGNOSIS — S72001D Fracture of unspecified part of neck of right femur, subsequent encounter for closed fracture with routine healing: Secondary | ICD-10-CM

## 2015-11-10 DIAGNOSIS — R627 Adult failure to thrive: Secondary | ICD-10-CM

## 2015-11-12 ENCOUNTER — Encounter (HOSPITAL_COMMUNITY)
Admission: RE | Admit: 2015-11-12 | Discharge: 2015-11-12 | Disposition: A | Discharge status: Home / Self Care | Payer: Medicare Other | Source: Skilled Nursing Facility | Attending: Internal Medicine | Admitting: Internal Medicine

## 2015-11-12 LAB — CBC
HCT: 34.5 % — ABNORMAL LOW (ref 36.0–46.0)
Hemoglobin: 11.1 g/dL — ABNORMAL LOW (ref 12.0–15.0)
MCH: 28.9 pg (ref 26.0–34.0)
MCHC: 32.2 g/dL (ref 30.0–36.0)
MCV: 89.8 fL (ref 78.0–100.0)
PLATELETS: 393 10*3/uL (ref 150–400)
RBC: 3.84 MIL/uL — AB (ref 3.87–5.11)
RDW: 15.3 % (ref 11.5–15.5)
WBC: 8.3 10*3/uL (ref 4.0–10.5)

## 2015-11-12 LAB — BASIC METABOLIC PANEL
Anion gap: 9 (ref 5–15)
BUN: 22 mg/dL — AB (ref 6–20)
CALCIUM: 8.7 mg/dL — AB (ref 8.9–10.3)
CHLORIDE: 104 mmol/L (ref 101–111)
CO2: 28 mmol/L (ref 22–32)
Creatinine, Ser: 0.9 mg/dL (ref 0.44–1.00)
GFR calc Af Amer: >60 mL/min (ref >60)
GFR calc non Af Amer: 58 mL/min — ABNORMAL LOW (ref >60)
GLUCOSE: 103 mg/dL — AB (ref 65–99)
Potassium: 4 mmol/L (ref 3.5–5.1)
Sodium: 141 mmol/L (ref 135–145)

## 2015-11-13 ENCOUNTER — Encounter: Payer: Self-pay | Admitting: Internal Medicine

## 2015-11-13 NOTE — Progress Notes (Signed)
Patient ID: Kathryn Hamilton, female   DOB: 12-23-1930, 82 y.o.   MRN: [id number]      This is an acute visit.  Level care skilled.  Facility [facility] Chief complaint acute visit follow-up failure to thrive-right hip fracture with repair  History of present illness.  Patient is a 82 year old female who apparently fell at the nursing facility but initially did not complain of pain-however the next day she did complain of pain and x-ray showed a right hip fracture.  Subsequently she underwent an ORIF without complications.  She did have postop some coughs and shortness of breath saw to be CHF-her IV fluids were discontinued and she was given IV Lasix with improvement in symptoms. Her troponins were mildly elevated this was thought secondary to dema nd ischemia.  She was also placed on aspirin for DVT prophylaxis--although I did not see that it is on her MAR--it appears intention by orthopedics was for aspirin to continue at facility-however it did not appear apparently on the hospital discharge list  Patient was eating and drinking very poorly she also was increasingly somnolent-.  She  received approximately 3 L of IV fluids-.   I did speak with her husband previously-he did not really want any aggressive intervention other than the IV fluids-and hospice consult was ordered which apparently has been completed although I do not know the exact outcome of it at this time.  Nonetheless patient appears to have taking a turn for the better here she is now more bright and alert eating and drinking significantly better although still continues to be quite fragile.     Previous medical history.  Closed fracture neck of right femur status post ORIF.  Alsignificant dementia.  Osteoporosis.  History UTIs.  Hyperlipidemia.  Colon cancer.  Past surgical history.  History colon surgery.  Also history of left hip pinning internal fixation of the left hip this was back in  October  Social history.  Patient quit smoking about 42 years ago no history of smokeless tobacco use alcohol or illicit drug use-she had lived at home previously with her husband before breaking her left hip back in [place] been at [facility] since then after her hospitalization.  Family history significant for heart disease in her father.  Medications  Tylenol 650 g every 6 hours when necessary pain.  Lipitor 40 mg daily.  Os-Cal 1500 mg daily.  Vitamin D 1000 units daily.  Magnesium oxide 30 mL daily when necessary constipation.  Namenda 10 mg twice a day.  Prilosec 20 mg daily at bedtime.  Senokot 1 tab daily.  Review of systems please see history of present illness essentially unattainable secondary to severe dementia.  Physical exam.  T-98.3 P-98 R-20 BP-116/62  In general this is a frail elderly female in no distress --eyes open..more alert-making eye contact --smiling   Her skin is warm and dry surgical site right hip is currently covered with dressing.  Eyes-pupils appear reactive to light sclera and conjunctiva are clear visual acuity appears grossly intact.  Oropharynx-mucous membranes appear more moist  Chest is clear to auscultation there is poor respiratory effort she does not really follow verbal commands.  Heart is regular rate and rhythm she does not have significant lower extremity edema   Abdomen is soft nontender positive bowel sounds.  Muscle skeletal again she does have covering over her right hip surgical site- is very frail do not note any deformities other than arthritic.  Neurologic she does have severe dementia  I do not see any lateralizing findings she is not really speaking--but appears to attempt to speak.--certainly more alert than previous exam     Labs  11/06/2015.  Sodium 144 potassium 3.6 BUN 47 creatinine 1.06.  WBC 10.5 hemoglobin 11.1 platelets 243.  11/02/2015.  WBC 7.5 hemoglobin 11.2 platelets  197.  Sodium 142 potassium 2.9 BUN 21 creatinine 1.1.  Assessment and plan.   History failure to thrive status post right hip fracture with repair-she has she has made some fairly significant improvement here-she is eating and drinking better more alert making eye contact-at this point continue to monitor-not sure of the status of hospice consult her she was deemed hospice eligible-nonetheless clinically she appears to be doing better-will update lab work Today including a CBC and metabolic panel certainly continue to encourage her by mouth intake.  In regards anticoagulation appears intention was for her to be on aspirin for anticoagulation DVT prophylaxis will start this 325 mg a day and monitor.  [id number]   .

## 2015-11-21 ENCOUNTER — Telehealth: Payer: Self-pay | Admitting: Orthopedic Surgery

## 2015-11-21 NOTE — Telephone Encounter (Signed)
Noted  

## 2015-11-21 NOTE — Telephone Encounter (Signed)
Call received from [facility] per Ione, to schedule hospital follow up appointment, status post right hip, internal fixation 10/31/15 - verified per chart note "Staples out postop day 12; follow-up visit postop day 28 with Xrays" -- appointment has been scheduled.  Per Mavis, staples have not yet been removed.  Dr Harrison's note printed and faxed to [facility] regarding staple removal.  Also included in note is aspirin for DVT prevention.  Please advise. Ph [phone]

## 2015-11-27 ENCOUNTER — Non-Acute Institutional Stay (SKILLED_NURSING_FACILITY): Payer: Medicare Other | Admitting: Internal Medicine

## 2015-11-27 DIAGNOSIS — F0280 Dementia in other diseases classified elsewhere without behavioral disturbance: Secondary | ICD-10-CM

## 2015-11-27 DIAGNOSIS — G309 Alzheimer's disease, unspecified: Secondary | ICD-10-CM | POA: Diagnosis not present

## 2015-11-27 DIAGNOSIS — R627 Adult failure to thrive: Secondary | ICD-10-CM

## 2015-11-27 NOTE — Progress Notes (Signed)
Patient ID: Kathryn Hamilton, female   DOB: 08-09-1931, 82 y.o.   MRN: [id number]       This is an acute visit.  Level care skilled.  [place] Chief complaint acute visit follow-up failure to thrive-  History of present illness.  Patient is an 82 year old female with end-stage dementia was has somewhat of a complicated course recently.  She recently had a history of a right hip fracture that was surgically repaired-since her return in the facility she's had episodes of failure to thrive for she becomes increasingly somnolent unresponsive-I have seen her for this previously-in fact at one point a hospice consult was ordered.  However she bounced back and was eating and drinking better but appear to be doing relatively well considering her fragile status.  However she has taken a turn more for the worse here again-she appears to be declining she is actually lying in bed today not really responding with open mouth breathing not really making any eye contact.  Her husband is here and he again has expressed desires for no aggressive intervention.  We did discuss hospice-but he has decided that that is probably not necessary he appears to be adjusting to this well and feels nursing staff herecan take care of her needs.  She continues on numerous medications including Lipitor Namenda Prilosec vitamin D as well as calcium-I suspect all these can be discontinued in an attempt to minimize meds and her husband is in agreement with this.  He essentially wants comfort care-no IVs no labs no hospitalization.        Previous medical history.  Closed fracture neck of right femur status post ORIF.  Alsignificant dementia.  Osteoporosis.  History UTIs.  Hyperlipidemia.  Colon cancer.  Past surgical history.  History colon surgery.  Also history of left hip pinning internal fixation of the left hip this was back in October  Social history.  Patient quit smoking  about 42 years ago no history of smokeless tobacco use alcohol or illicit drug use-she had lived at home previously with her husband before breaking her left hip back in [place] been at [facility] since then after her hospitalization.  Family history significant for heart disease in her father.  Medications  Tylenol 650 g every 6 hours when necessary pain.  Lipitor 40 mg daily.  Os-Cal 1500 mg daily.  Vitamin D 1000 units daily.  Magnesium oxide 30 mL daily when necessary constipation.  Namenda 10 mg twice a day.  Prilosec 20 mg daily at bedtime.  Senokot 1 tab daily.  Review of systems please see history of present illness essentially unattainable secondary to severe dementia.  Physical exam.  She is afebrile pulse 100 respirations 23 blood pressure 108/64  In general this is a frail elderly female in no distress -but has declined once again she has some open mouth breathing is not really responding to any verbal stimuli   Her skin is warm and dry  Eyes-pupils appear reactive to light  Oropharynx-mucous membranes appeardry  Chest is clear to auscultation there is poor respiratory effort--breathing does not appear be  bored however  Heart is regular rate and rhythm she does not have significant lower extremity edema   Abdomen is soft nontender positive bowel sounds.  Muscle skeletal  General frailty does not really move any of her extremities to commands  Neurologic  She is not really responding today     Labs  11/06/2015.  Sodium 144 potassium 3.6 BUN 47 creatinine  1.06.  WBC 10.5 hemoglobin 11.1 platelets 243.  11/02/2015.  WBC 7.5 hemoglobin 11.2 platelets 197.  Sodium 142 potassium 2.9 BUN 21 creatinine 1.1.  Assessment and plan.  Failure to thrive with end-stage dementia-again patient appears to be once again declining-I did have a discussion with her husband at bedside and will discontinue all of her meds-we'll start Roxanol 5 mg  sublingual every 4 hours when necessary pain-also consider starting Ativan for any associated anxiety which may become necessary here at some point.  Also orders for comfort care with no IVs no labs no hospitalization per her husband's wishes.  Currently she does not appear to be in pain  but I suspect this may be an issue as she further declines-again patient at times will turnaround here which I cannot certainly rule out she has done this before-but this point prognosis appears to be quite poor.  [id number]       .

## 2015-11-28 ENCOUNTER — Non-Acute Institutional Stay (SKILLED_NURSING_FACILITY): Payer: Medicare Other | Admitting: Internal Medicine

## 2015-11-28 DIAGNOSIS — F0390 Unspecified dementia without behavioral disturbance: Secondary | ICD-10-CM

## 2015-11-28 DIAGNOSIS — R627 Adult failure to thrive: Secondary | ICD-10-CM

## 2015-11-30 ENCOUNTER — Ambulatory Visit: Payer: Medicare Other | Admitting: Orthopedic Surgery

## 2015-11-30 ENCOUNTER — Telehealth: Payer: Self-pay | Admitting: Orthopedic Surgery

## 2015-11-30 NOTE — Telephone Encounter (Signed)
Kathryn Hamilton will speak with patient's husband to see if he wants her to come out to appt as she was recently started on comfort measures.

## 2015-11-30 NOTE — Telephone Encounter (Signed)
Mavis from [facility] would like to speak with you regarding this patient.  Please call her at [phone]

## 2015-12-01 ENCOUNTER — Encounter: Payer: Self-pay | Admitting: Internal Medicine

## 2015-12-02 NOTE — Progress Notes (Signed)
Patient ID: Kathryn Hamilton, female   DOB: 1931-08-27, 82 y.o.   MRN: [id number]        This is an acute visit.  Level care skilled.  [place] Chief complaint acute visit follow-up failure to thrive-  History of present illness.  Patient is an 82 year old female with end-stage dementia was has somewhat of a complicated course recently.  She recently had a history of a right hip fracture that was surgically repaired-since her return in the facility she's had episodes of failure to thrive for she becomes increasingly somnolent unresponsive-I have seen her for this previously-in fact at one point a hospice consult was ordered.  However she bounced back and was eating and drinking better but appear to be doing relatively well considering her fragile status.  However yesterday she took  a turn more for the worse here again-she appeared to be declining was essentially unresponsive lying in bed with some open mouth breathing.  We basically discontinued all her medications and started Roxanol as needed for pain.  Again goal was comfort care.  However today once again she appears to have improved fairly significantly she is up in her wheelchair she is eating lunch and in fact ate most of her breakfast according in nursing staff-she is making eye contact still appears extremely weak and frail but has certainly made an improvement internal around from yesterday.  Her husband would like therapy to evaluate her to see if they can do anything to help her strengthening and we will do this although I suspect this may be somewhat of a challenging situation.  Again I did express to husband that this will be a gradual situation it appears with some good days and some not so good days although I suspect there will be some gradual decline and eventually she will not perk up like she has done in the past although it is encouraging that she has done this once again  today          Previous medical history.  Closed fracture neck of right femur status post ORIF.  Alsignificant dementia.  Osteoporosis.  History UTIs.  Hyperlipidemia.  Colon cancer.  Past surgical history.  History colon surgery.  Also history of left hip pinning internal fixation of the left hip this was back in October  Social history.  Patient quit smoking about 42 years ago no history of smokeless tobacco use alcohol or illicit drug use-she had lived at home previously with her husband before breaking her left hip back in [place] been at [facility] since then after her hospitalization.  Family history significant for heart disease in her father.  Medications essentially all have been discontinued.  Roxanol 5 mg every 4 hours when necessary has been started but it does not appear she is really required this  currently  Tylenol 650 g every 6 hours when necessary pain.  Lipitor 40 mg daily.  Os-Cal 1500 mg daily.  Vitamin D 1000 units daily.  Magnesium oxide 30 mL daily when necessary constipation.  Namenda 10 mg twice a day.  Prilosec 20 mg daily at bedtime.  Senokot 1 tab daily.  Review of systems please see history of present illness essentially unattainable secondary to severe dementia.  Physical exam.  She is afebrile pulse90--respirations 18  In general this is a frail elderly female in no distress - Sitting up in her wheelchair being fed appears to be doing relatively well appears about two thirds of her plate is cleared  i   Her skin is warm and dry  Eyes-pupils appear reactive to light  Oropharynx-difficult to assess since she is eating  Chest is clear to auscultation there is poor respiratory effort--breathing does not appear be  bored however  Heart is regular rate and rhythm she does not have significant lower extremity edema   Abdomen is soft nontender positive bowel sounds.  Muscle skeletal  General frailty this  appears to be unchanged  Neurologic  She is up alert does make eye contact does not really speak today     Labs  11/06/2015.  Sodium 144 potassium 3.6 BUN 47 creatinine 1.06.  WBC 10.5 hemoglobin 11.1 platelets 243.  11/02/2015.  WBC 7.5 hemoglobin 11.2 platelets 197.  Sodium 142 potassium 2.9 BUN 21 creatinine 1.1.  Assessment and plan.  Failure to thrive with end-stage dementia Patient has been a significant improvement from yesterday-this appears to be her typical presentation for she has good days and not sedated good days where she appears to be significantly declining-at this point will continue to monitor with supportive care the Roxanol is on board if needed.  We have discontinued pretty much all her meds will restart her aspirin for DVT prophylaxis with a history of recent hip surgery-I did speak with her husband-we will order therapy to see if they can do anything to help with her strengthening although again this may be a challenge.     [id number]       .

## 2015-12-03 ENCOUNTER — Ambulatory Visit: Payer: Medicare Other | Admitting: Orthopedic Surgery

## 2015-12-08 ENCOUNTER — Non-Acute Institutional Stay (SKILLED_NURSING_FACILITY): Payer: Medicare Other | Admitting: Internal Medicine

## 2015-12-08 ENCOUNTER — Encounter (HOSPITAL_COMMUNITY)
Admission: RE | Admit: 2015-12-08 | Discharge: 2015-12-08 | Disposition: A | Discharge status: Home / Self Care | Payer: Medicare Other | Source: Skilled Nursing Facility | Attending: Internal Medicine | Admitting: Internal Medicine

## 2015-12-08 ENCOUNTER — Encounter: Payer: Self-pay | Admitting: Internal Medicine

## 2015-12-08 DIAGNOSIS — B349 Viral infection, unspecified: Secondary | ICD-10-CM | POA: Insufficient documentation

## 2015-12-08 DIAGNOSIS — R05 Cough: Secondary | ICD-10-CM | POA: Diagnosis not present

## 2015-12-08 DIAGNOSIS — R627 Adult failure to thrive: Secondary | ICD-10-CM | POA: Diagnosis not present

## 2015-12-08 DIAGNOSIS — R059 Cough, unspecified: Secondary | ICD-10-CM

## 2015-12-08 LAB — INFLUENZA PANEL BY PCR (TYPE A & B)
H1N1FLUPCR: NOT DETECTED
INFLAPCR: NEGATIVE
Influenza B By PCR: NEGATIVE

## 2015-12-08 NOTE — Progress Notes (Signed)
Patient ID: Kathryn Hamilton, female   DOB: 25-Jan-1931, 82 y.o.   MRN: [id number]         This is an acute visit.  Level care skilled.  [place] Chief complaint acute visit secondary to cough-  History of present illness.  Patient is an 82 year old female with end-stage dementia was has somewhat of a complicated course recently.  She recently had a history of a right hip fracture that was surgically repaired-since her return in the facility she's had episodes of failure to thrive for she becomes increasingly somnolent unresponsive-I have seen her for this previously-in fact at one point a hospice consult was ordered.  However she bounced back and was eating and drinking better but appear to be doing relatively well considering her fragile status.   She continues to have good days and not so good days-recently she has had a string of better  days she is more alert is eating and drinking better--her husband wishes for minimal interventions and essentially more comfort care.  Apparently she has developed a cough talking with nursing staff this was kind of a one time episode apparently when family was feeding her and she coughed apparently there's been no reoccurrence.  Vital signs are stable she is afebrile lung exam is benign although she has poor respiratory effort         Previous medical history.  Closed fracture neck of right femur status post ORIF.  Alsignificant dementia.  Osteoporosis.  History UTIs.  Hyperlipidemia.  Colon cancer.  Past surgical history.  History colon surgery.  Also history of left hip pinning internal fixation of the left hip this was back in October  Social history.  Patient quit smoking about 42 years ago no history of smokeless tobacco use alcohol or illicit drug use-she had lived at home previously with her husband before breaking her left hip back in [place] been at [facility] since then after her  hospitalization.  Family history significant for heart disease in her father.  Medications essentiallymostl have been discontinued.  Roxanol 5 mg every 4 hours when necessary has been started but it does not appear she is really required this  currently   Ativan 0.5 mg every 4 hours when necessary anxiety  She also has been started on Tamiflu empirically since there is flu in facility  .  Review of systems please see history of present illness essentially unattainable secondary to severe dementia.  Physical exam.  Temperature 98.3 pulse 80 respirations 20 blood pressure 106/39 In general this is a frail elderly female in no distress -     Her skin is warm and dry  Eyes-pupils appear reactive to light   Chest is clear to auscultation there is poor respiratory effort--breathing does not appear be  Labored  Heart is regular rate and rhythm she does not have significant lower extremity edema   Abdomen is soft nontender positive bowel sounds.  Muscle skeletal  General frailty this appears to be unchanged  Neurologic  She is up alert does make eye contact does not really speak today     Labs  11/12/2015.  Sodium 141 potassium 4 BUN 22 creatinine 0.9.  WBC 8.3 hemoglobin 11.1 platelets 393  11/06/2015.  Sodium 144 potassium 3.6 BUN 47 creatinine 1.06.  WBC 10.5 hemoglobin 11.1 platelets 243.  11/02/2015.  WBC 7.5 hemoglobin 11.2 platelets 197.  Sodium 142 potassium 2.9 BUN 21 creatinine 1.1.  Assessment and plan.   Cough-physical exam was quite benign  apparently this was a one time episode there is flu in the facility so we will obtain a swab-at this point monitor for any signs of aspiration fever chills or increased congestion I  do not see any of this currently  FTT-again medications have been minimized but she appears to be stable at this point she is eating and drinking--has had a string of good days     [id number]       .

## 2015-12-13 ENCOUNTER — Other Ambulatory Visit: Payer: Self-pay

## 2015-12-13 MED ORDER — MORPHINE SULFATE (CONCENTRATE) 20 MG/ML PO SOLN: 5.0000 mg | ORAL | Status: DC | PRN

## 2015-12-13 NOTE — Telephone Encounter (Signed)
Refill request for morphine 20mg /ml concentrated solution.   Prescription was printed and placed in Dr. Cyndi Lennert folder for signing.

## 2016-01-22 ENCOUNTER — Encounter: Payer: Self-pay | Admitting: Internal Medicine

## 2016-01-22 ENCOUNTER — Non-Acute Institutional Stay (SKILLED_NURSING_FACILITY): Payer: Medicare Other | Admitting: Internal Medicine

## 2016-01-22 DIAGNOSIS — R627 Adult failure to thrive: Secondary | ICD-10-CM

## 2016-01-22 DIAGNOSIS — G309 Alzheimer's disease, unspecified: Secondary | ICD-10-CM | POA: Diagnosis not present

## 2016-01-22 DIAGNOSIS — F0280 Dementia in other diseases classified elsewhere without behavioral disturbance: Secondary | ICD-10-CM

## 2016-01-22 DIAGNOSIS — S72001D Fracture of unspecified part of neck of right femur, subsequent encounter for closed fracture with routine healing: Secondary | ICD-10-CM

## 2016-01-22 NOTE — Progress Notes (Signed)
Patient ID: Kathryn Hamilton, female   DOB: December 27, 1930, 82 y.o.   MRN: [id number]  Location:  [facility] of Service:  SNF (31)  Chevis Pretty, FNP  Patient Care Team: Chevis Pretty, FNP as PCP - General (Nurse Practitioner)  Extended Emergency Contact Information Primary Emergency Contact: Dena Billet Address: [address]          [place], [place] [address] Johnnette Litter of [place] Phone: [phone] Mobile Phone: [phone] Relation: Spouse  Goals of care: Advanced Directive information Advanced Directives 01/22/2016  Does patient have an advance directive? Yes  Type of Advance Directive [place]  Does patient want to make changes to advanced directive? No - Patient declined  Copy of advanced directive(s) in chart? Yes  Pre-existing out of facility DNR order (yellow form or pink MOST form) -     Chief Complaint  Patient presents with  . Medical Management of Chronic Issues   Including end-stage dementia-failure to thrive-osteoporosis-history of right hip fracture HPI:  Pt is a 82 y.o. female seen today for medical management of chronic diseases. As noted above.  Patient has end-stage dementia and failure to thrive issues but actually is had some improvement lately.  She continues to be under very fragile status with good days where she eats better and responds better and other days where she's not as responsive or eating or drinking well.  I have had discussions with her husband and he essentially wants comfort care with no aggressive measures no labs no further IVs her hospitalization.  Today she is resting in bed comfortably sleeping fairly soundly vital signs are stable nursing staff does not report any issues they say this time a day she usually does take a fairly long nap.  She is on minimal medications essentially Ativan as needed for anxiety which probably she occasionally takes she is also on when  necessary sublingual morphine Roxanol which apparently she hardly ever takes.  Regards a right hip fracture this was surgically repaired she does not appear to be having extensive discomfort with this        Past Medical History  Diagnosis Date  . Osteoporosis   . Alzheimer disease   . HLD (hyperlipidemia)   . Colon cancer ([place]) 1996   Past Surgical History  Procedure Laterality Date  . Colon surgery    . Hip pinning,cannulated Left 07/16/2015    Procedure: CANNULATED HIP PINNING /INTERNAL FIXATION LEFT HIP;  Surgeon: Carole Civil, MD;  Location: [facility];  Service: Orthopedics;  Laterality: Left;  . Hip pinning,cannulated Right 10/31/2015    Procedure: INTERNAL FIXATION RIGHT HIP;  Surgeon: Carole Civil, MD;  Location: [facility];  Service: Orthopedics;  Laterality: Right;    Allergies  Allergen Reactions  . Penicillins Swelling    Tongue swells   Medications been reviewed.  Essentially Ativan 0.5 mg every 4 hours when necessary.  Roxanol 5 mg sublingual every 4 hours when necessary   Review of Systems   Is essentially unattainable's please see history of present illness  Immunization History  Administered Date(s) Administered  . Influenza Split 10/23/2011  . Influenza,inj,Quad PF,36+ Mos 08/01/2013, 07/20/2014  . Pneumococcal Conjugate-13 08/16/2014  . Pneumococcal Polysaccharide-23 07/30/2012  . Tdap 07/07/2011  . Zoster 04/18/2010   Pertinent  Health Maintenance Due  Topic Date Due  . DEXA SCAN  08/23/1998  . INFLUENZA VACCINE  05/13/2016  . PNA vac Low Risk Adult  Completed   Fall Risk  03/08/2015 08/16/2014 12/21/2013 11/21/2013 08/10/2013  Falls in the past year? Yes No Yes Yes No  Number falls in past yr: 1 - 1 2 or more -  Injury with Fall? No - Yes - -  Risk for fall due to : - - - Impaired balance/gait -   Functional Status Survey:    Filed Vitals:   01/22/16 1532  BP: 109/57  Pulse: 78  Temp: 96.4 F (35.8 C)  TempSrc: Oral  Resp: 16    Weight: 82 lb 9.6 oz (37.467 kg)   Body mass index is 15.1 kg/(m^2). Physical Exam   In general this is a frail elderly female resting in bed comfortably there is no sign of distress   Her skin is warm and dry.  Eyes pupils appear reactive to light sclera and conjunctiva are clear.  Chest is clear to auscultation with poor respiratory effort she does not really follow verbal commands.  Heart is regular rate and rhythm without murmur gallop or rub.  Abdomen soft nontender positive bowel sounds.  Musculoskeletal is able to move all extremities 4 again with extreme frailty does have a history of the right hip fracture with there did not appear to be acute tenderness with pain when assessed  -neurologic is grossly intact no lateralizing findings limited exam since patient was quite sleepy and did not follow verbal commands.  Psych again she has end-stage dementia with appearss baseline again with failure to thrive issues which appears to have stabilized recently somewhat.    Labs reviewed:  Recent Labs  11/03/15 1130 11/05/15 0615 11/06/15 0700 11/12/15 0600  NA 142 145 144 141  K 3.7 4.3 3.6 4.0  CL 107 109 109 104  CO2 27 28 26 28   GLUCOSE 134* 142* 126* 103*  BUN 33* 40* 47* 22*  CREATININE 1.25* 1.13* 1.06* 0.90  CALCIUM 8.8* 9.2 8.4* 8.7*  MG 1.6*  --   --   --     Recent Labs  03/08/15 1233 08/31/15 1610 10/30/15 0644  AST 21 19 15   ALT 10 11* 13*  ALKPHOS 79 85 69  BILITOT 0.8 0.7 0.9  PROT 6.2 5.5* 5.6*  ALBUMIN 3.7 2.7* 2.4*    Recent Labs  08/31/15 1610 10/29/15 1618  11/02/15 0620 11/06/15 0700 11/12/15 0600  WBC 6.4 9.4  < > 7.5 10.5 8.3  NEUTROABS 4.6 8.0*  --   --  8.8*  --   HGB 10.6* 10.5*  < > 11.2* 11.1* 11.1*  HCT 32.4* 32.3*  < > 34.8* 34.7* 34.5*  MCV 94.7 89.0  < > 89.2 90.4 89.8  PLT 199 247  < > 197 243 393  < > = values in this interval not displayed. No results found for: TSH Lab Results  Component Value Date   HGBA1C  5.2% 01/09/2014   Lab Results  Component Value Date   CHOL 126 03/08/2015   HDL 59 03/08/2015   LDLCALC 48 12/21/2013   TRIG 66 03/08/2015    Significant Diagnostic Results in last 30 days:  No results found.  Assessment/Plan  1 history of failure to thrive with end-stage dementia-actually patient appears to be doing quite well-again emphasis is on comfort care she does not appear to be uncomfortable she does have Roxanol needed apparently has not really needed this-she also has Ativan for any possible agitation or distress which apparently has not been needed as well.  #2 history of right hip fracture that was surgically repaired she does not  appear to be in pain appears to have tolerated this fairly well.  History of osteoporosis again she is on minimal medications with emphasis on comfort care.  [id number]

## 2016-01-27 NOTE — Progress Notes (Signed)
Patient ID: Kathryn Hamilton, female   DOB: 03/02/1931, 82 y.o.   MRN: [id number]

## 2016-02-15 ENCOUNTER — Encounter: Payer: Self-pay | Admitting: Internal Medicine

## 2016-02-15 NOTE — Progress Notes (Signed)
Location:  [facility] of Service:  SNF [phone]) Provider:  Leane Para, FNP  Patient Care Team: Chevis Pretty, FNP as PCP - General (Nurse Practitioner)  Extended Emergency Contact Information Primary Emergency Contact: Dena Billet Address: [address]          [place], [place] [address] Johnnette Litter of [place] Phone: [phone] Mobile Phone: [phone] Relation: Spouse  Code Status:   Goals of care: Advanced Directive information Advanced Directives 01/22/2016  Does patient have an advance directive? Yes  Type of Advance Directive [place]  Does patient want to make changes to advanced directive? No - Patient declined  Copy of advanced directive(s) in chart? Yes  Pre-existing out of facility DNR order (yellow form or pink MOST form) -     No chief complaint on file.   HPI:  Pt is a 82 y.o. female seen today for an acute visit for    Past Medical History  Diagnosis Date  . Osteoporosis   . Alzheimer disease   . HLD (hyperlipidemia)   . Colon cancer ([place]) 1996   Past Surgical History  Procedure Laterality Date  . Colon surgery    . Hip pinning,cannulated Left 07/16/2015    Procedure: CANNULATED HIP PINNING /INTERNAL FIXATION LEFT HIP;  Surgeon: Carole Civil, MD;  Location: [facility];  Service: Orthopedics;  Laterality: Left;  . Hip pinning,cannulated Right 10/31/2015    Procedure: INTERNAL FIXATION RIGHT HIP;  Surgeon: Carole Civil, MD;  Location: [facility];  Service: Orthopedics;  Laterality: Right;    Allergies  Allergen Reactions  . Penicillins Swelling    Tongue swells      Medication List    Notice    This visit is during an admission. Changes to the med list made in this visit will be reflected in the After Visit Summary of the admission.      Review of Systems  Constitutional: Negative.   HENT: Negative.   Eyes: Negative.   Respiratory: Negative.     Cardiovascular: Negative.   Gastrointestinal: Negative.   Endocrine: Negative.   Genitourinary: Negative.   Musculoskeletal: Negative.   Skin: Negative.   Allergic/Immunologic: Negative.   Neurological: Negative.   Hematological: Negative.   Psychiatric/Behavioral: Negative.     Immunization History  Administered Date(s) Administered  . Influenza Split 10/23/2011  . Influenza,inj,Quad PF,36+ Mos 08/01/2013, 07/20/2014  . Pneumococcal Conjugate-13 08/16/2014  . Pneumococcal Polysaccharide-23 07/30/2012  . Tdap 07/07/2011  . Zoster 04/18/2010   Pertinent  Health Maintenance Due  Topic Date Due  . DEXA SCAN  08/23/1998  . INFLUENZA VACCINE  05/13/2016  . PNA vac Low Risk Adult  Completed   Fall Risk  03/08/2015 08/16/2014 12/21/2013 11/21/2013 08/10/2013  Falls in the past year? Yes No Yes Yes No  Number falls in past yr: 1 - 1 2 or more -  Injury with Fall? No - Yes - -  Risk for fall due to : - - - Impaired balance/gait -   Functional Status Survey:    There were no vitals filed for this visit. There is no weight on file to calculate BMI. Physical Exam  Constitutional: She is oriented to person, place, and time. She appears well-developed and well-nourished. No distress.  HENT:  Head: Normocephalic.  Nose: Nose normal.  Mouth/Throat: Oropharynx is clear and moist. No oropharyngeal exudate.  Eyes: Conjunctivae and EOM are normal. Pupils are equal, round, and reactive to light. Right  eye exhibits no discharge.  Neck: Normal range of motion. Neck supple.  Cardiovascular: Normal rate and regular rhythm.  Exam reveals no gallop and no friction rub.   No murmur heard. Pulmonary/Chest: Effort normal and breath sounds normal. She has no wheezes. She has no rales. She exhibits no tenderness.  Abdominal: Soft. Bowel sounds are normal. There is no tenderness.  Genitourinary: Vagina normal. No vaginal discharge found.  Musculoskeletal: Normal range of motion. She exhibits no edema.   Neurological: She is alert and oriented to person, place, and time. No cranial nerve deficit.  Skin: Skin is warm and dry.    Labs reviewed:  Recent Labs  11/03/15 1130 11/05/15 0615 11/06/15 0700 11/12/15 0600  NA 142 145 144 141  K 3.7 4.3 3.6 4.0  CL 107 109 109 104  CO2 27 28 26 28   GLUCOSE 134* 142* 126* 103*  BUN 33* 40* 47* 22*  CREATININE 1.25* 1.13* 1.06* 0.90  CALCIUM 8.8* 9.2 8.4* 8.7*  MG 1.6*  --   --   --     Recent Labs  03/08/15 1233 08/31/15 1610 10/30/15 0644  AST 21 19 15   ALT 10 11* 13*  ALKPHOS 79 85 69  BILITOT 0.8 0.7 0.9  PROT 6.2 5.5* 5.6*  ALBUMIN 3.7 2.7* 2.4*    Recent Labs  08/31/15 1610 10/29/15 1618  11/02/15 0620 11/06/15 0700 11/12/15 0600  WBC 6.4 9.4  < > 7.5 10.5 8.3  NEUTROABS 4.6 8.0*  --   --  8.8*  --   HGB 10.6* 10.5*  < > 11.2* 11.1* 11.1*  HCT 32.4* 32.3*  < > 34.8* 34.7* 34.5*  MCV 94.7 89.0  < > 89.2 90.4 89.8  PLT 199 247  < > 197 243 393  < > = values in this interval not displayed. No results found for: TSH Lab Results  Component Value Date   HGBA1C 5.2% 01/09/2014   Lab Results  Component Value Date   CHOL 126 03/08/2015   HDL 59 03/08/2015   LDLCALC 48 12/21/2013   TRIG 66 03/08/2015    Significant Diagnostic Results in last 30 days:  No results found.  Assessment/Plan There are no diagnoses linked to this encounter.      [place] Lewis, PA-C [phone]  This encounter was created in error - please disregard.

## 2016-03-04 ENCOUNTER — Non-Acute Institutional Stay (SKILLED_NURSING_FACILITY): Payer: Medicare Other | Admitting: Internal Medicine

## 2016-03-04 ENCOUNTER — Encounter: Payer: Self-pay | Admitting: Internal Medicine

## 2016-03-04 DIAGNOSIS — M25531 Pain in right wrist: Secondary | ICD-10-CM | POA: Diagnosis not present

## 2016-03-04 DIAGNOSIS — F0390 Unspecified dementia without behavioral disturbance: Secondary | ICD-10-CM | POA: Diagnosis not present

## 2016-03-04 DIAGNOSIS — R627 Adult failure to thrive: Secondary | ICD-10-CM | POA: Diagnosis not present

## 2016-03-04 NOTE — Progress Notes (Signed)
Location:  [facility] Room Number: 125/P Place of Service:  SNF (31) Provider:  Leane Para, FNP  Patient Care Team: Chevis Pretty, FNP as PCP - General (Nurse Practitioner)  Extended Emergency Contact Information Primary Emergency Contact: Dena Billet Address: [address]          [place], [place] [address] Johnnette Litter of [place] Phone: [phone] Mobile Phone: [phone] Relation: Spouse  Code Status:  DNR Goals of care: Advanced Directive information Advanced Directives 03/04/2016  Does patient have an advance directive? Yes  Type of Advance Directive Out of facility DNR (pink MOST or yellow form)  Does patient want to make changes to advanced directive? No - Patient declined  Copy of advanced directive(s) in chart? Yes     Chief Complaint  Patient presents with  . Acute Visit    Patients c/o Right wrist pain    HPI:  Pt is a 82 y.o. female seen today for an acute visit for Right wrist discomfort-apparently there's been no listed history of trauma-patient is a very frail elderly female with a history of end-stage dementia and failure to thrive essentially under comfort care.  Apparently staff has discovered she has some mild edema of her right wrist and this is quite tender to palpation it is not erythematous-patient has severe dementia and cannot really give any review of systems.  She does have a previous history of a right hip fracture status post repair she largely ambulates in a wheelchair-she does not ambulate on her own again secondary to significant weakness.  In regards to failure to thrive she actually appears to be stable she is now often up in the wheelchair does not really communicate secondary to severe dementia but apparently is eating and drinking fairly well except she's lost about 2 pounds since last month but this is not a precipitous decline   Past Medical History  Diagnosis  Date  . Osteoporosis   . Alzheimer disease   . HLD (hyperlipidemia)   . Colon cancer ([facility]) 1996   Past Surgical History  Procedure Laterality Date  . Colon surgery    . Hip pinning,cannulated Left 07/16/2015    Procedure: CANNULATED HIP PINNING /INTERNAL FIXATION LEFT HIP;  Surgeon: Carole Civil, MD;  Location: [facility];  Service: Orthopedics;  Laterality: Left;  . Hip pinning,cannulated Right 10/31/2015    Procedure: INTERNAL FIXATION RIGHT HIP;  Surgeon: Carole Civil, MD;  Location: [facility];  Service: Orthopedics;  Laterality: Right;    Allergies  Allergen Reactions  . Penicillins Swelling    Tongue swells    Current Outpatient Prescriptions on File Prior to Visit  Medication Sig Dispense Refill  . LORazepam (ATIVAN) 2 MG/ML concentrated solution Take 0.5 mg by mouth every 4 (four) hours as needed for anxiety.    Marland Kitchen morphine (ROXANOL) 20 MG/ML concentrated solution Take 0.25 mLs (5 mg total) by mouth every 4 (four) hours as needed for severe pain. 30 mL 0   No current facility-administered medications on file prior to visit.     Review of Systems Essentially unattainable please see history of present illness Immunization History  Administered Date(s) Administered  . Influenza Split 10/23/2011  . Influenza,inj,Quad PF,36+ Mos 08/01/2013, 07/20/2014  . Pneumococcal Conjugate-13 08/16/2014  . Pneumococcal Polysaccharide-23 07/30/2012  . Tdap 07/07/2011  . Zoster 04/18/2010   Pertinent  Health Maintenance Due  Topic Date Due  . DEXA SCAN  03/04/2017 (Originally 08/23/1998)  . INFLUENZA VACCINE  05/13/2016  . PNA vac Low Risk Adult  Completed   Fall Risk  03/08/2015 08/16/2014 12/21/2013 11/21/2013 08/10/2013  Falls in the past year? Yes No Yes Yes No  Number falls in past yr: 1 - 1 2 or more -  Injury with Fall? No - Yes - -  Risk for fall due to : - - - Impaired balance/gait -   Functional Status Survey:    Filed Vitals:   03/04/16 1244  BP: 103/62  Pulse:  68  Temp: 97.9 F (36.6 C)  TempSrc: Oral  Resp: 18  Weight: 80 lb 12.8 oz (36.651 kg)   Body mass index is 14.77 kg/(m^2). Physical Exam In general this is a frail elderly female in no distress.  Her skin is warm and dry I do not see increased bruising from baseline.  Eyes pupils appear reactive to light.  Oropharynx was difficult to assess patient did not really open her mouth.  Chest is clear to auscultation with poor respiratory effort there is no labored breathing.  Heart is regular rate and rhythm without murmur gallop or rub she does not have significant lower extremity edema.  Her abdomen soft nontender positive bowel sounds.  Muscle skeletal does move all extremities 4 with general frailty-there is some mild edema of her right wrist area in proximal dorsum of her hand-this is not erythematous or warm-her radial pulse is intact capillary refill is intact-there is tenderness when there is flexion and extension at her wrist-she does not really follow verbal commands so did not clench her hand.  Neurologic appears grossly intact no lateralizing findings limited secondary to patient not following any verbal commands.  Psych again findings compatible with severe dementia she is awake and alert but does not follow any verbal commands Labs reviewed:  Recent Labs  11/03/15 1130 11/05/15 0615 11/06/15 0700 11/12/15 0600  NA 142 145 144 141  K 3.7 4.3 3.6 4.0  CL 107 109 109 104  CO2 27 28 26 28   GLUCOSE 134* 142* 126* 103*  BUN 33* 40* 47* 22*  CREATININE 1.25* 1.13* 1.06* 0.90  CALCIUM 8.8* 9.2 8.4* 8.7*  MG 1.6*  --   --   --     Recent Labs  03/08/15 1233 08/31/15 1610 10/30/15 0644  AST 21 19 15   ALT 10 11* 13*  ALKPHOS 79 85 69  BILITOT 0.8 0.7 0.9  PROT 6.2 5.5* 5.6*  ALBUMIN 3.7 2.7* 2.4*    Recent Labs  08/31/15 1610 10/29/15 1618  11/02/15 0620 11/06/15 0700 11/12/15 0600  WBC 6.4 9.4  < > 7.5 10.5 8.3  NEUTROABS 4.6 8.0*  --   --  8.8*  --    HGB 10.6* 10.5*  < > 11.2* 11.1* 11.1*  HCT 32.4* 32.3*  < > 34.8* 34.7* 34.5*  MCV 94.7 89.0  < > 89.2 90.4 89.8  PLT 199 247  < > 197 243 393  < > = values in this interval not displayed. No results found for: TSH Lab Results  Component Value Date   HGBA1C 5.2% 01/09/2014   Lab Results  Component Value Date   CHOL 126 03/08/2015   HDL 59 03/08/2015   LDLCALC 48 12/21/2013   TRIG 66 03/08/2015    Significant Diagnostic Results in last 30 days:  No results found.  Assessment/Plan  #1 right wrist discomfort and edema-at this point will obtain an x-ray of the area-for pain she does have as needed Roxanol apparently does not really  take this much if any-.  #2-history of failure to thrive with end-stage dementia at one point it was thought patient's prognosis was very poor but she appears to have responded somewhat here she is often up in the wheelchair h is eating and drinking fairly well relatively speaking this is encouraging although again she is a very fragile person in this regard she is on Roxanol as needed for any pain issues also on when necessary lorazepam as needed for anxiety or respiratory distress which has not been an issue.  [id number] note greater than 25 minutes spent assessing patient-reviewing her chart-discussing her status with nursing staff-and coordinating plan of care--of note greater than 50% of time;spent coordinating plan of care  with above-mentioned input     [place] Man[place], [place]

## 2016-03-05 ENCOUNTER — Non-Acute Institutional Stay (SKILLED_NURSING_FACILITY): Payer: Medicare Other | Admitting: Internal Medicine

## 2016-03-05 ENCOUNTER — Encounter: Payer: Self-pay | Admitting: Internal Medicine

## 2016-03-05 DIAGNOSIS — S62309D Unspecified fracture of unspecified metacarpal bone, subsequent encounter for fracture with routine healing: Secondary | ICD-10-CM | POA: Diagnosis not present

## 2016-03-05 DIAGNOSIS — S62101A Fracture of unspecified carpal bone, right wrist, initial encounter for closed fracture: Secondary | ICD-10-CM | POA: Insufficient documentation

## 2016-03-05 NOTE — Progress Notes (Signed)
Location:  [facility] Room Number: 125/P Place of Service:  SNF (31) Provider:  Leane Para, FNP  Patient Care Team: Chevis Pretty, FNP as PCP - General (Nurse Practitioner)  Extended Emergency Contact Information Primary Emergency Contact: Dena Billet Address: [address]          [place], [place] [address] Johnnette Litter of [place] Phone: [phone] Mobile Phone: [phone] Relation: Spouse  Code Status:  DNR Goals of care: Advanced Directive information Advanced Directives 03/05/2016  Does patient have an advance directive? Yes  Type of Advance Directive Out of facility DNR (pink MOST or yellow form)  Does patient want to make changes to advanced directive? No - Patient declined  Copy of advanced directive(s) in chart? Yes    Chief complaint-acute visit follow-up fifth metacarpal neck fracture   HPI:  Pt is a 82 y.o. female seen today for an acute visit for follow-up of a fifth metacarpal neck fracture on her right hand.  Patient was seen yesterday for some pain in edema of her right wrist-subsequent x-ray showed no acute changes over 4 however addition an acute physical metacarpal neck fracture.  This was discussed with Dr. Linna Darner and we have deferred to physical therapy for follow-up of this-thinking possibly she may need a soft splint.  Follow-up today patient appears to have less edema and less pain in the area-she is sitting in her wheelchair comfortably.  I did discuss the x-ray findings with physical therapy-at this point they suspect she will have conservative treatment stating they do not usually splint this-however they will evaluate her.  .  Again clinically patient actually appears to be improved from yesterday-she does have Roxanol needed for pain she is on essentially comfort care status secondary to failure to thrive with significant dementia   Past Medical History  Diagnosis Date  .  Osteoporosis   . Alzheimer disease   . HLD (hyperlipidemia)   . Colon cancer ([facility]) 1996   Past Surgical History  Procedure Laterality Date  . Colon surgery    . Hip pinning,cannulated Left 07/16/2015    Procedure: CANNULATED HIP PINNING /INTERNAL FIXATION LEFT HIP;  Surgeon: Carole Civil, MD;  Location: [facility];  Service: Orthopedics;  Laterality: Left;  . Hip pinning,cannulated Right 10/31/2015    Procedure: INTERNAL FIXATION RIGHT HIP;  Surgeon: Carole Civil, MD;  Location: [facility];  Service: Orthopedics;  Laterality: Right;    Allergies  Allergen Reactions  . Penicillins Swelling    Tongue swells    Current Outpatient Prescriptions on File Prior to Visit  Medication Sig Dispense Refill  . ENSURE PLUS (ENSURE PLUS) LIQD Give twice a day. Hold if lethargic    . LORazepam (ATIVAN) 2 MG/ML concentrated solution Take 0.5 mg by mouth every 4 (four) hours as needed for anxiety.    Marland Kitchen morphine (ROXANOL) 20 MG/ML concentrated solution Take 0.25 mLs (5 mg total) by mouth every 4 (four) hours as needed for severe pain. 30 mL 0  . Oral Electrolytes (PEDIALYTE ADVANCED CARE PO) Give 8 oz by mouth three times a day. ( Give if alert )     No current facility-administered medications on file prior to visit.     Review of Systems   Essentially unattainable secondary to dementia please see history of present illness  Immunization History  Administered Date(s) Administered  . Influenza Split 10/23/2011  . Influenza,inj,Quad PF,36+ Mos 08/01/2013, 07/20/2014  . Pneumococcal Conjugate-13 08/16/2014  . Pneumococcal Polysaccharide-23  07/30/2012  . Tdap 07/07/2011  . Zoster 04/18/2010   Pertinent  Health Maintenance Due  Topic Date Due  . DEXA SCAN  03/04/2017 (Originally 08/23/1998)  . INFLUENZA VACCINE  05/13/2016  . PNA vac Low Risk Adult  Completed   Fall Risk  03/08/2015 08/16/2014 12/21/2013 11/21/2013 08/10/2013  Falls in the past year? Yes No Yes Yes No  Number falls in past  yr: 1 - 1 2 or more -  Injury with Fall? No - Yes - -  Risk for fall due to : - - - Impaired balance/gait -   Functional Status Survey:    Filed Vitals:   03/05/16 1346  BP: 103/62  Pulse: 68  Temp: 97.9 F (36.6 C)  TempSrc: Oral  Resp: 18  Weight: 80 lb 12.8 oz (36.651 kg)   Body mass index is 14.77 kg/(m^2). Physical Exam   \Gen. this a frail female in no distress sitting comfortably in her wheelchair.  Her skin is warm and dry.  Chest is clear to auscultation.  Heart is regular rate and rhythm limited exam since patient is somewhat agitated with my attempts to auscultate her heart.  Right wrist area appears to have less edema than yesterday and less pain with palpation I do not see any erythema-radial pulse continues to be intact  Labs reviewed:  Recent Labs  11/03/15 1130 11/05/15 0615 11/06/15 0700 11/12/15 0600  NA 142 145 144 141  K 3.7 4.3 3.6 4.0  CL 107 109 109 104  CO2 27 28 26 28   GLUCOSE 134* 142* 126* 103*  BUN 33* 40* 47* 22*  CREATININE 1.25* 1.13* 1.06* 0.90  CALCIUM 8.8* 9.2 8.4* 8.7*  MG 1.6*  --   --   --     Recent Labs  03/08/15 1233 08/31/15 1610 10/30/15 0644  AST 21 19 15   ALT 10 11* 13*  ALKPHOS 79 85 69  BILITOT 0.8 0.7 0.9  PROT 6.2 5.5* 5.6*  ALBUMIN 3.7 2.7* 2.4*    Recent Labs  08/31/15 1610 10/29/15 1618  11/02/15 0620 11/06/15 0700 11/12/15 0600  WBC 6.4 9.4  < > 7.5 10.5 8.3  NEUTROABS 4.6 8.0*  --   --  8.8*  --   HGB 10.6* 10.5*  < > 11.2* 11.1* 11.1*  HCT 32.4* 32.3*  < > 34.8* 34.7* 34.5*  MCV 94.7 89.0  < > 89.2 90.4 89.8  PLT 199 247  < > 197 243 393  < > = values in this interval not displayed. No results found for: TSH Lab Results  Component Value Date   HGBA1C 5.2% 01/09/2014   Lab Results  Component Value Date   CHOL 126 03/08/2015   HDL 59 03/08/2015   LDLCALC 48 12/21/2013   TRIG 66 03/08/2015    Significant Diagnostic Results in last 30 days:  No results  found.  Assessment/Plan   -Right fifth metacarpal neck fracture-this is a new diagnoses-unsure etiology-there's been no history of trauma or fall to my knowledge--she appears to be doing better today-I did discuss this with therapy-they will evaluate her-at this point indicated they probably would not splintbut again they will evaluate this before making a final determination--at this point will monitor again emphasis is on comfort   [place], [place], [place]

## 2016-03-25 ENCOUNTER — Encounter: Payer: Self-pay | Admitting: Internal Medicine

## 2016-03-25 ENCOUNTER — Non-Acute Institutional Stay (SKILLED_NURSING_FACILITY): Payer: Medicare Other | Admitting: Internal Medicine

## 2016-03-25 DIAGNOSIS — R627 Adult failure to thrive: Secondary | ICD-10-CM

## 2016-03-25 DIAGNOSIS — R21 Rash and other nonspecific skin eruption: Secondary | ICD-10-CM

## 2016-03-25 NOTE — Progress Notes (Signed)
Location:  [facility] Room Number: 125/P Place of Service:  SNF (31) Provider:  Leane Para, FNP  Patient Care Team: Chevis Pretty, FNP as PCP - General (Nurse Practitioner)  Extended Emergency Contact Information Primary Emergency Contact: Dena Billet Address: [address]          [place], [place] [address] Johnnette Litter of [place] Phone: [phone] Mobile Phone: [phone] Relation: Spouse  Code Status:  DNR Goals of care: Advanced Directive information Advanced Directives 03/25/2016  Does patient have an advance directive? Yes  Type of Paramedic of [place];Out of facility DNR (pink MOST or yellow form)  Does patient want to make changes to advanced directive? No - Patient declined  Copy of advanced directive(s) in chart? Yes    Chief complaint-Acute visit secondary to recurrent facial rash   HPI:  Pt is a 82 y.o. female seen today f For recurrent facial rash on her cheeks-this has been treated in the past with triamcinolone cream apparently with limited success talking to her nurse today.  Her vital signs continued to be stable she does have a history of failure to thrive with end-stage dementia but actually has done quite well with supportive care although she continues to be very fragile  .      Past Medical History  Diagnosis Date  . Osteoporosis   . Alzheimer disease   . HLD (hyperlipidemia)   . Colon cancer ([facility]) 1996   Past Surgical History  Procedure Laterality Date  . Colon surgery    . Hip pinning,cannulated Left 07/16/2015    Procedure: CANNULATED HIP PINNING /INTERNAL FIXATION LEFT HIP;  Surgeon: Carole Civil, MD;  Location[facility];  Service: Orthopedics;  Laterality: Left;  . Hip pinning,cannulated Right 10/31/2015    Procedure: INTERNAL FIXATION RIGHT HIP;  Surgeon: Carole Civil, MD;  Location: [facility];  Service: Orthopedics;  Laterality:  Right;    Allergies  Allergen Reactions  . Penicillins Swelling    Tongue swells    Current Outpatient Prescriptions on File Prior to Visit  Medication Sig Dispense Refill  . ENSURE PLUS (ENSURE PLUS) LIQD Give twice a day. Hold if lethargic    . LORazepam (ATIVAN) 2 MG/ML concentrated solution Take 0.5 mg by mouth every 4 (four) hours as needed for anxiety.    Marland Kitchen morphine (ROXANOL) 20 MG/ML concentrated solution Take 0.25 mLs (5 mg total) by mouth every 4 (four) hours as needed for severe pain. 30 mL 0  . Oral Electrolytes (PEDIALYTE ADVANCED CARE PO) Give 8 oz by mouth three times a day. ( Give if alert )     No current facility-administered medications on file prior to visit.     Review of Systems   Essentially unattainable secondary to dementia please see history of present illness-There've been no complaints of shortness of breath her uncontrolled pain per nursing  Immunization History  Administered Date(s) Administered  . Influenza Split 10/23/2011  . Influenza,inj,Quad PF,36+ Mos 08/01/2013, 07/20/2014  . Pneumococcal Conjugate-13 08/16/2014  . Pneumococcal Polysaccharide-23 07/30/2012  . Tdap 07/07/2011  . Zoster 04/18/2010   Pertinent  Health Maintenance Due  Topic Date Due  . DEXA SCAN  03/04/2017 (Originally 08/23/1998)  . INFLUENZA VACCINE  05/13/2016  . PNA vac Low Risk Adult  Completed   Fall Risk  03/08/2015 08/16/2014 12/21/2013 11/21/2013 08/10/2013  Falls in the past year? Yes No Yes Yes No  Number falls in past yr: 1 - 1  2 or more -  Injury with Fall? No - Yes - -  Risk for fall due to : - - - Impaired balance/gait -   Functional Status Survey:      Physical Exam  Dr. 98.0 pulse 88 respirations 22 blood pressure 97/42  \Gen. this a frail female in no distress sitting comfortably in her wheelchair.  Her skin is warm and dry. She does have an erythematous rosacea type appearing rash on her cheeks bilaterally--does not appear to be cellulitic  Chest  is clear to auscultation.  Heart is regular rate and rhythm   Neurologic appears grossly intact she does have severe dementia-could not appreciate any lateralizing findings   Labs reviewed:  Recent Labs  11/03/15 1130 11/05/15 0615 11/06/15 0700 11/12/15 0600  NA 142 145 144 141  K 3.7 4.3 3.6 4.0  CL 107 109 109 104  CO2 27 28 26 28   GLUCOSE 134* 142* 126* 103*  BUN 33* 40* 47* 22*  CREATININE 1.25* 1.13* 1.06* 0.90  CALCIUM 8.8* 9.2 8.4* 8.7*  MG 1.6*  --   --   --     Recent Labs  08/31/15 1610 10/30/15 0644  AST 19 15  ALT 11* 13*  ALKPHOS 85 69  BILITOT 0.7 0.9  PROT 5.5* 5.6*  ALBUMIN 2.7* 2.4*    Recent Labs  08/31/15 1610 10/29/15 1618  11/02/15 0620 11/06/15 0700 11/12/15 0600  WBC 6.4 9.4  < > 7.5 10.5 8.3  NEUTROABS 4.6 8.0*  --   --  8.8*  --   HGB 10.6* 10.5*  < > 11.2* 11.1* 11.1*  HCT 32.4* 32.3*  < > 34.8* 34.7* 34.5*  MCV 94.7 89.0  < > 89.2 90.4 89.8  PLT 199 247  < > 197 243 393  < > = values in this interval not displayed. No results found for: TSH Lab Results  Component Value Date   HGBA1C 5.2% 01/09/2014   Lab Results  Component Value Date   CHOL 126 03/08/2015   HDL 59 03/08/2015   LDLCALC 48 12/21/2013   TRIG 66 03/08/2015    Significant Diagnostic Results in last 30 days:  No results found.  Assessment/Plan  Dermatitis unspecified suspect rosacea type presentation of her cheeks-will treat with MetroGel cream 1% apply daily until resolved if no resolution notify provider.  Failure to thrive-patient's weight of 96 pounds appears be trending up she is doing relatively well with supportive care although prognosis still is quite guarded    [place], [place], [place]

## 2016-03-29 DIAGNOSIS — R21 Rash and other nonspecific skin eruption: Secondary | ICD-10-CM | POA: Insufficient documentation

## 2016-04-29 ENCOUNTER — Non-Acute Institutional Stay (SKILLED_NURSING_FACILITY): Payer: Medicare Other | Admitting: Internal Medicine

## 2016-04-29 ENCOUNTER — Encounter: Payer: Self-pay | Admitting: Internal Medicine

## 2016-04-29 DIAGNOSIS — G309 Alzheimer's disease, unspecified: Secondary | ICD-10-CM

## 2016-04-29 DIAGNOSIS — F0280 Dementia in other diseases classified elsewhere without behavioral disturbance: Secondary | ICD-10-CM | POA: Diagnosis not present

## 2016-04-29 DIAGNOSIS — R627 Adult failure to thrive: Secondary | ICD-10-CM

## 2016-04-29 NOTE — Assessment & Plan Note (Signed)
Not candidate for Namenda or Aricept because of end-stage disease

## 2016-04-29 NOTE — Assessment & Plan Note (Signed)
DO NOT RESUSCITATE designation is clinically appropriate in view of her advanced dementia associated with recurrent falls and multiple fractures

## 2016-04-29 NOTE — Progress Notes (Signed)
    Facility Location: [place] Room Number: 125/P    This is a nursing facility follow up of chronic medical diagnosis  Interim medical record and care since last [facility] visit was updated with review of diagnostic studies and change in clinical status since last visit were documented.  HPI: The patient was admitted to [facility] 11/02/15 following a closed fracture of the neck of the right femur. This is in the context of senile osteoporosis. Actually the patient has had bilateral femoral neck fractures well as wrist fracture. The patient has dementia and Alzheimer's disease and has a DO NOT RESUSCITATE designation. She has had mild renal insufficiency with creatinines up to 1.25 and GFR as low as 39.  Post fracture she did have stable anemia with hematocrits in the range of 34.5. She has had mild hyperglycemia; the last A1c was 5.2% on 01/09/14.  Physical exam:  Pertinent or positive findings: She is slack jawed  and completely unresponsive, unable to provide any history. She did resist oral or ocular exam. Arcus senilis is present. Heart sounds are somewhat distant. There is trace ankle edema. She has mixed arthritic changes of the hands involving the DIP and PIP joints. Limb atrophy.  General appearance:Thin but adequately nourished; no acute distress , increased work of breathing is present.   Lymphatic: No lymphadenopathy about the head, neck, axilla . Eyes: No conjunctival inflammation or lid edema is present. There is no scleral icterus. Ears:  External ear exam shows no significant lesions or deformities.   Nose:  External nasal examination shows no deformity or inflammation. Nasal mucosa are pink and moist without lesions ,exudates Oral exam: lips and gums are healthy appearing. Neck:  No thyromegaly, masses, tenderness noted.    Heart: Slight tachycardia; regular rhythm. S1 and S2 normal without gallop, murmur, click, rub .  Lungs:Chest clear to  auscultation without wheezes, rhonchi,rales , rubs. Abdomen:Bowel sounds are normal. Abdomen is soft and nontender with no organomegaly, hernias,masses. GU: deferred. Extremities:  No cyanosis, clubbing.  Neurologic exam : Strength can't be tested Balance,Rhomberg,finger to nose testing could not be completed due to clinical state Skin: Warm & dry w/o tenting. No significant lesions or rash.    See summary under each active problem in the Problem List with associated updated therapeutic plan

## 2016-04-29 NOTE — Patient Instructions (Signed)
No new orders

## 2016-05-28 ENCOUNTER — Non-Acute Institutional Stay (SKILLED_NURSING_FACILITY): Payer: Medicare Other | Admitting: Internal Medicine

## 2016-05-28 DIAGNOSIS — M25531 Pain in right wrist: Secondary | ICD-10-CM | POA: Diagnosis not present

## 2016-05-28 DIAGNOSIS — S72001D Fracture of unspecified part of neck of right femur, subsequent encounter for closed fracture with routine healing: Secondary | ICD-10-CM

## 2016-05-28 DIAGNOSIS — M810 Age-related osteoporosis without current pathological fracture: Secondary | ICD-10-CM | POA: Diagnosis not present

## 2016-05-28 DIAGNOSIS — G309 Alzheimer's disease, unspecified: Secondary | ICD-10-CM | POA: Diagnosis not present

## 2016-05-28 DIAGNOSIS — F0280 Dementia in other diseases classified elsewhere without behavioral disturbance: Secondary | ICD-10-CM

## 2016-05-28 NOTE — Progress Notes (Signed)
This is a routine visit.  Level care skilled.  Facility [facility].  Chief complaint Patient presents with  . Medical Management of Chronic Issues   Including end-stage dementia-failure to thrive-osteoporosis-history of right hip fracture HPI:  Pt is a 82 y.o. female seen today for medical management of chronic diseases. As noted above.  Patient has end-stage dementia and failure to thrive issues but actually Has had a relatively long period of relative stability  She continues to be under very fragile status with good days where she eats better and responds better and other days where she's not as responsive or eating or drinking well.  I have had discussions with her husband and he essentially wants comfort care with no aggressive measures no labs no further IVs her hospitalization.  Today she is resting in bed comfortably She is awake appears to make eye contact but does not really speak  She is on minimal medications essentially Ativan as needed for anxiety which probably she occasionally takes she is also on when necessary sublingual morphine Roxanol which apparently she hardly ever takes.  Regards a right hip fracture this was surgically repaired she does not appear to be having extensive discomfort with this    also recently had a right wrist fracture but she appears to have recovered fairly on remarkably from this she has not really had any issue with this recently with any increased pain  She also is receiving MetroGel for suspected some rosacea changes of her cheeks this appears to be helping some             Past Medical History  Diagnosis Date  . Osteoporosis   . Alzheimer disease   . HLD (hyperlipidemia)   . Colon cancer ([place]) 1996         Past Surgical History  Procedure Laterality Date  . Colon surgery    . Hip pinning,cannulated Left 07/16/2015    Procedure: CANNULATED HIP PINNING /INTERNAL FIXATION LEFT HIP;  Surgeon: Carole Civil, MD;  Location: [facility];  Service: Orthopedics;  Laterality: Left;  . Hip pinning,cannulated Right 10/31/2015    Procedure: INTERNAL FIXATION RIGHT HIP;  Surgeon: Carole Civil, MD;  Location: [facility];  Service: Orthopedics;  Laterality: Right;         Allergies  Allergen Reactions  . Penicillins Swelling    Tongue swells   Medications been reviewed.  Essentially Ativan 0.5 mg every 4 hours when necessary.  Roxanol 5 mg sublingual every 4 hours when necessary   Review of Systems   Is essentially unattainable's please see history of present illness      Immunization History  Administered Date(s) Administered  . Influenza Split 10/23/2011  . Influenza,inj,Quad PF,36+ Mos 08/01/2013, 07/20/2014  . Pneumococcal Conjugate-13 08/16/2014  . Pneumococcal Polysaccharide-23 07/30/2012  . Tdap 07/07/2011  . Zoster 04/18/2010       Pertinent  Health Maintenance Due  Topic Date Due  . DEXA SCAN  08/23/1998  . INFLUENZA VACCINE  05/13/2016  . PNA vac Low Risk Adult  Completed   Fall Risk  03/08/2015 08/16/2014 12/21/2013 11/21/2013 08/10/2013  Falls in the past year? Yes No Yes Yes No  Number falls in past yr: 1 - 1 2 or more -  Injury with Fall? No - Yes - -  Risk for fall due to : - - - Impaired balance/gait -   Functional Status Survey:   Dentures 98.2 pulse 64 respirations 14 blood pressure 111/63 weight is 83.4  .  Physical Exam   In general this is a frail elderly female resting in bed comfortably there is no sign of distress   Her skin is warm and dry. Erythema of her cheeks appears to be somewhat improved from previous exam  Eyes pupils appear reactive to light sclera and conjunctiva are clear.  Oropharynx is clear she has poor dentition tongue is midline  Chest is clear to auscultation with poor respiratory effort she does not really follow verbal commands.  Heart is regular rate and rhythm without murmur gallop or rub. She does not  have significant lower extremity edema  Abdomen soft nontender positive bowel sounds.  Musculoskeletal is able to move all extremities 4 again with extreme frailty diffuse arthritic changes with muscle wasting   Neurologic difficult exam since patient does not really follow verbal commands but I do not note any lateralizing findings she is largely nonverbal cranial nerves appear to be grossly intact  Psych again she has end-stage dementia with appearss baseline again with failure to thrive issues which appears to have stabilized recently     Labs reviewed:  Recent Labs (within last 365 days)   Recent Labs  11/03/15 1130 11/05/15 0615 11/06/15 0700 11/12/15 0600  NA 142 145 144 141  K 3.7 4.3 3.6 4.0  CL 107 109 109 104  CO2 27 28 26 28   GLUCOSE 134* 142* 126* 103*  BUN 33* 40* 47* 22*  CREATININE 1.25* 1.13* 1.06* 0.90  CALCIUM 8.8* 9.2 8.4* 8.7*  MG 1.6*  --   --   --       Recent Labs (within last 365 days)   Recent Labs  03/08/15 1233 08/31/15 1610 10/30/15 0644  AST 21 19 15   ALT 10 11* 13*  ALKPHOS 79 85 69  BILITOT 0.8 0.7 0.9  PROT 6.2 5.5* 5.6*  ALBUMIN 3.7 2.7* 2.4*      Recent Labs (within last 365 days)   Recent Labs  08/31/15 1610 10/29/15 1618  11/02/15 0620 11/06/15 0700 11/12/15 0600  WBC 6.4 9.4  < > 7.5 10.5 8.3  NEUTROABS 4.6 8.0*  --   --  8.8*  --   HGB 10.6* 10.5*  < > 11.2* 11.1* 11.1*  HCT 32.4* 32.3*  < > 34.8* 34.7* 34.5*  MCV 94.7 89.0  < > 89.2 90.4 89.8  PLT 199 247  < > 197 243 393  < > = values in this interval not displayed.   Recent Labs  No results found for: TSH   Recent Labs       Lab Results  Component Value Date   HGBA1C 5.2% 01/09/2014     Recent Labs       Lab Results  Component Value Date   CHOL 126 03/08/2015   HDL 59 03/08/2015   LDLCALC 48 12/21/2013   TRIG 66 03/08/2015      Significant Diagnostic Results in last 30 days:  Imaging Results  No results found.      Assessment/Plan  1 history of failure to thrive with end-stage dementia- Continue supportive care-this appears to be relatively stable-at one point she had significant episodes of decreased responsiveness but this has not to my knowledge occurred for some time-she does continue with when necessary Ativan as needed for anxiety and when necessary Roxanol for pain but I don't believe she requires this much  #2 history of right hip fracture that was surgically repaired she does not appear to be in pain appears to have  tolerated this fairly well.  History of osteoporosis again she is on minimal medications with emphasis on comfort care.  #4 history of suspected rosacea she appears to be responding to the MetroGel at this point will monitor.   #5 history of right metacarpal fracture-again her edema and the pain appears to have resolved here at this point will monitor.  [id number]

## 2016-06-22 ENCOUNTER — Non-Acute Institutional Stay (SKILLED_NURSING_FACILITY): Payer: Medicare Other | Admitting: Internal Medicine

## 2016-06-22 DIAGNOSIS — F0280 Dementia in other diseases classified elsewhere without behavioral disturbance: Secondary | ICD-10-CM

## 2016-06-22 DIAGNOSIS — R21 Rash and other nonspecific skin eruption: Secondary | ICD-10-CM | POA: Diagnosis not present

## 2016-06-22 DIAGNOSIS — G309 Alzheimer's disease, unspecified: Secondary | ICD-10-CM | POA: Diagnosis not present

## 2016-06-22 DIAGNOSIS — R627 Adult failure to thrive: Secondary | ICD-10-CM

## 2016-06-22 NOTE — Progress Notes (Signed)
This is an acute visit.  Level of care skilled.  Facility [facility].  Chief complaint.  Acute visit secondary to assess buttocks wound issues.  History of present illness.  Patient is an 82 year old female seen today for development of a darkened skin area buttocks area.  This was discovered by the CNA today-apparently new-there is some discoloration bilaterally of her buttocks is somewhat brownish tinged does not really appear to be eschar--- there also appears to be some erythematous changes superior. To this and the perineum which appears to be more of a fungal presentation and erythematous.  This does not really appear to have signs of infection or acute tenderness.  Hdoes have somewhat of a brownish crusted appearance running  longitudinally both sides of the buttocks  Patient does have severe end-stage dementia and failure to thrive issues although these appear to be relatively stable now she is eating and drinking better is more responsive.      Past Medical History  Diagnosis Date  . Osteoporosis   . Alzheimer disease   . HLD (hyperlipidemia)   . Colon cancer ([place]) 1996         Past Surgical History  Procedure Laterality Date  . Colon surgery    . Hip pinning,cannulated Left 07/16/2015    Procedure: CANNULATED HIP PINNING /INTERNAL FIXATION LEFT HIP; Surgeon: Carole Civil, MD; Location: [facility]; Service: Orthopedics; Laterality: Left;  . Hip pinning,cannulated Right 10/31/2015    Procedure: INTERNAL FIXATION RIGHT HIP; Surgeon: Carole Civil, MD; Location: [facility]; Service: Orthopedics; Laterality: Right;         Allergies  Allergen Reactions  . Penicillins Swelling    Tongue swells   Medications been reviewed.  Essentially Ativan 0.5 mg every 4 hours when necessary.  Roxanol 5 mg sublingual every 4 hours when necessary   Review of Systems  Is essentially unattainable's please see history of present  illness      Immunization History  Administered Date(s) Administered  . Influenza Split 10/23/2011  . Influenza,inj,Quad PF,36+ Mos 08/01/2013, 07/20/2014  . Pneumococcal Conjugate-13 08/16/2014  . Pneumococcal Polysaccharide-23 07/30/2012  . Tdap 07/07/2011  . Zoster 04/18/2010       Pertinent Health Maintenance Due  Topic Date Due  . DEXA SCAN  08/23/1998  . INFLUENZA VACCINE  05/13/2016  . PNA vac Low Risk Adult  Completed   Fall Risk  03/08/2015 08/16/2014 12/21/2013 11/21/2013 08/10/2013  Falls in the past year? Yes No Yes Yes No  Number falls in past yr: 1 - 1 2 or more -  Injury with Fall? No - Yes - -  Risk for fall due to : - - - Impaired balance/gait -   Functional Status Survey:    Temperature 98.1 pulse 69 respirations 20 blood pressure 106/61--weight is 83.8 . Physical Exam  In general this is a frail elderly female resting in bed comfortably there is no sign of distress somewhat agitated with exam  Her skin is warm and dry she does again have the brownish discoloration changes to lateral buttocks area along with some erythema that appears to be more fungal superior to the area-I do not see any drainage bleeding or erythema that would be concerning for cellulitis   Eyes pupils appear reactive to light sclera and conjunctiva are clear.  Oropharynx is clear she has poor dentition tongue is midline  Chest is clear to auscultation with poor respiratory effort she does not really follow verbal commands.  Heart is regular rate  and rhythm without murmur gallop or rub. She does not have significant lower extremity edema  Abdomen soft nontender positive bowel sounds.  Musculoskeletal is able to move all extremities 4 again with extreme frailty diffuse arthritic changes with muscle wasting   Neurologic difficult exam since patient does not really follow verbal commands but I do not note any lateralizing findings she is largely nonverbal  cranial nerves appear to be grossly intact  Psych again she has end-stage dementia with appearss baseline again with failure to thrive issues which appears to have stabilized recently is somewhat resistant to exam     Labs reviewed:  Recent Labs (within last 365 days)   Recent Labs  11/03/15 1130 11/05/15 0615 11/06/15 0700 11/12/15 0600  NA 142 145 144 141  K 3.7 4.3 3.6 4.0  CL 107 109 109 104  CO2 27 28 26 28   GLUCOSE 134* 142* 126* 103*  BUN 33* 40* 47* 22*  CREATININE 1.25* 1.13* 1.06* 0.90  CALCIUM 8.8* 9.2 8.4* 8.7*  MG 1.6* --  --  --       Recent Labs (within last 365 days)   Recent Labs  03/08/15 1233 08/31/15 1610 10/30/15 0644  AST 21 19 15   ALT 10 11* 13*  ALKPHOS 79 85 69  BILITOT 0.8 0.7 0.9  PROT 6.2 5.5* 5.6*  ALBUMIN 3.7 2.7* 2.4*      Recent Labs (within last 365 days)   Recent Labs  08/31/15 1610 10/29/15 1618  11/02/15 0620 11/06/15 0700 11/12/15 0600  WBC 6.4 9.4 <> 7.5 10.5 8.3  NEUTROABS 4.6 8.0* --  --  8.8* --   HGB 10.6* 10.5* <> 11.2* 11.1* 11.1*  HCT 32.4* 32.3* <> 34.8* 34.7* 34.5*  MCV 94.7 89.0 <> 89.2 90.4 89.8  PLT 199 247 <> 197 243 393  < > = values in this interval not displayed.   Recent Labs  No results found for: TSH   Recent Labs       Lab Results  Component Value Date   HGBA1C 5.2% 01/09/2014     Recent Labs       Lab Results  Component Value Date   CHOL 126 03/08/2015   HDL 59 03/08/2015   LDLCALC 48 12/21/2013   TRIG 66 03/08/2015      Significant Diagnostic Results in last 30 days:  Imaging Results  No results found.    Assessment and plan.  #1 history of buttocks skin issues as noted above-this was discussed with nursing will keep this open to air and try to keep pressure off it-also this must be Clean and dry.  Nursing is aware of this-also will have wound care to follow-up as well as possibly the wound care medical provider  who does round here.  Continue to monitor for any progression or changes again it is important to keep this clean and dry.  #2 end-stage dementia-she appears to be able in this regard she does have orders for Roxanol and Ativan as needed but does not really require these at this time she appears to be at baseline It appears she does eat about 50-100 percent  of her meals often which is encouraging Weight is stable at 83.8  [id number]

## 2016-07-01 ENCOUNTER — Encounter: Payer: Self-pay | Admitting: Internal Medicine

## 2016-07-01 ENCOUNTER — Non-Acute Institutional Stay (SKILLED_NURSING_FACILITY): Payer: Medicare Other | Admitting: Internal Medicine

## 2016-07-01 DIAGNOSIS — F0280 Dementia in other diseases classified elsewhere without behavioral disturbance: Secondary | ICD-10-CM | POA: Diagnosis not present

## 2016-07-01 DIAGNOSIS — G309 Alzheimer's disease, unspecified: Secondary | ICD-10-CM | POA: Diagnosis not present

## 2016-07-01 DIAGNOSIS — M810 Age-related osteoporosis without current pathological fracture: Secondary | ICD-10-CM | POA: Diagnosis not present

## 2016-07-01 DIAGNOSIS — R21 Rash and other nonspecific skin eruption: Secondary | ICD-10-CM

## 2016-07-01 NOTE — Progress Notes (Addendum)
Location:   [facility] Room Number: 125/P Place of Service:  SNF (31) Provider:  Vara Guardian, FNP  Patient Care Team: Chevis Pretty, FNP as PCP - General (Nurse Practitioner)  Extended Emergency Contact Information Primary Emergency Contact: Dena Billet Address: [address]          [place], [place] [address] Johnnette Litter of [place] Phone: [phone] Mobile Phone: [phone] Relation: Spouse  Code Status:  DNR Goals of care: Advanced Directive information Advanced Directives 07/01/2016  Does patient have an advance directive? Yes  Type of Advance Directive Out of facility DNR (pink MOST or yellow form)  Does patient want to make changes to advanced directive? No - Patient declined  Copy of advanced directive(s) in chart? Yes  Would patient like information on creating an advanced directive? -  Pre-existing out of facility DNR order (yellow form or pink MOST form) -     No chief complaint on file.   HPI:  Pt is a 82 y.o. female seen today for an acute visit for worsening rash on her buttocks. Patient is comfort care secondary to end stage Alzheimer dementia and severe osteoporosis. Nurses have noticed worsening rash in her bottom not responding to Ketoconazole. Patient is mostly non verbal and has not been c/o any pain. Nurses also said she stays wet due to incontinence and they are avoiding diapers due to rash.    Past Medical History:  Diagnosis Date  . Alzheimer disease   . Colon cancer ([place]) 1996  . HLD (hyperlipidemia)   . Osteoporosis    Past Surgical History:  Procedure Laterality Date  . COLON SURGERY    . HIP PINNING,CANNULATED Left 07/16/2015   Procedure: CANNULATED HIP PINNING /INTERNAL FIXATION LEFT HIP;  Surgeon: Carole Civil, MD;  Location: [facility];  Service: Orthopedics;  Laterality: Left;  . HIP PINNING,CANNULATED Right 10/31/2015   Procedure: INTERNAL FIXATION RIGHT HIP;   Surgeon: Carole Civil, MD;  Location: [facility];  Service: Orthopedics;  Laterality: Right;    Allergies  Allergen Reactions  . Penicillins Swelling    Tongue swells (angioedema)    Current Outpatient Prescriptions on File Prior to Visit  Medication Sig Dispense Refill  . ENSURE PLUS (ENSURE PLUS) LIQD Give twice a day. Hold if lethargic    . LORazepam (ATIVAN) 2 MG/ML concentrated solution Take 0.5 mg by mouth every 4 (four) hours as needed for anxiety.    . metroNIDAZOLE (METROGEL) 1 % gel Apply 1 application topically 2 (two) times daily.     Marland Kitchen morphine (ROXANOL) 20 MG/ML concentrated solution Take 0.25 mLs (5 mg total) by mouth every 4 (four) hours as needed for severe pain. 30 mL 0   No current facility-administered medications on file prior to visit.     Review of Systems  Unable to perform ROS: Dementia    Immunization History  Administered Date(s) Administered  . Influenza Split 10/23/2011  . Influenza,inj,Quad PF,36+ Mos 08/01/2013, 07/20/2014  . Pneumococcal Conjugate-13 08/16/2014  . Pneumococcal Polysaccharide-23 07/30/2012  . Tdap 07/07/2011  . Zoster 04/18/2010   Pertinent  Health Maintenance Due  Topic Date Due  . INFLUENZA VACCINE  05/13/2016  . DEXA SCAN  03/04/2017 (Originally 08/23/1998)  . PNA vac Low Risk Adult  Completed   Fall Risk  03/08/2015 08/16/2014 12/21/2013 11/21/2013 08/10/2013  Falls in the past year? Yes No Yes Yes No  Number falls in past yr: 1 - 1 2 or more -  Injury  with Fall? No - Yes - -  Risk for fall due to : - - - Impaired balance/gait -   Functional Status Survey:    There were no vitals filed for this visit. There is no height or weight on file to calculate BMI. Physical Exam  Constitutional: She appears lethargic. She appears cachectic. She is easily aroused.  Cardiovascular: Normal rate and regular rhythm.   Pulmonary/Chest: Effort normal and breath sounds normal.  Neurological: She is easily aroused. She appears  lethargic.  Skin:  Severe redness and Excoriation of her bottom and vaginal area. Some of the skin near the perirectal area.is necrotic.    Labs reviewed:  Recent Labs  11/03/15 1130 11/05/15 0615 11/06/15 0700 11/12/15 0600  NA 142 145 144 141  K 3.7 4.3 3.6 4.0  CL 107 109 109 104  CO2 27 28 26 28   GLUCOSE 134* 142* 126* 103*  BUN 33* 40* 47* 22*  CREATININE 1.25* 1.13* 1.06* 0.90  CALCIUM 8.8* 9.2 8.4* 8.7*  MG 1.6*  --   --   --     Recent Labs  08/31/15 1610 10/30/15 0644  AST 19 15  ALT 11* 13*  ALKPHOS 85 69  BILITOT 0.7 0.9  PROT 5.5* 5.6*  ALBUMIN 2.7* 2.4*    Recent Labs  08/31/15 1610 10/29/15 1618  11/02/15 0620 11/06/15 0700 11/12/15 0600  WBC 6.4 9.4  < > 7.5 10.5 8.3  NEUTROABS 4.6 8.0*  --   --  8.8*  --   HGB 10.6* 10.5*  < > 11.2* 11.1* 11.1*  HCT 32.4* 32.3*  < > 34.8* 34.7* 34.5*  MCV 94.7 89.0  < > 89.2 90.4 89.8  PLT 199 247  < > 197 243 393  < > = values in this interval not displayed. No results found for: TSH Lab Results  Component Value Date   HGBA1C 5.2% 01/09/2014   Lab Results  Component Value Date   CHOL 126 03/08/2015   HDL 59 03/08/2015   LDLCALC 48 12/21/2013   TRIG 66 03/08/2015    Significant Diagnostic Results in last 30 days:  No results found.  Assessment/Plan  Rash of perineum  Most likely Fungal. Foley catheter for skin protection and patient comfort Start on PO Diflucan 100 Mg PO QD for 7 days Also start on Lotrisone to help with inflammation   Dementia in Alzheimer's disease Patient comfort care. Continue Roxanol  Osteoporosis Comfort care     Family/ staff Communication: D/W husband who was near the bed.  Labs/tests ordered:

## 2016-07-04 ENCOUNTER — Encounter: Payer: Self-pay | Admitting: Internal Medicine

## 2016-07-07 ENCOUNTER — Encounter: Payer: Self-pay | Admitting: Internal Medicine

## 2016-07-07 ENCOUNTER — Non-Acute Institutional Stay (SKILLED_NURSING_FACILITY): Payer: Medicare Other | Admitting: Internal Medicine

## 2016-07-07 DIAGNOSIS — B373 Candidiasis of vulva and vagina: Secondary | ICD-10-CM

## 2016-07-07 DIAGNOSIS — B3731 Acute candidiasis of vulva and vagina: Secondary | ICD-10-CM

## 2016-07-07 NOTE — Progress Notes (Signed)
Location:   [facility] Room Number: 125/P Place of Service:  SNF (31) Provider:  Vara Guardian, FNP  Patient Care Team: Chevis Pretty, FNP as PCP - General (Nurse Practitioner)  Extended Emergency Contact Information Primary Emergency Contact: Dena Billet Address: [address]          [place], [place] [address] Johnnette Litter of [place] Phone: [phone] Mobile Phone: [phone] Relation: Spouse  Code Status:  DNR Goals of care: Advanced Directive information Advanced Directives 07/07/2016  Does patient have an advance directive? Yes  Type of Advance Directive Out of facility DNR (pink MOST or yellow form)  Does patient want to make changes to advanced directive? No - Patient declined  Copy of advanced directive(s) in chart? Yes  Would patient like information on creating an advanced directive? -  Pre-existing out of facility DNR order (yellow form or pink MOST form) -     Chief Complaint  Patient presents with  . Acute Visit        HPI:  Pt is a 82 y.o. female seen today for an acute visit for follow up for rash. Patient has end stage Alzheimer dementia and severe osteoporosis who is on comfort care. She had rash in her perineum secondary to fungal. Diflucan and Foley has really improved the rash.    Past Medical History:  Diagnosis Date  . Alzheimer disease   . Colon cancer ([place]) 1996  . HLD (hyperlipidemia)   . Osteoporosis    Past Surgical History:  Procedure Laterality Date  . COLON SURGERY    . HIP PINNING,CANNULATED Left 07/16/2015   Procedure: CANNULATED HIP PINNING /INTERNAL FIXATION LEFT HIP;  Surgeon: Carole Civil, MD;  Location: [facility];  Service: Orthopedics;  Laterality: Left;  . HIP PINNING,CANNULATED Right 10/31/2015   Procedure: INTERNAL FIXATION RIGHT HIP;  Surgeon: Carole Civil, MD;  Location: [facility];  Service: Orthopedics;  Laterality: Right;    Allergies  Allergen  Reactions  . Penicillins Swelling    Tongue swells (angioedema)    Current Outpatient Prescriptions on File Prior to Visit  Medication Sig Dispense Refill  . Balsam [place]-Castor Oil (VENELEX) OINT Apply to sacrum and bilateral buttocks q shift and prn for prevention    . LORazepam (ATIVAN) 2 MG/ML concentrated solution Take 0.5 mg by mouth every 4 (four) hours as needed for anxiety.    Marland Kitchen morphine (ROXANOL) 20 MG/ML concentrated solution Take 0.25 mLs (5 mg total) by mouth every 4 (four) hours as needed for severe pain. 30 mL 0   No current facility-administered medications on file prior to visit.     Review of Systems  Unable to perform ROS: Dementia    Immunization History  Administered Date(s) Administered  . Influenza Split 10/23/2011  . Influenza,inj,Quad PF,36+ Mos 08/01/2013, 07/20/2014  . Pneumococcal Conjugate-13 08/16/2014  . Pneumococcal Polysaccharide-23 07/30/2012  . Tdap 07/07/2011  . Zoster 04/18/2010   Pertinent  Health Maintenance Due  Topic Date Due  . INFLUENZA VACCINE  09/12/2016 (Originally 05/13/2016)  . DEXA SCAN  03/04/2017 (Originally 08/23/1998)  . PNA vac Low Risk Adult  Completed   Fall Risk  03/08/2015 08/16/2014 12/21/2013 11/21/2013 08/10/2013  Falls in the past year? Yes No Yes Yes No  Number falls in past yr: 1 - 1 2 or more -  Injury with Fall? No - Yes - -  Risk for fall due to : - - - Impaired balance/gait -   Functional Status Survey:  Vitals:   07/07/16 1431  BP: (!) 107/50  Pulse: 62  Resp: 16  Temp: 98.4 F (36.9 C)  TempSrc: Oral   There is no height or weight on file to calculate BMI. Physical Exam  Constitutional: She appears cachectic.  Neurological: She is alert.  Skin:  Rash is much improved with necrosis almost resolved. But continue to have redness in the area.    Labs reviewed:  Recent Labs  11/03/15 1130 11/05/15 0615 11/06/15 0700 11/12/15 0600  NA 142 145 144 141  K 3.7 4.3 3.6 4.0  CL 107 109 109 104    CO2 27 28 26 28   GLUCOSE 134* 142* 126* 103*  BUN 33* 40* 47* 22*  CREATININE 1.25* 1.13* 1.06* 0.90  CALCIUM 8.8* 9.2 8.4* 8.7*  MG 1.6*  --   --   --     Recent Labs  08/31/15 1610 10/30/15 0644  AST 19 15  ALT 11* 13*  ALKPHOS 85 69  BILITOT 0.7 0.9  PROT 5.5* 5.6*  ALBUMIN 2.7* 2.4*    Recent Labs  08/31/15 1610 10/29/15 1618  11/02/15 0620 11/06/15 0700 11/12/15 0600  WBC 6.4 9.4  < > 7.5 10.5 8.3  NEUTROABS 4.6 8.0*  --   --  8.8*  --   HGB 10.6* 10.5*  < > 11.2* 11.1* 11.1*  HCT 32.4* 32.3*  < > 34.8* 34.7* 34.5*  MCV 94.7 89.0  < > 89.2 90.4 89.8  PLT 199 247  < > 197 243 393  < > = values in this interval not displayed. No results found for: TSH Lab Results  Component Value Date   HGBA1C 5.2% 01/09/2014   Lab Results  Component Value Date   CHOL 126 03/08/2015   HDL 59 03/08/2015   LDLCALC 48 12/21/2013   TRIG 66 03/08/2015    Significant Diagnostic Results in last 30 days:  No results found.  Assessment/Plan  Yeast infection of Perineum  Patient doing well on Diflucan Continue for another week. Continue Lotrisone.   Family/ staff Communication:   Labs/tests ordered:

## 2016-07-22 ENCOUNTER — Encounter (HOSPITAL_COMMUNITY)
Admission: RE | Admit: 2016-07-22 | Discharge: 2016-07-22 | Disposition: A | Discharge status: Home / Self Care | Payer: Medicare Other | Source: Skilled Nursing Facility | Attending: Internal Medicine | Admitting: Internal Medicine

## 2016-07-22 DIAGNOSIS — R627 Adult failure to thrive: Secondary | ICD-10-CM | POA: Insufficient documentation

## 2016-07-22 DIAGNOSIS — Z9181 History of falling: Secondary | ICD-10-CM | POA: Insufficient documentation

## 2016-07-22 DIAGNOSIS — M79641 Pain in right hand: Secondary | ICD-10-CM | POA: Insufficient documentation

## 2016-07-22 DIAGNOSIS — Z79899 Other long term (current) drug therapy: Secondary | ICD-10-CM | POA: Insufficient documentation

## 2016-07-22 DIAGNOSIS — Z4789 Encounter for other orthopedic aftercare: Secondary | ICD-10-CM | POA: Insufficient documentation

## 2016-07-22 DIAGNOSIS — E785 Hyperlipidemia, unspecified: Secondary | ICD-10-CM | POA: Insufficient documentation

## 2016-07-22 LAB — URINALYSIS, ROUTINE W REFLEX MICROSCOPIC
Bilirubin Urine: NEGATIVE
Glucose, UA: NEGATIVE mg/dL
Ketones, ur: NEGATIVE mg/dL
NITRITE: NEGATIVE
SPECIFIC GRAVITY, URINE: 1.02 (ref 1.005–1.030)
pH: 8 (ref 5.0–8.0)

## 2016-07-22 LAB — URINE MICROSCOPIC-ADD ON

## 2016-07-23 ENCOUNTER — Encounter (HOSPITAL_COMMUNITY)
Admission: RE | Admit: 2016-07-23 | Discharge: 2016-07-23 | Disposition: A | Discharge status: Home / Self Care | Payer: Medicare Other | Source: Skilled Nursing Facility | Attending: *Deleted | Admitting: *Deleted

## 2016-07-23 ENCOUNTER — Encounter: Payer: Self-pay | Admitting: Internal Medicine

## 2016-07-23 ENCOUNTER — Non-Acute Institutional Stay (SKILLED_NURSING_FACILITY): Payer: Medicare Other | Admitting: Internal Medicine

## 2016-07-23 DIAGNOSIS — G309 Alzheimer's disease, unspecified: Secondary | ICD-10-CM

## 2016-07-23 DIAGNOSIS — F0280 Dementia in other diseases classified elsewhere without behavioral disturbance: Secondary | ICD-10-CM

## 2016-07-23 DIAGNOSIS — E870 Hyperosmolality and hypernatremia: Secondary | ICD-10-CM | POA: Diagnosis not present

## 2016-07-23 DIAGNOSIS — R627 Adult failure to thrive: Secondary | ICD-10-CM | POA: Diagnosis not present

## 2016-07-23 LAB — CBC WITH DIFFERENTIAL/PLATELET
BASOS PCT: 0 %
Basophils Absolute: 0 10*3/uL (ref 0.0–0.1)
Eosinophils Absolute: 0 10*3/uL (ref 0.0–0.7)
Eosinophils Relative: 0 %
HEMATOCRIT: 43.3 % (ref 36.0–46.0)
HEMOGLOBIN: 14 g/dL (ref 12.0–15.0)
Lymphocytes Relative: 17 %
Lymphs Abs: 1.1 10*3/uL (ref 0.7–4.0)
MCH: 30.8 pg (ref 26.0–34.0)
MCHC: 32.3 g/dL (ref 30.0–36.0)
MCV: 95.2 fL (ref 78.0–100.0)
MONOS PCT: 9 %
Monocytes Absolute: 0.6 10*3/uL (ref 0.1–1.0)
NEUTROS ABS: 5 10*3/uL (ref 1.7–7.7)
NEUTROS PCT: 74 %
Platelets: 221 10*3/uL (ref 150–400)
RBC: 4.55 MIL/uL (ref 3.87–5.11)
RDW: 13.8 % (ref 11.5–15.5)
WBC: 6.8 10*3/uL (ref 4.0–10.5)

## 2016-07-23 LAB — COMPREHENSIVE METABOLIC PANEL
ALBUMIN: 2.7 g/dL — AB (ref 3.5–5.0)
ALK PHOS: 68 U/L (ref 38–126)
ALT: 22 U/L (ref 14–54)
ANION GAP: 6 (ref 5–15)
AST: 22 U/L (ref 15–41)
BILIRUBIN TOTAL: 0.8 mg/dL (ref 0.3–1.2)
BUN: 47 mg/dL — AB (ref 6–20)
CALCIUM: 9 mg/dL (ref 8.9–10.3)
CO2: 26 mmol/L (ref 22–32)
CREATININE: 1.04 mg/dL — AB (ref 0.44–1.00)
Chloride: 116 mmol/L — ABNORMAL HIGH (ref 101–111)
GFR calc Af Amer: 56 mL/min — ABNORMAL LOW (ref >60)
GFR calc non Af Amer: 49 mL/min — ABNORMAL LOW (ref >60)
GLUCOSE: 103 mg/dL — AB (ref 65–99)
Potassium: 3.7 mmol/L (ref 3.5–5.1)
SODIUM: 148 mmol/L — AB (ref 135–145)
TOTAL PROTEIN: 5.9 g/dL — AB (ref 6.5–8.1)

## 2016-07-23 NOTE — Progress Notes (Signed)
Location:   [facility] Room Number: 125/P Place of Service:  SNF [phone]) Provider:  Granville Lewis  Mary-Margaret Hassell Done, [place]  Patient Care Team: Chevis Pretty, FNP as PCP - General (Nurse Practitioner)  Extended Emergency Contact Information Primary Emergency Contact: Dena Billet Address: [address]          [place], [place] [address] Johnnette Litter of [place] Phone: [phone] Mobile Phone: [phone] Relation: Spouse  Code Status:  DNR Goals of care: Advanced Directive information Advanced Directives 07/23/2016  Does patient have an advance directive? Yes  Type of Advance Directive Out of facility DNR (pink MOST or yellow form)  Does patient want to make changes to advanced directive? No - Patient declined  Copy of advanced directive(s) in chart? Yes  Would patient like information on creating an advanced directive? -  Pre-existing out of facility DNR order (yellow form or pink MOST form) -     Chief Complaint  Patient presents with  . Acute Visit    F/U not eating or drinking  With mildly elevated sodium  HPI:  Pt is a 82 y.o. female seen today for an acute visit for poor by mouth intake.  At one point she had been on comfort care but actually did very well with supportive care she is no longer under comfort measures. She does have a history of severe dementia  She has been eating and drinking recently well but apparently the last few days this is diminished.  According nursing staff she did have a good bowel movement apparently late yesterday early today and she appears to be doing a bit better now.  Nonetheless she is not back to her baseline.  Her vital signs are stable.  Her labs again on most remarkable for sodium of 148 creatinine is 1.04 which is relatively her baseline BUN is mildly elevated at 47.  Liver function tests are unremarkable except for albumin of 2.7.  Her white count is not elevated hemoglobin is  stable to improved at 14.0 again this may reflect some renal issues.  Currently she is resting in her [place] chair comfortably she is alert and responsive although does not really speak which is baseline.  She does appear quite weak and frail   Past Medical History:  Diagnosis Date  . Alzheimer disease   . Colon cancer ([place]) 1996  . HLD (hyperlipidemia)   . Osteoporosis    Past Surgical History:  Procedure Laterality Date  . COLON SURGERY    . HIP PINNING,CANNULATED Left 07/16/2015   Procedure: CANNULATED HIP PINNING /INTERNAL FIXATION LEFT HIP;  Surgeon: Carole Civil, MD;  Location: [facility];  Service: Orthopedics;  Laterality: Left;  . HIP PINNING,CANNULATED Right 10/31/2015   Procedure: INTERNAL FIXATION RIGHT HIP;  Surgeon: Carole Civil, MD;  Location: [facility];  Service: Orthopedics;  Laterality: Right;    Allergies  Allergen Reactions  . Penicillins Swelling    Tongue swells (angioedema)    Current Outpatient Prescriptions on File Prior to Visit  Medication Sig Dispense Refill  . Balsam [place]-Castor Oil (VENELEX) OINT Apply to sacrum and bilateral buttocks q shift and prn for prevention    . clotrimazole-betamethasone (LOTRISONE) cream Apply 1 application topically 2 (two) times daily.    . feeding supplement, ENSURE ENLIVE, (ENSURE ENLIVE) LIQD Take 237 mLs by mouth 2 (two) times daily between meals.    Marland Kitchen LORazepam (ATIVAN) 2 MG/ML concentrated solution Take 0.5 mg by mouth every 4 (four) hours as  needed for anxiety.    Marland Kitchen morphine (ROXANOL) 20 MG/ML concentrated solution Take 0.25 mLs (5 mg total) by mouth every 4 (four) hours as needed for severe pain. 30 mL 0   No current facility-administered medications on file prior to visit.      Review of Systems  Please see history of present illness-very limited secondary to dementia she does not appear to be in pain or uncomfortable again she did have a recent bowel movement which has apparently helped  somewhat  Immunization History  Administered Date(s) Administered  . Influenza Split 10/23/2011  . Influenza,inj,Quad PF,36+ Mos 08/01/2013, 07/20/2014  . Influenza-Unspecified 07/17/2016  . Pneumococcal Conjugate-13 08/16/2014  . Pneumococcal Polysaccharide-23 07/30/2012  . Tdap 07/07/2011  . Zoster 04/18/2010   Pertinent  Health Maintenance Due  Topic Date Due  . DEXA SCAN  03/04/2017 (Originally 08/23/1998)  . INFLUENZA VACCINE  Completed  . PNA vac Low Risk Adult  Completed   Fall Risk  03/08/2015 08/16/2014 12/21/2013 11/21/2013 08/10/2013  Falls in the past year? Yes No Yes Yes No  Number falls in past yr: 1 - 1 2 or more -  Injury with Fall? No - Yes - -  Risk for fall due to : - - - Impaired balance/gait -   Functional Status Survey:    Vitals:   07/23/16 1031  BP: 133/73  Pulse: 69  Resp: 20  Temp: 97.1 F (36.2 C)  TempSrc: Oral   There is no height or weight on file to calculate BMI. Physical Exam   In general this is a very frail elderly female in no distress.  Her skin is warm and dry.  Eyes pupils appear reactive she does not really open her eyes on command sclera and conjunctivae are clear.  Oropharynx limited exam since patient did not open her mouth wide mucous membranes appear slightly dry it appears from what I can tell.  Chest poor respiratory effort but I cannot appreciate any congestion or labored breathing.  Heart is regular rate and rhythm without murmur gallop or rub does not have any significant lower extremity edema.  Her abdomen is soft nontender there are positive bowel sounds.  Muscle skeletal has diffuse arthritic changes --holds upper arms in a semi-contracted position also has generalized stiffness of her lower extremities  Neurologic patient does not really follow simple verbal commands but she appears at her baseline she does respond to tactile stimulation does verbalize somewhat incoherently which is her baseline  Psych again  findings compatible with severe dementia   Labs reviewed:  Recent Labs  11/03/15 1130  11/06/15 0700 11/12/15 0600 07/23/16 0500  NA 142  < > 144 141 148*  K 3.7  < > 3.6 4.0 3.7  CL 107  < > 109 104 116*  CO2 27  < > 26 28 26   GLUCOSE 134*  < > 126* 103* 103*  BUN 33*  < > 47* 22* 47*  CREATININE 1.25*  < > 1.06* 0.90 1.04*  CALCIUM 8.8*  < > 8.4* 8.7* 9.0  MG 1.6*  --   --   --   --   < > = values in this interval not displayed.  Recent Labs  08/31/15 1610 10/30/15 0644 07/23/16 0500  AST 19 15 22   ALT 11* 13* 22  ALKPHOS 85 69 68  BILITOT 0.7 0.9 0.8  PROT 5.5* 5.6* 5.9*  ALBUMIN 2.7* 2.4* 2.7*    Recent Labs  10/29/15 1618  11/06/15 0700 11/12/15  0600 07/23/16 0500  WBC 9.4  < > 10.5 8.3 6.8  NEUTROABS 8.0*  --  8.8*  --  5.0  HGB 10.5*  < > 11.1* 11.1* 14.0  HCT 32.3*  < > 34.7* 34.5* 43.3  MCV 89.0  < > 90.4 89.8 95.2  PLT 247  < > 243 393 221  < > = values in this interval not displayed. No results found for: TSH Lab Results  Component Value Date   HGBA1C 5.2% 01/09/2014   Lab Results  Component Value Date   CHOL 126 03/08/2015   HDL 59 03/08/2015   LDLCALC 48 12/21/2013   TRIG 66 03/08/2015    Significant Diagnostic Results in last 30 days:  No results found.  Assessment/Plan #1 severe dementia complicated with decreased by mouth intake failure to thrive-labs show relative stability except for the sodium 148 and somewhat increased BUN of 47 which is really somewhat compatible with labs done earlier this year-at this point will encourage by mouth fluid strongly-also will order a dietary consult for possible additional supplements-she also would benefit from restorative dining.  She will need updated labs first laboratory data week-this plan was discussed with Dr. Lyndel Safe.  [place], [place], [place]

## 2016-07-25 ENCOUNTER — Non-Acute Institutional Stay (SKILLED_NURSING_FACILITY): Payer: Medicare Other | Admitting: Internal Medicine

## 2016-07-25 ENCOUNTER — Encounter (HOSPITAL_COMMUNITY): Payer: Self-pay | Admitting: Emergency Medicine

## 2016-07-25 ENCOUNTER — Emergency Department (HOSPITAL_COMMUNITY): Payer: Medicare Other

## 2016-07-25 ENCOUNTER — Inpatient Hospital Stay (HOSPITAL_COMMUNITY)
Admission: EM | Admit: 2016-07-25 | Discharge: 2016-07-28 | DRG: 871 | Disposition: A | Discharge status: Hospice | Payer: Medicare Other | Attending: Internal Medicine | Admitting: Internal Medicine

## 2016-07-25 ENCOUNTER — Encounter: Payer: Self-pay | Admitting: Internal Medicine

## 2016-07-25 DIAGNOSIS — F0280 Dementia in other diseases classified elsewhere without behavioral disturbance: Secondary | ICD-10-CM | POA: Diagnosis present

## 2016-07-25 DIAGNOSIS — N179 Acute kidney failure, unspecified: Secondary | ICD-10-CM | POA: Diagnosis present

## 2016-07-25 DIAGNOSIS — Z87891 Personal history of nicotine dependence: Secondary | ICD-10-CM

## 2016-07-25 DIAGNOSIS — G9340 Encephalopathy, unspecified: Secondary | ICD-10-CM | POA: Diagnosis present

## 2016-07-25 DIAGNOSIS — Z66 Do not resuscitate: Secondary | ICD-10-CM | POA: Diagnosis present

## 2016-07-25 DIAGNOSIS — Z681 Body mass index (BMI) 19 or less, adult: Secondary | ICD-10-CM

## 2016-07-25 DIAGNOSIS — R627 Adult failure to thrive: Secondary | ICD-10-CM | POA: Diagnosis not present

## 2016-07-25 DIAGNOSIS — E785 Hyperlipidemia, unspecified: Secondary | ICD-10-CM | POA: Diagnosis present

## 2016-07-25 DIAGNOSIS — R000 Tachycardia, unspecified: Secondary | ICD-10-CM | POA: Diagnosis present

## 2016-07-25 DIAGNOSIS — N390 Urinary tract infection, site not specified: Secondary | ICD-10-CM | POA: Diagnosis present

## 2016-07-25 DIAGNOSIS — Z880 Allergy status to penicillin: Secondary | ICD-10-CM

## 2016-07-25 DIAGNOSIS — G309 Alzheimer's disease, unspecified: Secondary | ICD-10-CM | POA: Diagnosis present

## 2016-07-25 DIAGNOSIS — M810 Age-related osteoporosis without current pathological fracture: Secondary | ICD-10-CM | POA: Diagnosis present

## 2016-07-25 DIAGNOSIS — E872 Acidosis, unspecified: Secondary | ICD-10-CM | POA: Diagnosis present

## 2016-07-25 DIAGNOSIS — A419 Sepsis, unspecified organism: Principal | ICD-10-CM | POA: Diagnosis present

## 2016-07-25 DIAGNOSIS — L89152 Pressure ulcer of sacral region, stage 2: Secondary | ICD-10-CM | POA: Diagnosis not present

## 2016-07-25 DIAGNOSIS — R0902 Hypoxemia: Secondary | ICD-10-CM | POA: Diagnosis present

## 2016-07-25 DIAGNOSIS — E860 Dehydration: Secondary | ICD-10-CM | POA: Diagnosis present

## 2016-07-25 DIAGNOSIS — R4182 Altered mental status, unspecified: Secondary | ICD-10-CM | POA: Diagnosis not present

## 2016-07-25 DIAGNOSIS — R21 Rash and other nonspecific skin eruption: Secondary | ICD-10-CM

## 2016-07-25 DIAGNOSIS — Z515 Encounter for palliative care: Secondary | ICD-10-CM | POA: Diagnosis present

## 2016-07-25 DIAGNOSIS — Z7401 Bed confinement status: Secondary | ICD-10-CM

## 2016-07-25 DIAGNOSIS — E870 Hyperosmolality and hypernatremia: Secondary | ICD-10-CM | POA: Diagnosis present

## 2016-07-25 DIAGNOSIS — E876 Hypokalemia: Secondary | ICD-10-CM | POA: Diagnosis present

## 2016-07-25 DIAGNOSIS — Z8249 Family history of ischemic heart disease and other diseases of the circulatory system: Secondary | ICD-10-CM

## 2016-07-25 DIAGNOSIS — E43 Unspecified severe protein-calorie malnutrition: Secondary | ICD-10-CM | POA: Diagnosis present

## 2016-07-25 DIAGNOSIS — Z85038 Personal history of other malignant neoplasm of large intestine: Secondary | ICD-10-CM

## 2016-07-25 LAB — URINE MICROSCOPIC-ADD ON: SQUAMOUS EPITHELIAL / LPF: NONE SEEN

## 2016-07-25 LAB — URINALYSIS, ROUTINE W REFLEX MICROSCOPIC
Bilirubin Urine: NEGATIVE
Glucose, UA: NEGATIVE mg/dL
Ketones, ur: NEGATIVE mg/dL
NITRITE: NEGATIVE
PH: 7 (ref 5.0–8.0)
Protein, ur: 100 mg/dL — AB
SPECIFIC GRAVITY, URINE: 1.02 (ref 1.005–1.030)

## 2016-07-25 LAB — CBC WITH DIFFERENTIAL/PLATELET
BASOS ABS: 0 10*3/uL (ref 0.0–0.1)
Basophils Relative: 0 %
Eosinophils Absolute: 0 10*3/uL (ref 0.0–0.7)
Eosinophils Relative: 0 %
HEMATOCRIT: 46.7 % — AB (ref 36.0–46.0)
Hemoglobin: 15.2 g/dL — ABNORMAL HIGH (ref 12.0–15.0)
LYMPHS ABS: 0.5 10*3/uL — AB (ref 0.7–4.0)
LYMPHS PCT: 4 %
MCH: 31.1 pg (ref 26.0–34.0)
MCHC: 32.5 g/dL (ref 30.0–36.0)
MCV: 95.7 fL (ref 78.0–100.0)
MONOS PCT: 4 %
Monocytes Absolute: 0.5 10*3/uL (ref 0.1–1.0)
NEUTROS PCT: 92 %
Neutro Abs: 12.3 10*3/uL — ABNORMAL HIGH (ref 1.7–7.7)
Platelets: 200 10*3/uL (ref 150–400)
RBC: 4.88 MIL/uL (ref 3.87–5.11)
RDW: 14 % (ref 11.5–15.5)
WBC Morphology: INCREASED
WBC: 13.3 10*3/uL — AB (ref 4.0–10.5)

## 2016-07-25 LAB — COMPREHENSIVE METABOLIC PANEL
ALT: 24 U/L (ref 14–54)
ANION GAP: 8 (ref 5–15)
AST: 32 U/L (ref 15–41)
Albumin: 3 g/dL — ABNORMAL LOW (ref 3.5–5.0)
Alkaline Phosphatase: 72 U/L (ref 38–126)
BILIRUBIN TOTAL: 1.1 mg/dL (ref 0.3–1.2)
BUN: 49 mg/dL — AB (ref 6–20)
CO2: 27 mmol/L (ref 22–32)
Calcium: 9.5 mg/dL (ref 8.9–10.3)
Chloride: 117 mmol/L — ABNORMAL HIGH (ref 101–111)
Creatinine, Ser: 1.41 mg/dL — ABNORMAL HIGH (ref 0.44–1.00)
GFR, EST AFRICAN AMERICAN: 39 mL/min — AB (ref >60)
GFR, EST NON AFRICAN AMERICAN: 34 mL/min — AB (ref >60)
Glucose, Bld: 128 mg/dL — ABNORMAL HIGH (ref 65–99)
POTASSIUM: 3.9 mmol/L (ref 3.5–5.1)
Sodium: 152 mmol/L — ABNORMAL HIGH (ref 135–145)
TOTAL PROTEIN: 6.9 g/dL (ref 6.5–8.1)

## 2016-07-25 LAB — I-STAT CG4 LACTIC ACID, ED
LACTIC ACID, VENOUS: 3.74 mmol/L — AB (ref 0.5–1.9)
Lactic Acid, Venous: 1.83 mmol/L (ref 0.5–1.9)

## 2016-07-25 MED ORDER — VANCOMYCIN HCL 500 MG IV SOLR
500.0000 mg | Freq: Once | INTRAVENOUS | Status: AC
  Administered 2016-07-25: 500 mg via INTRAVENOUS
  Filled 2016-07-25: qty 500

## 2016-07-25 MED ORDER — SODIUM CHLORIDE 0.9 % IV BOLUS (SEPSIS)
1000.0000 mL | Freq: Once | INTRAVENOUS | Status: AC
  Administered 2016-07-25: 1000 mL via INTRAVENOUS

## 2016-07-25 MED ORDER — ACETAMINOPHEN 325 MG PO TABS: 650.0000 mg | ORAL_TABLET | Freq: Four times a day (QID) | ORAL | Status: DC | PRN

## 2016-07-25 MED ORDER — SODIUM CHLORIDE 0.9 % IV SOLN
INTRAVENOUS | Status: AC
  Administered 2016-07-25: 19:00:00 via INTRAVENOUS

## 2016-07-25 MED ORDER — ACETAMINOPHEN 650 MG RE SUPP
650.0000 mg | Freq: Once | RECTAL | Status: AC
  Administered 2016-07-25: 650 mg via RECTAL

## 2016-07-25 MED ORDER — NYSTATIN 100000 UNIT/GM EX CREA
TOPICAL_CREAM | CUTANEOUS | Status: AC
  Filled 2016-07-25: qty 15

## 2016-07-25 MED ORDER — DEXTROSE 5 % IV SOLN
INTRAVENOUS | Status: AC
  Filled 2016-07-25: qty 1

## 2016-07-25 MED ORDER — ACETAMINOPHEN 650 MG RE SUPP
RECTAL | Status: AC
  Filled 2016-07-25: qty 1

## 2016-07-25 MED ORDER — DEXTROSE 5 % IV SOLN
500.0000 mg | Freq: Three times a day (TID) | INTRAVENOUS | Status: DC
  Administered 2016-07-25 – 2016-07-28 (×8): 500 mg via INTRAVENOUS
  Filled 2016-07-25 (×13): qty 0.5

## 2016-07-25 MED ORDER — ONDANSETRON HCL 4 MG/2ML IJ SOLN: 4.0000 mg | Freq: Four times a day (QID) | INTRAMUSCULAR | Status: DC | PRN

## 2016-07-25 MED ORDER — ACETAMINOPHEN 650 MG RE SUPP: 650.0000 mg | Freq: Four times a day (QID) | RECTAL | Status: DC | PRN

## 2016-07-25 MED ORDER — SODIUM CHLORIDE 0.9 % IV BOLUS (SEPSIS)
250.0000 mL | Freq: Once | INTRAVENOUS | Status: AC
  Administered 2016-07-25: 250 mL via INTRAVENOUS

## 2016-07-25 MED ORDER — NYSTATIN 100000 UNIT/GM EX CREA
TOPICAL_CREAM | Freq: Two times a day (BID) | CUTANEOUS | Status: DC
  Administered 2016-07-25 – 2016-07-28 (×6): via TOPICAL
  Filled 2016-07-25: qty 15

## 2016-07-25 MED ORDER — LORAZEPAM 2 MG/ML PO CONC
0.5000 mg | ORAL | Status: DC | PRN
  Filled 2016-07-25: qty 0.25

## 2016-07-25 MED ORDER — DEXTROSE 5 % IV SOLN
1.0000 g | Freq: Once | INTRAVENOUS | Status: AC
  Administered 2016-07-25: 1 g via INTRAVENOUS
  Filled 2016-07-25: qty 1

## 2016-07-25 MED ORDER — ONDANSETRON HCL 4 MG PO TABS: 4.0000 mg | ORAL_TABLET | Freq: Four times a day (QID) | ORAL | Status: DC | PRN

## 2016-07-25 MED ORDER — ENSURE ENLIVE PO LIQD: 237.0000 mL | Freq: Two times a day (BID) | ORAL | Status: DC

## 2016-07-25 MED ORDER — ENOXAPARIN SODIUM 30 MG/0.3ML ~~LOC~~ SOLN
30.0000 mg | SUBCUTANEOUS | Status: DC
  Administered 2016-07-25 – 2016-07-27 (×3): 30 mg via SUBCUTANEOUS
  Filled 2016-07-25 (×3): qty 0.3

## 2016-07-25 MED ORDER — MORPHINE SULFATE (CONCENTRATE) 10 MG/0.5ML PO SOLN: 5.0000 mg | ORAL | Status: DC | PRN

## 2016-07-25 NOTE — Progress Notes (Signed)
Location:   [facility] Room Number: 125/P Place of Service:  SNF (31) Provider:  Granville Lewis  Mary-Margaret Hassell Done, [place]  Patient Care Team: Chevis Pretty, FNP as PCP - General (Nurse Practitioner)  Extended Emergency Contact Information Primary Emergency Contact: Dena Billet Address: [address]          [place], [place] [address] Johnnette Litter of [place] Phone: [phone] Mobile Phone: [phone] Relation: Spouse  Code Status:  DNR Goals of care: Advanced Directive information Advanced Directives 07/25/2016  Does patient have an advance directive? Yes  Type of Advance Directive Out of facility DNR (pink MOST or yellow form)  Does patient want to make changes to advanced directive? No - Patient declined  Copy of advanced directive(s) in chart? Yes  Would patient like information on creating an advanced directive? -  Pre-existing out of facility DNR order (yellow form or pink MOST form) -     Chief Complaint  Patient presents with  . Acute Visit    Patient still not eating or drinking much   With tachycardia mild hypoxia and somewhat low blood pressure  HPI:  Pt is a 82 y.o. female seen today for an acute visit for above issues. She does have a history of severe dementia and at one point had significant failure to thrive issues and had been on some comfort care measures-- but then perked up some eating and drinking better and was more alert-  She was seen earlier this week for poor by mouth intake-labs were fairly unremarkable except for sodium that appear to be elevated at 148-patient was considered a poor candidate for IV and staff has been encouraging fluids.  She also had a large bowel movement approximately a day ago and apparently did a little better yesterday ate and drank a bit although not a huge amount.  However this morning she has taken a turn it appears in the other direction she is responsive and alert but her  blood pressure is somewhat low at 97/61 pulse at times is elevated above 100 she is afebrile she had mild hypoxia with O2 stats in the high 90s she is now on oxygen and O2 stat is in the 90s.  She does not appear to be in any distress at appears to be very weak and frail and again is not really eating or drinking anything this morning.  At one point she had been on essentially comfort care but her husband now desires more aggressive interventions I did speak with him on the phone this morning and he does want her sent to the ER for evaluation.  Of note she also did have a urine culture done and results have shown greater than 100,000 colonies gram-negative rods with no sensitivities available at this time.  It appears she has been afebrile.  She cannot give any review of systems secondary to severe dementia   Past Medical History:  Diagnosis Date  . Alzheimer disease   . Colon cancer ([place]) 1996  . HLD (hyperlipidemia)   . Osteoporosis    Past Surgical History:  Procedure Laterality Date  . COLON SURGERY    . HIP PINNING,CANNULATED Left 07/16/2015   Procedure: CANNULATED HIP PINNING /INTERNAL FIXATION LEFT HIP;  Surgeon: Carole Civil, MD;  Location: [facility];  Service: Orthopedics;  Laterality: Left;  . HIP PINNING,CANNULATED Right 10/31/2015   Procedure: INTERNAL FIXATION RIGHT HIP;  Surgeon: Carole Civil, MD;  Location: [facility];  Service: Orthopedics;  Laterality:  Right;    Allergies  Allergen Reactions  . Penicillins Swelling    Tongue swells (angioedema)    Current Outpatient Prescriptions on File Prior to Visit  Medication Sig Dispense Refill  . Balsam [place]-Castor Oil (VENELEX) OINT Apply to sacrum and bilateral buttocks q shift and prn for prevention    . clotrimazole-betamethasone (LOTRISONE) cream Apply 1 application topically 2 (two) times daily.    . feeding supplement, ENSURE ENLIVE, (ENSURE ENLIVE) LIQD Take 237 mLs by mouth 2 (two) times daily between meals.     Marland Kitchen LORazepam (ATIVAN) 2 MG/ML concentrated solution Take 0.5 mg by mouth every 4 (four) hours as needed for anxiety.    Marland Kitchen morphine (ROXANOL) 20 MG/ML concentrated solution Take 0.25 mLs (5 mg total) by mouth every 4 (four) hours as needed for severe pain. 30 mL 0   No current facility-administered medications on file prior to visit.      Review of Systems Essentially unattainable secondary to dementia please see history of present illness Immunization History  Administered Date(s) Administered  . Influenza Split 10/23/2011  . Influenza,inj,Quad PF,36+ Mos 08/01/2013, 07/20/2014  . Influenza-Unspecified 07/17/2016  . Pneumococcal Conjugate-13 08/16/2014  . Pneumococcal Polysaccharide-23 07/30/2012  . Tdap 07/07/2011  . Zoster 04/18/2010   Pertinent  Health Maintenance Due  Topic Date Due  . DEXA SCAN  03/04/2017 (Originally 08/23/1998)  . INFLUENZA VACCINE  Completed  . PNA vac Low Risk Adult  Completed   Fall Risk  03/08/2015 08/16/2014 12/21/2013 11/21/2013 08/10/2013  Falls in the past year? Yes No Yes Yes No  Number falls in past yr: 1 - 1 2 or more -  Injury with Fall? No - Yes - -  Risk for fall due to : - - - Impaired balance/gait -   Functional Status Survey:    Temperature 98.2 pulse listed is 116 I got 104 on auscultation-respirations 23 blood pressure 97/61 O2 saturation was 89% before oxygen was applied rose and the 90s with oxygen  Physical Exam  In general this is a very frail elderly female in no acute distress lying in bed.  Her skin is warm and dry.  Eyes pupils appear reactive she does not really open her eyes on command   Oropharynx limited exam since patient did not open her mouth wide  But from what I was able to see her mucous membranes appear dry.  Chest poor respiratory effort -no labored breathing shallow air entry.  Heart is regular rate and rhythm borderline tachycardic at times without murmur gallop or rub does not have any significant  lower extremity edema.  Her abdomen is soft nontender there are positive bowel sounds.  Muscle skeletal has diffuse arthritic changes --holds upper arms in a semi-contracted position also has generalized stiffness of her lower extremities She does move her extremities with touch stimuli  Neurologic patient does not really follow simple verbal commands  she does respond to tactile stimulation does verbalize somewhat incoherently which is her baseline  Psych again findings compatible with severe dementia   Labs reviewed:  Recent Labs  11/03/15 1130  11/06/15 0700 11/12/15 0600 07/23/16 0500  NA 142  < > 144 141 148*  K 3.7  < > 3.6 4.0 3.7  CL 107  < > 109 104 116*  CO2 27  < > 26 28 26   GLUCOSE 134*  < > 126* 103* 103*  BUN 33*  < > 47* 22* 47*  CREATININE 1.25*  < > 1.06* 0.90 1.04*  CALCIUM 8.8*  < > 8.4* 8.7* 9.0  MG 1.6*  --   --   --   --   < > = values in this interval not displayed.  Recent Labs  08/31/15 1610 10/30/15 0644 07/23/16 0500  AST 19 15 22   ALT 11* 13* 22  ALKPHOS 85 69 68  BILITOT 0.7 0.9 0.8  PROT 5.5* 5.6* 5.9*  ALBUMIN 2.7* 2.4* 2.7*    Recent Labs  10/29/15 1618  11/06/15 0700 11/12/15 0600 07/23/16 0500  WBC 9.4  < > 10.5 8.3 6.8  NEUTROABS 8.0*  --  8.8*  --  5.0  HGB 10.5*  < > 11.1* 11.1* 14.0  HCT 32.3*  < > 34.7* 34.5* 43.3  MCV 89.0  < > 90.4 89.8 95.2  PLT 247  < > 243 393 221  < > = values in this interval not displayed. No results found for: TSH Lab Results  Component Value Date   HGBA1C 5.2% 01/09/2014   Lab Results  Component Value Date   CHOL 126 03/08/2015   HDL 59 03/08/2015   LDLCALC 48 12/21/2013   TRIG 66 03/08/2015    Significant Diagnostic Results in last 30 days:  No results found.  Assessment/Plan 1 failure to thrive with borderline tachycardia hypotension mild hypoxia-and very poor by mouth intake-again this was discussed with her husband via phone and he would like her evaluated in the  hospital-we'll send her to the ER for expedient evaluation.  [place], [place], [place]

## 2016-07-25 NOTE — Progress Notes (Signed)
Pharmacy Antibiotic Note  Kathryn Hamilton is a 82 y.o. female admitted on 07/25/2016 with UTI r/o sepsis.  Pharmacy has been consulted for aztreonam dosing. Aztreonam 1 gm given earlier today  Plan: Cont aztreonam 500 mg IV q8 hours F/u renal function, cultures and clinical course  Height: 5\' 6"  (167.6 cm) Weight: 80 lb (36.3 kg) IBW/kg (Calculated) : 59.3  Temp (24hrs), Avg:99.3 F (37.4 C), Min:98.4 F (36.9 C), Max:100.2 F (37.9 C)   Recent Labs Lab 07/23/16 0500 07/25/16 1224 07/25/16 1307 07/25/16 1519  WBC 6.8 13.3*  --   --   CREATININE 1.04* 1.41*  --   --   LATICACIDVEN  --   --  3.74* 1.83    Estimated Creatinine Clearance: 17.6 mL/min (by C-G formula based on SCr of 1.41 mg/dL (H)).    Allergies  Allergen Reactions  . Penicillins Swelling    Tongue swells (angioedema)    Antimicrobials this admission: aztreonam 10/13 >>  10/13 vanc x 1 dose    Thank you for allowing pharmacy to be a part of this patient's care.  Beverlee Nims 07/25/2016 6:38 PM

## 2016-07-25 NOTE — ED Provider Notes (Signed)
[facility] DEPT Provider Note   CSN: [id number] Arrival date & time: 07/25/16  1127  By signing my name below, I, Jeanell Sparrow, attest that this documentation has been prepared under the direction and in the presence of Daleen Bo, MD . Electronically Signed: Jeanell Sparrow, Scribe. 07/25/2016. 12:14 PM. History   Chief Complaint Chief Complaint  Patient presents with  . Failure To Thrive    The history is provided by medical records. No language interpreter was used.   LEVEL 5 CAVEAT due to AMS  HPI Comments: Kathryn Hamilton is a 82 y.o. female with a hx of failure to thrive who presents to the Emergency Department. Per [facility] note, pt was send here by Dr. Lyndel Safe for tacycardia, hypotension, and mild hypoxia. Per nurse's note, pt has a DNR order.   Past Medical History:  Diagnosis Date  . Alzheimer disease   . Colon cancer ([place]) 1996  . HLD (hyperlipidemia)   . Osteoporosis     Patient Active Problem List   Diagnosis Date Noted  . Rash of perineum 03/29/2016  . Right wrist fracture 03/05/2016  . Right wrist pain 03/04/2016  . FTT (failure to thrive) in adult 11/06/2015  . Hypokalemia 11/04/2015  . Femoral neck fracture, right, closed, initial encounter 10/31/2015  . Closed displaced fracture of right femoral neck ([place])   . Urinary tract infection, site not specified 10/30/2015  . Nonspecific abnormal electrocardiogram (ECG) (EKG) 10/30/2015  . Hematuria 10/29/2015  . DNR (do not resuscitate) 10/29/2015  . Urethral caruncle 09/05/2015  . Left displaced femoral neck fracture ([place]) 07/16/2015  . Fall   . Hyperlipidemia with target LDL less than 100 01/31/2013  . Dementia in Alzheimer's disease 01/31/2013  . Osteoporosis 01/31/2013    Past Surgical History:  Procedure Laterality Date  . COLON SURGERY    . HIP PINNING,CANNULATED Left 07/16/2015   Procedure: CANNULATED HIP PINNING /INTERNAL FIXATION LEFT HIP;  Surgeon: Carole Civil,  MD;  Location: [facility];  Service: Orthopedics;  Laterality: Left;  . HIP PINNING,CANNULATED Right 10/31/2015   Procedure: INTERNAL FIXATION RIGHT HIP;  Surgeon: Carole Civil, MD;  Location: [facility];  Service: Orthopedics;  Laterality: Right;    OB History    No data available       Home Medications    Prior to Admission medications   Medication Sig Start Date End Date Taking? Authorizing Provider  feeding supplement, ENSURE ENLIVE, (ENSURE ENLIVE) LIQD Take 237 mLs by mouth 2 (two) times daily between meals.   Yes Historical Provider, MD  Sodium Phosphates (FLEET ENEMA RE) Place 1 Dose rectally once.   Yes Historical Provider, MD  LORazepam (ATIVAN) 2 MG/ML concentrated solution Take 0.5 mg by mouth every 4 (four) hours as needed for anxiety.    Historical Provider, MD  morphine (ROXANOL) 20 MG/ML concentrated solution Take 0.25 mLs (5 mg total) by mouth every 4 (four) hours as needed for severe pain. 12/13/15   Gayland Curry, DO    Family History Family History  Problem Relation Age of Onset  . Heart disease Father     Social History Social History  Substance Use Topics  . Smoking status: Former Smoker    Quit date: 01/31/1973  . Smokeless tobacco: Never Used  . Alcohol use No     Allergies   Penicillins   Review of Systems Review of Systems  Unable to perform ROS: Mental status change   Physical Exam Updated Vital Signs BP (!) 117/53 (  BP Location: Left Arm)   Pulse 102   Temp 100.2 F (37.9 C) (Rectal)   Resp 24   Ht 5\' 6"  (1.676 m)   Wt 80 lb (36.3 kg)   SpO2 96%   BMI 12.91 kg/m   Physical Exam  Constitutional: She appears well-developed and well-nourished.  Pt is unresponsive.   HENT:  Head: Normocephalic.  Eyes: Conjunctivae and EOM are normal.  Neck: Normal range of motion and phonation normal. Neck supple.  Cardiovascular: Normal rate and regular rhythm.   Tachycardic and hypotensive.   Pulmonary/Chest: Effort normal and breath sounds  normal. She exhibits no tenderness.  Abdominal: Soft. She exhibits no distension. There is no tenderness. There is no guarding.  Genitourinary:  Genitourinary Comments: Erythema of the buttock and perineum. Fecal soy in the perineum.   Musculoskeletal: Normal range of motion.  Neurological: She exhibits normal muscle tone.  Skin: Skin is warm and dry.  Psychiatric: She has a normal mood and affect.  Nursing note and vitals reviewed.    ED Treatments / Results  DIAGNOSTIC STUDIES: Oxygen Saturation is 96% on RA, normal by my interpretation.    COORDINATION OF CARE: 12:18 PM- Pt advised of plan for treatment and pt agrees.  Labs (all labs ordered are listed, but only abnormal results are displayed) Labs Reviewed  URINALYSIS, ROUTINE W REFLEX MICROSCOPIC (NOT AT [facility]) - Abnormal; Notable for the following:       Result Value   APPearance HAZY (*)    Hgb urine dipstick LARGE (*)    Protein, ur 100 (*)    Leukocytes, UA SMALL (*)    All other components within normal limits  URINE MICROSCOPIC-ADD ON - Abnormal; Notable for the following:    Bacteria, UA MANY (*)    All other components within normal limits  COMPREHENSIVE METABOLIC PANEL - Abnormal; Notable for the following:    Sodium 152 (*)    Chloride 117 (*)    Glucose, Bld 128 (*)    BUN 49 (*)    Creatinine, Ser 1.41 (*)    Albumin 3.0 (*)    GFR calc non Af Amer 34 (*)    GFR calc Af Amer 39 (*)    All other components within normal limits  CBC WITH DIFFERENTIAL/PLATELET - Abnormal; Notable for the following:    WBC 13.3 (*)    Hemoglobin 15.2 (*)    HCT 46.7 (*)    Neutro Abs 12.3 (*)    Lymphs Abs 0.5 (*)    All other components within normal limits  I-STAT CG4 LACTIC ACID, ED - Abnormal; Notable for the following:    Lactic Acid, Venous 3.74 (*)    All other components within normal limits  CULTURE, BLOOD (ROUTINE X 2)  CULTURE, BLOOD (ROUTINE X 2)  URINE CULTURE  I-STAT CG4 LACTIC ACID, ED    EKG  EKG  Interpretation  Date/Time:  Friday July 25 2016 11:40:58 EDT Ventricular Rate:  119 PR Interval:    QRS Duration: 136 QT Interval:  387 QTC Calculation: 545 R Axis:   -164 Text Interpretation:  Sinus tachycardia Lateral infarct, age indeterminate Anterior infarct, age indeterminat Prolonged QT interval No significant change since last tracing Confirmed by ZACKOWSKI  MD, SCOTT [phone]) on 07/25/2016 12:07:42 PM       Radiology Dg Chest Port 1 View  Result Date: 07/25/2016 CLINICAL DATA:  Failure thrive. EXAM: PORTABLE CHEST 1 VIEW COMPARISON:  10/29/2015 FINDINGS: Mild cardiomegaly. There is hyperinflation of the  lungs compatible with COPD. No confluent opacities or effusions. No acute bony abnormality. IMPRESSION: Cardiomegaly, COPD.  No active disease. Electronically Signed   By: Rolm Baptise M.D.   On: 07/25/2016 12:37    Procedures Procedures (including critical care time)  Medications Ordered in ED Medications  sodium chloride 0.9 % bolus 1,000 mL (0 mLs Intravenous Stopped 07/25/16 1434)    And  sodium chloride 0.9 % bolus 250 mL (0 mLs Intravenous Stopped 07/25/16 1315)  aztreonam (AZACTAM) 1 g in dextrose 5 % 50 mL IVPB (0 g Intravenous Stopped 07/25/16 1346)  vancomycin (VANCOCIN) 500 mg in sodium chloride 0.9 % 100 mL IVPB (0 mg Intravenous Stopped 07/25/16 1451)  acetaminophen (TYLENOL) suppository 650 mg (650 mg Rectal Given 07/25/16 1322)     Initial Impression / Assessment and Plan / ED Course  I have reviewed the triage vital signs and the nursing notes.  Pertinent labs & imaging results that were available during my care of the patient were reviewed by me and considered in my medical decision making (see chart for details).  Clinical Course  Comment By Time  Patient's husband is here now and is very concerned that she is "infected", because of the redness of her perineum. He states that she appears to be much weaker than usual. She also has a caregiver who  is here currently, and agrees with these statements. They continued to assert that she is a comfort care patient. Daleen Bo, MD 10/13 1628    Medications  sodium chloride 0.9 % bolus 1,000 mL (0 mLs Intravenous Stopped 07/25/16 1434)    And  sodium chloride 0.9 % bolus 250 mL (0 mLs Intravenous Stopped 07/25/16 1315)  aztreonam (AZACTAM) 1 g in dextrose 5 % 50 mL IVPB (0 g Intravenous Stopped 07/25/16 1346)  vancomycin (VANCOCIN) 500 mg in sodium chloride 0.9 % 100 mL IVPB (0 mg Intravenous Stopped 07/25/16 1451)  acetaminophen (TYLENOL) suppository 650 mg (650 mg Rectal Given 07/25/16 1322)    Patient Vitals for the past 24 hrs:  BP Temp Temp src Pulse Resp SpO2 Height Weight  07/25/16 1500 (!) 121/46 - - 91 19 100 % - -  07/25/16 1437 - 98.4 F (36.9 C) Rectal - - - - -  07/25/16 1430 120/69 - - 90 17 100 % - -  07/25/16 1330 123/66 - - 99 17 97 % - -  07/25/16 1315 - - - 110 19 99 % - -  07/25/16 1300 - - - 101 21 100 % - -  07/25/16 1245 136/89 - - 108 21 97 % - -  07/25/16 1131 (!) 117/53 100.2 F (37.9 C) Rectal 102 24 96 % 5\' 6"  (1.676 m) 80 lb (36.3 kg)    4:31 PM Reevaluation with update and discussion. After initial assessment and treatment, an updated evaluation reveals No change in clinical status. Findings discussed with family members. Jackelin Correia L   4:33 PM-Consult complete with Hospitalist. Patient case explained and discussed. He agrees to admit patient for further evaluation and treatment. Call ended at 1640  Final Clinical Impressions(s) / ED Diagnoses   Final diagnoses:  Hypernatremia  Urinary tract infection without hematuria, site unspecified  Perineal rash in female   Debilitated patient, in a skilled nursing facility, with presentation for hypotension and tachycardia. Initial lactate elevated. With IV fluids. Patient has urinary tract infection, as likely source for sepsis. She will require admission for stabilization and treatment of hypernatremia.  She is "Comfort Care", DO NOT  RESUSCITATE.  Nursing Notes Reviewed/ Care Coordinated Applicable Imaging Reviewed Interpretation of Laboratory Data incorporated into ED treatment    New Prescriptions New Prescriptions   No medications on file   I personally performed the services described in this documentation, which was scribed in my presence. The recorded information has been reviewed and is accurate.     Daleen Bo, MD 07/25/16 2208

## 2016-07-25 NOTE — H&P (Addendum)
History and Physical  Kathryn Hamilton [id number] DOB: 1931-05-19 DOA: 07/25/2016  Referring physician: Dr Eulis Foster, ED physician PCP: Chevis Pretty, FNP  Outpatient Specialists:   Chief Complaint: Failure to thrive, altered mental status.  HPI: Kathryn Hamilton is a 82 y.o. female with a history of Alzheimer's disease, colon cancer, osteoporosis. Patient status post internal fixation of left hip in January 2017. She is a resident of the [facility] secondary to Alzheimer's dementia. Over the past 2 days, the patient's had a steady decline in eating, drinking fluid, and swallowing. She's also had diminished interaction with family. Due to patient's mental status, the patient's husband provides the history. She normally eats it is somewhat interactive. Due to the decline, the patient's husband was called to inform him. She was brought to the emergency department for evaluation.   Review of Systems:  Unable to obtain   Past Medical History:  Diagnosis Date  . Alzheimer disease   . Colon cancer ([place]) 1996  . HLD (hyperlipidemia)   . Osteoporosis    Past Surgical History:  Procedure Laterality Date  . COLON SURGERY    . HIP PINNING,CANNULATED Left 07/16/2015   Procedure: CANNULATED HIP PINNING /INTERNAL FIXATION LEFT HIP;  Surgeon: Carole Civil, MD;  Location: [facility];  Service: Orthopedics;  Laterality: Left;  . HIP PINNING,CANNULATED Right 10/31/2015   Procedure: INTERNAL FIXATION RIGHT HIP;  Surgeon: Carole Civil, MD;  Location: [facility];  Service: Orthopedics;  Laterality: Right;   Social History:  reports that she quit smoking about 43 years ago. She has never used smokeless tobacco. She reports that she does not drink alcohol or use drugs. Patient lives at [facility]. She is bedridden and unable to feed herself. She does eat and drink with minimal difficulty.  Allergies  Allergen Reactions  . Penicillins Swelling    Tongue swells (angioedema)     Family History  Problem Relation Age of Onset  . Heart disease Father       Prior to Admission medications   Medication Sig Start Date End Date Taking? Authorizing Provider  feeding supplement, ENSURE ENLIVE, (ENSURE ENLIVE) LIQD Take 237 mLs by mouth 2 (two) times daily between meals.   Yes Historical Provider, MD  Sodium Phosphates (FLEET ENEMA RE) Place 1 Dose rectally once.   Yes Historical Provider, MD  LORazepam (ATIVAN) 2 MG/ML concentrated solution Take 0.5 mg by mouth every 4 (four) hours as needed for anxiety.    Historical Provider, MD  morphine (ROXANOL) 20 MG/ML concentrated solution Take 0.25 mLs (5 mg total) by mouth every 4 (four) hours as needed for severe pain. 12/13/15   Gayland Curry, DO    Physical Exam: BP (!) 108/33   Pulse 78   Temp 98.4 F (36.9 C) (Rectal)   Resp 22   Ht 5\' 6"  (1.676 m)   Wt 36.3 kg (80 lb)   SpO2 91%   BMI 12.91 kg/m   General: Elderly frail appearing Caucasian female. No acute cardiopulmonary distress.  HEENT: Normocephalic atraumatic.  Right and left ears normal in appearance.  Pupils equal, round, reactive to light. Extraocular muscles are intact. Sclerae anicteric and noninjected.  Moist mucosal membranes. No mucosal lesions.  Neck: Neck supple without lymphadenopathy. No carotid bruits. No masses palpated.  Cardiovascular: Regular rate with normal S1-S2 sounds. No murmurs, rubs, gallops auscultated. No JVD.  Respiratory: Good respiratory effort with no wheezes, rales, rhonchi. Lungs clear to auscultation bilaterally.  No accessory muscle use.  Abdomen: Soft, nontender, nondistended. Active bowel sounds. No masses or hepatosplenomegaly  Skin:  Erythema to buttocks and perineum. Dry, warm to touch. 2+ dorsalis pedis and radial pulses. Musculoskeletal: No calf or leg pain. All major joints not erythematous nontender.  No upper or lower joint deformation.  Good ROM.  No contractures  Psychiatric: Intact judgment and insight. Pleasant  and cooperative. Neurologic: No focal neurological deficits. Strength is 5/5 and symmetric in upper and lower extremities.  Cranial nerves II through XII are grossly intact.           Labs on Admission: I have personally reviewed following labs and imaging studies  CBC:  Recent Labs Lab 07/23/16 0500 07/25/16 1224  WBC 6.8 13.3*  NEUTROABS 5.0 12.3*  HGB 14.0 15.2*  HCT 43.3 46.7*  MCV 95.2 95.7  PLT 221 [id number]   Basic Metabolic Panel:  Recent Labs Lab 07/23/16 0500 07/25/16 1224  NA 148* 152*  K 3.7 3.9  CL 116* 117*  CO2 26 27  GLUCOSE 103* 128*  BUN 47* 49*  CREATININE 1.04* 1.41*  CALCIUM 9.0 9.5   GFR: Estimated Creatinine Clearance: 17.6 mL/min (by C-G formula based on SCr of 1.41 mg/dL (H)). Liver Function Tests:  Recent Labs Lab 07/23/16 0500 07/25/16 1224  AST 22 32  ALT 22 24  ALKPHOS 68 72  BILITOT 0.8 1.1  PROT 5.9* 6.9  ALBUMIN 2.7* 3.0*   No results for input(s): LIPASE, AMYLASE in the last 168 hours. No results for input(s): AMMONIA in the last 168 hours. Coagulation Profile: No results for input(s): INR, PROTIME in the last 168 hours. Cardiac Enzymes: No results for input(s): CKTOTAL, CKMB, CKMBINDEX, TROPONINI in the last 168 hours. BNP (last 3 results) No results for input(s): PROBNP in the last 8760 hours. HbA1C: No results for input(s): HGBA1C in the last 72 hours. CBG: No results for input(s): GLUCAP in the last 168 hours. Lipid Profile: No results for input(s): CHOL, HDL, LDLCALC, TRIG, CHOLHDL, LDLDIRECT in the last 72 hours. Thyroid Function Tests: No results for input(s): TSH, T4TOTAL, FREET4, T3FREE, THYROIDAB in the last 72 hours. Anemia Panel: No results for input(s): VITAMINB12, FOLATE, FERRITIN, TIBC, IRON, RETICCTPCT in the last 72 hours. Urine analysis:    Component Value Date/Time   COLORURINE YELLOW 07/25/2016 1142   APPEARANCEUR HAZY (A) 07/25/2016 1142   LABSPEC 1.020 07/25/2016 1142   PHURINE 7.0 07/25/2016  1142   GLUCOSEU NEGATIVE 07/25/2016 1142   HGBUR LARGE (A) 07/25/2016 1142   BILIRUBINUR NEGATIVE 07/25/2016 1142   KETONESUR NEGATIVE 07/25/2016 1142   PROTEINUR 100 (A) 07/25/2016 1142   UROBILINOGEN 0.2 08/18/2015 0955   NITRITE NEGATIVE 07/25/2016 1142   LEUKOCYTESUR SMALL (A) 07/25/2016 1142   Sepsis Labs: @LABRCNTIP (procalcitonin:4,lacticidven:4) ) Recent Results (from the past 240 hour(s))  Culture, Urine     Status: Abnormal (Preliminary result)   Collection Time: 07/22/16  6:20 PM  Result Value Ref Range Status   Specimen Description URINE, CLEAN CATCH  Final   Special Requests NONE  Final   Culture >=100,000 COLONIES/mL PROTEUS MIRABILIS (A)  Final   Report Status PENDING  Incomplete  Blood Culture (routine x 2)     Status: None (Preliminary result)   Collection Time: 07/25/16 12:24 PM  Result Value Ref Range Status   Specimen Description BLOOD LEFT ARM  Final   Special Requests NONE [facility] EACH  Final   Culture PENDING  Incomplete   Report Status PENDING  Incomplete  Blood Culture (routine x 2)  Status: None (Preliminary result)   Collection Time: 07/25/16 12:35 PM  Result Value Ref Range Status   Specimen Description BLOOD RIGHT ARM  Final   Special Requests NONE 8 CC EACH  Final   Culture PENDING  Incomplete   Report Status PENDING  Incomplete     Radiological Exams on Admission: Dg Chest Port 1 View  Result Date: 07/25/2016 CLINICAL DATA:  Failure thrive. EXAM: PORTABLE CHEST 1 VIEW COMPARISON:  10/29/2015 FINDINGS: Mild cardiomegaly. There is hyperinflation of the lungs compatible with COPD. No confluent opacities or effusions. No acute bony abnormality. IMPRESSION: Cardiomegaly, COPD.  No active disease. Electronically Signed   By: Rolm Baptise M.D.   On: 07/25/2016 12:37    EKG: Independently reviewed. Sinus tachycardia with age-indeterminate inferior and anterior MI. No acute ST changes.  Assessment/Plan: Active Problems:   Urinary tract infection    FTT (failure to thrive) in adult   Sepsis ([place])   Encephalopathy   Sacral decubitus ulcer, stage II    This patient was discussed with the ED physician, including pertinent vitals, physical exam findings, labs, and imaging.  We also discussed care given by the ED provider.  #1 sepsis - early  Admit to [facility]  Patient fluid resuscitated  Source of sepsis appears to be UTI  Continue antibiotics  Continue cultures #2 encephalopathy  In the setting of Alzheimer's   We'll treat primary problem and look for resolution of encephalopathy #3 urine tract infection  Urine cultures started  Continue aztreonam  Hold vancomycin at this time #4 failure to thrive in adult  Consult nutrition  Had a long discussion with the patient's husband regarding palliative care and hospice. Patient's husband would like hospice consult - we'll order. Continue treating current medical problems as above. #5 sacral decubitus ulcer, stage II  Consults wound care  Some of this appears to be yeast vulvovaginitis. Will treat with nystatin cream for now. #6 lactic acidosis  Cleared  DVT prophylaxis: Lovenox Consultants: Palliative care Code Status: DO NOT RESUSCITATE Family Communication: Husband and caregiver in the room  Disposition Plan: Pending   Truett Mainland, DO [facility] Pager [phone]  If 7PM-7AM, please contact night-coverage [email or web address] Password [email or web address]

## 2016-07-25 NOTE — ED Notes (Signed)
Pt has large area excoriated area around perineum and buttocks, groin, sacrum.  Barrier cream applied and new diaper put on pt.

## 2016-07-25 NOTE — ED Notes (Signed)
Perineal area cleaned and left open to air. Nad. Pt laying on right side at present. Area is raw and gauded.

## 2016-07-25 NOTE — ED Triage Notes (Signed)
Pt from [facility], pt is tachycardic, hypertensive, unable to swallow, non-verbal, and has a DNR order.  Pt is in comfort care at this time, yet family wants IV intervention (fluids, antibiotics, etc). Pt looking around room at this time.

## 2016-07-25 NOTE — ED Notes (Signed)
Lab in obtained blood and blood cx.

## 2016-07-26 DIAGNOSIS — R4182 Altered mental status, unspecified: Secondary | ICD-10-CM | POA: Diagnosis present

## 2016-07-26 DIAGNOSIS — Z66 Do not resuscitate: Secondary | ICD-10-CM | POA: Diagnosis present

## 2016-07-26 DIAGNOSIS — R627 Adult failure to thrive: Secondary | ICD-10-CM

## 2016-07-26 DIAGNOSIS — M810 Age-related osteoporosis without current pathological fracture: Secondary | ICD-10-CM | POA: Diagnosis present

## 2016-07-26 DIAGNOSIS — E43 Unspecified severe protein-calorie malnutrition: Secondary | ICD-10-CM | POA: Diagnosis present

## 2016-07-26 DIAGNOSIS — Z7401 Bed confinement status: Secondary | ICD-10-CM | POA: Diagnosis not present

## 2016-07-26 DIAGNOSIS — L89152 Pressure ulcer of sacral region, stage 2: Secondary | ICD-10-CM

## 2016-07-26 DIAGNOSIS — E872 Acidosis: Secondary | ICD-10-CM

## 2016-07-26 DIAGNOSIS — A419 Sepsis, unspecified organism: Secondary | ICD-10-CM | POA: Diagnosis present

## 2016-07-26 DIAGNOSIS — R000 Tachycardia, unspecified: Secondary | ICD-10-CM | POA: Diagnosis present

## 2016-07-26 DIAGNOSIS — N179 Acute kidney failure, unspecified: Secondary | ICD-10-CM | POA: Diagnosis present

## 2016-07-26 DIAGNOSIS — Z681 Body mass index (BMI) 19 or less, adult: Secondary | ICD-10-CM | POA: Diagnosis not present

## 2016-07-26 DIAGNOSIS — Z515 Encounter for palliative care: Secondary | ICD-10-CM | POA: Diagnosis present

## 2016-07-26 DIAGNOSIS — E860 Dehydration: Secondary | ICD-10-CM | POA: Diagnosis present

## 2016-07-26 DIAGNOSIS — G309 Alzheimer's disease, unspecified: Secondary | ICD-10-CM | POA: Diagnosis present

## 2016-07-26 DIAGNOSIS — Z8249 Family history of ischemic heart disease and other diseases of the circulatory system: Secondary | ICD-10-CM | POA: Diagnosis not present

## 2016-07-26 DIAGNOSIS — E785 Hyperlipidemia, unspecified: Secondary | ICD-10-CM | POA: Diagnosis present

## 2016-07-26 DIAGNOSIS — F0280 Dementia in other diseases classified elsewhere without behavioral disturbance: Secondary | ICD-10-CM | POA: Diagnosis present

## 2016-07-26 DIAGNOSIS — N3000 Acute cystitis without hematuria: Secondary | ICD-10-CM

## 2016-07-26 DIAGNOSIS — Z87891 Personal history of nicotine dependence: Secondary | ICD-10-CM | POA: Diagnosis not present

## 2016-07-26 DIAGNOSIS — N390 Urinary tract infection, site not specified: Secondary | ICD-10-CM | POA: Diagnosis present

## 2016-07-26 DIAGNOSIS — G9340 Encephalopathy, unspecified: Secondary | ICD-10-CM | POA: Diagnosis present

## 2016-07-26 DIAGNOSIS — R0902 Hypoxemia: Secondary | ICD-10-CM | POA: Diagnosis present

## 2016-07-26 DIAGNOSIS — E870 Hyperosmolality and hypernatremia: Secondary | ICD-10-CM | POA: Diagnosis present

## 2016-07-26 DIAGNOSIS — E876 Hypokalemia: Secondary | ICD-10-CM | POA: Diagnosis present

## 2016-07-26 DIAGNOSIS — Z85038 Personal history of other malignant neoplasm of large intestine: Secondary | ICD-10-CM | POA: Diagnosis not present

## 2016-07-26 LAB — BASIC METABOLIC PANEL
ANION GAP: 3 — AB (ref 5–15)
BUN: 41 mg/dL — AB (ref 6–20)
CALCIUM: 8.4 mg/dL — AB (ref 8.9–10.3)
CO2: 23 mmol/L (ref 22–32)
Chloride: 125 mmol/L — ABNORMAL HIGH (ref 101–111)
Creatinine, Ser: 0.97 mg/dL (ref 0.44–1.00)
GFR calc Af Amer: >60 mL/min (ref >60)
GFR, EST NON AFRICAN AMERICAN: 53 mL/min — AB (ref >60)
GLUCOSE: 99 mg/dL (ref 65–99)
Potassium: 3.1 mmol/L — ABNORMAL LOW (ref 3.5–5.1)
Sodium: 151 mmol/L — ABNORMAL HIGH (ref 135–145)

## 2016-07-26 LAB — CBC
HCT: 39.3 % (ref 36.0–46.0)
Hemoglobin: 12.3 g/dL (ref 12.0–15.0)
MCH: 30 pg (ref 26.0–34.0)
MCHC: 31.3 g/dL (ref 30.0–36.0)
MCV: 95.9 fL (ref 78.0–100.0)
Platelets: 146 10*3/uL — ABNORMAL LOW (ref 150–400)
RBC: 4.1 MIL/uL (ref 3.87–5.11)
RDW: 14.1 % (ref 11.5–15.5)
WBC: 7.1 10*3/uL (ref 4.0–10.5)

## 2016-07-26 LAB — URINE CULTURE

## 2016-07-26 MED ORDER — LORAZEPAM 0.5 MG PO TABS: 0.5000 mg | ORAL_TABLET | ORAL | Status: DC | PRN

## 2016-07-26 MED ORDER — POTASSIUM CHLORIDE 2 MEQ/ML IV SOLN: INTRAVENOUS | Status: DC

## 2016-07-26 MED ORDER — LORAZEPAM 2 MG/ML IJ SOLN
0.5000 mg | INTRAMUSCULAR | Status: DC | PRN
Start: 2016-07-26 — End: 2016-07-28

## 2016-07-26 MED ORDER — ADULT MULTIVITAMIN W/MINERALS CH
1.0000 | ORAL_TABLET | Freq: Every day | ORAL | Status: DC
  Filled 2016-07-26: qty 1

## 2016-07-26 MED ORDER — POTASSIUM CHLORIDE 2 MEQ/ML IV SOLN
INTRAVENOUS | Status: DC
  Administered 2016-07-26 – 2016-07-27 (×2): via INTRAVENOUS
  Filled 2016-07-26 (×4): qty 1000

## 2016-07-26 NOTE — Progress Notes (Signed)
Initial Nutrition Assessment  DOCUMENTATION CODES:  Underweight, Severe malnutrition in context of acute illness/injury   Pt meets criteria for SEVERE MALNUTRITION in the context of Acute exacerbation of a Chronic disease evidenced by an intake of < or equal to 50% of needs for > or equal to 5 days and a loss of <5% bw in 1 month.  INTERVENTION:  Downgrade diet to reflect what pt was receiving at SNF: Puree with NTL.   If, pt willing to eat/drink, Mighty Shake BID TID with meals, each supplement provides 480-500 kcals and 20-23 grams of protein- this is NTL consistency and does not need thickened.   MVI with minerals.   NUTRITION DIAGNOSIS:  Inadequate oral intake related to lethargy/confusion, chronic illness (Advanced dementia) as evidenced by loss of >5% bw in 1 month  GOAL:  Patient will meet greater than or equal to 90% of their needs  MONITOR:  PO intake, Supplement acceptance, Labs, Skin, Goals of Care  REASON FOR ASSESSMENT:  Consult Wound healing  ASSESSMENT:  82 y/o Female PMHx advanced Alzheimers, dysphagia, osteoporosis, HLD, Multiple femur fxs. Resident of SNF Due to overall decline, was made Comfort Care in late Feb, however in the past few days POA asked for removal of Comfort Care and to have everything done.  RD operating remotely, but pt is very well known to RD. She has been resident at [facility] for [facility].   Due to her advanced dementia, pt's level of alertness would vary and often she would be too lethargic to eat or safely feed. She typically was fed by caregiver, but also by staff. She has severe dysphagia. Her diet order was Puree w/ NTL.   She was still able to maintain her weight at 80-85 lbs for the last 8 months or so. However, this past month, pt declined. She lost 7.5 lbs from beginning of September to early October. The past week in particular she has had very minimal intake of fluids/food.   At SNF, patient was receiving Ensure Enlive  BID, though intake also varied with level of alertness  Labs: K: 3.1, Na: 151,  Medications: Ensure BID, IV abx,    Recent Labs Lab 07/23/16 0500 07/25/16 1224 07/26/16 0659  NA 148* 152* 151*  K 3.7 3.9 3.1*  CL 116* 117* 125*  CO2 26 27 23   BUN 47* 49* 41*  CREATININE 1.04* 1.41* 0.97  CALCIUM 9.0 9.5 8.4*  GLUCOSE 103* 128* 99   Diet Order:  Diet Heart Room service appropriate? Yes; Fluid consistency: Thin  Skin: Pale, Pink, MSAD to Buttocks, Groin, Labia, Perineum, Thigh. Sacral Ulcer stage 2  Last BM:  Unknown  Height:  Ht Readings from Last 1 Encounters:  07/25/16 5\' 5"  (1.651 m)  Height from SNF is actually 4\' 10"    Weight:  Wt Readings from Last 1 Encounters:  07/25/16 77 lb 6.4 oz (35.1 kg)   Wt Readings from Last 10 Encounters:  07/25/16 77 lb 6.4 oz (35.1 kg)  04/29/16 82 lb 3.2 oz (37.3 kg)  03/05/16 80 lb 12.8 oz (36.7 kg)  03/04/16 80 lb 12.8 oz (36.7 kg)  01/22/16 82 lb 9.6 oz (37.5 kg)  10/31/15 85 lb (38.6 kg)  10/11/15 95 lb (43.1 kg)  09/05/15 87 lb (39.5 kg)  08/20/15 95 lb (43.1 kg)  07/16/15 95 lb (43.1 kg)   Ideal Body Weight:  56.82 kg  BMI:  Body mass index is 16.2 kg/m.  Estimated Nutritional Needs:  Kcal:  1250-1400 kcals (35-40 kcal/kg bw) Protein:  52-63 (1.5-1.8 g/kg bw) Fluid:  >1.2 Liters Fluid (35 ml/kg)  EDUCATION NEEDS:  No education needs identified at this time  Burtis Junes RD, LDN, [place] Clinical Nutrition Pager: [phone] 07/26/2016 8:43 AM

## 2016-07-26 NOTE — Progress Notes (Signed)
PROGRESS NOTE  Kathryn Hamilton  [id number] DOB: [birth year] DOA: 07/25/2016 PCP: Chevis Pretty, FNP Outpatient Specialists:  Subjective: Patient awake to verbal stimuli but does not follow commands  Brief Narrative:  Kathryn Hamilton is a 82 y.o. female with a history of Alzheimer's disease, colon cancer, osteoporosis. Patient status post internal fixation of left hip in January 2017. She is a resident of the [facility] secondary to Alzheimer's dementia. Over the past 2 days, the patient's had a steady decline in eating, drinking fluid, and swallowing. She's also had diminished interaction with family. Due to patient's mental status, the patient's husband provides the history. She normally eats it is somewhat interactive. Due to the decline, the patient's husband was called to inform him. She was brought to the emergency department for evaluation.   Assessment & Plan:   Active Problems:   Urinary tract infection   FTT (failure to thrive) in adult   Sepsis (HCC)   Encephalopathy   Sacral decubitus ulcer, stage II   Lactic acidosis   Protein-calorie malnutrition, severe   Sepsis - early Presented with WBC of 13.3, heart rate of 102, lactic acid of 3.7 and UTI Started on aggressive IV fluid hydration Source of sepsis appears to be UTI Allergic to penicillin started on aztreonam, recent cultures showed Proteus susceptible to ceftriaxone. We will try Ceftin while she is in the hospital and she is able to take oral meds.  Hypernatremia Sodium of 151, likely secondary to dehydration and poor oral intake. Patient started on IV fluid hydration, check BMP in a.m.  Hypokalemia Potassium of 3.1, replete, check BMP in a.m.  Acute kidney injury Presented with creatinine of 1.4, BUN of 49, baseline creatinine is normal. This is likely secondary to dehydration and poor oral intake.  Encephalopathy In the setting of Alzheimer's  We'll treat primary problem and look for  resolution of encephalopathy.  Urine tract infection Urine cultures started Continue aztreonam Hold vancomycin at this time  Failure to thrive in adult with severe protein energy malnutrition Failure to thrive with severe protein energy malnutrition, continue protein shakes. Had a long discussion with the patient's husband regarding palliative care and hospice. Patient's husband would like hospice consult - we'll order. Continue treating current medical problems as above.  Sacral decubitus ulcer, stage II Consults wound care Some of this appears to be yeast vulvovaginitis. Will treat with nystatin cream for now.  Lactic acidosis Cleared   DVT prophylaxis: Lovenox Code Status: DNR Family Communication:  Disposition Plan:  Diet: Diet Heart Room service appropriate? Yes; Fluid consistency: Nectar Thick  Consultants:   None  Procedures:   None  Antimicrobials:   Aztreonam  Objective: Vitals:   07/25/16 1840 07/25/16 2045 07/25/16 2150 07/26/16 0646  BP: (!) 127/32  (!) 99/48 (!) 106/46  Pulse: 76  77 81  Resp: 16  16 16   Temp: 98.2 F (36.8 C)  97.4 F (36.3 C) 97.9 F (36.6 C)  TempSrc: Axillary  Axillary Axillary  SpO2: 99% 93% 99% 97%  Weight: 35.1 kg (77 lb 6.4 oz)     Height: 5\' 5"  (1.651 m)       Intake/Output Summary (Last 24 hours) at 07/26/16 0915 Last data filed at 07/26/16 0000  Gross per 24 hour  Intake            462.5 ml  Output                0 ml  Net  462.5 ml   Filed Weights   07/25/16 1131 07/25/16 1840  Weight: 36.3 kg (80 lb) 35.1 kg (77 lb 6.4 oz)    Examination: General exam: Appears calm and comfortable  Respiratory system: Clear to auscultation. Respiratory effort normal. Cardiovascular system: S1 & S2 heard, RRR. No JVD, murmurs, rubs, gallops or clicks. No pedal edema. Gastrointestinal system: Abdomen is nondistended, soft and nontender. No organomegaly or masses felt. Normal bowel sounds heard. Central nervous  system: Alert and oriented. No focal neurological deficits. Extremities: Symmetric 5 x 5 power. Skin: No rashes, lesions or ulcers Psychiatry: Judgement and insight appear normal. Mood & affect appropriate.   Data Reviewed: I have personally reviewed following labs and imaging studies  CBC:  Recent Labs Lab 07/23/16 0500 07/25/16 1224 07/26/16 0659  WBC 6.8 13.3* 7.1  NEUTROABS 5.0 12.3*  --   HGB 14.0 15.2* 12.3  HCT 43.3 46.7* 39.3  MCV 95.2 95.7 95.9  PLT 221 200 123456*   Basic Metabolic Panel:  Recent Labs Lab 07/23/16 0500 07/25/16 1224 07/26/16 0659  NA 148* 152* 151*  K 3.7 3.9 3.1*  CL 116* 117* 125*  CO2 26 27 23   GLUCOSE 103* 128* 99  BUN 47* 49* 41*  CREATININE 1.04* 1.41* 0.97  CALCIUM 9.0 9.5 8.4*   GFR: Estimated Creatinine Clearance: 24.8 mL/min (by C-G formula based on SCr of 0.97 mg/dL). Liver Function Tests:  Recent Labs Lab 07/23/16 0500 07/25/16 1224  AST 22 32  ALT 22 24  ALKPHOS 68 72  BILITOT 0.8 1.1  PROT 5.9* 6.9  ALBUMIN 2.7* 3.0*   No results for input(s): LIPASE, AMYLASE in the last 168 hours. No results for input(s): AMMONIA in the last 168 hours. Coagulation Profile: No results for input(s): INR, PROTIME in the last 168 hours. Cardiac Enzymes: No results for input(s): CKTOTAL, CKMB, CKMBINDEX, TROPONINI in the last 168 hours. BNP (last 3 results) No results for input(s): PROBNP in the last 8760 hours. HbA1C: No results for input(s): HGBA1C in the last 72 hours. CBG: No results for input(s): GLUCAP in the last 168 hours. Lipid Profile: No results for input(s): CHOL, HDL, LDLCALC, TRIG, CHOLHDL, LDLDIRECT in the last 72 hours. Thyroid Function Tests: No results for input(s): TSH, T4TOTAL, FREET4, T3FREE, THYROIDAB in the last 72 hours. Anemia Panel: No results for input(s): VITAMINB12, FOLATE, FERRITIN, TIBC, IRON, RETICCTPCT in the last 72 hours. Urine analysis:    Component Value Date/Time   COLORURINE YELLOW  07/25/2016 1142   APPEARANCEUR HAZY (A) 07/25/2016 1142   LABSPEC 1.020 07/25/2016 1142   PHURINE 7.0 07/25/2016 1142   GLUCOSEU NEGATIVE 07/25/2016 1142   HGBUR LARGE (A) 07/25/2016 1142   BILIRUBINUR NEGATIVE 07/25/2016 1142   KETONESUR NEGATIVE 07/25/2016 1142   PROTEINUR 100 (A) 07/25/2016 1142   UROBILINOGEN 0.2 08/18/2015 0955   NITRITE NEGATIVE 07/25/2016 1142   LEUKOCYTESUR SMALL (A) 07/25/2016 1142   Sepsis Labs: @LABRCNTIP (procalcitonin:4,lacticidven:4)  ) Recent Results (from the past 240 hour(s))  Culture, Urine     Status: Abnormal   Collection Time: 07/22/16  6:20 PM  Result Value Ref Range Status   Specimen Description URINE, CLEAN CATCH  Final   Special Requests NONE  Final   Culture >=100,000 COLONIES/mL PROTEUS MIRABILIS (A)  Final   Report Status 07/26/2016 FINAL  Final   Organism ID, Bacteria PROTEUS MIRABILIS (A)  Final      Susceptibility   Proteus mirabilis - MIC*    AMPICILLIN 8 SENSITIVE Sensitive  CEFAZOLIN 8 SENSITIVE Sensitive     CEFTRIAXONE <=1 SENSITIVE Sensitive     CIPROFLOXACIN 2 INTERMEDIATE Intermediate     GENTAMICIN <=1 SENSITIVE Sensitive     IMIPENEM 2 SENSITIVE Sensitive     NITROFURANTOIN 128 RESISTANT Resistant     TRIMETH/SULFA <=20 SENSITIVE Sensitive     AMPICILLIN/SULBACTAM 4 SENSITIVE Sensitive     PIP/TAZO <=4 SENSITIVE Sensitive     * >=100,000 COLONIES/mL PROTEUS MIRABILIS  Blood Culture (routine x 2)     Status: None (Preliminary result)   Collection Time: 07/25/16 12:24 PM  Result Value Ref Range Status   Specimen Description BLOOD LEFT ARM  Final   Special Requests NONE 8CC EACH  Final   Culture NO GROWTH < 24 HOURS  Final   Report Status PENDING  Incomplete  Blood Culture (routine x 2)     Status: None (Preliminary result)   Collection Time: 07/25/16 12:35 PM  Result Value Ref Range Status   Specimen Description BLOOD RIGHT ARM  Final   Special Requests NONE 8 CC EACH  Final   Culture NO GROWTH < 24 HOURS   Final   Report Status PENDING  Incomplete     Invalid input(s): PROCALCITONIN, LACTICACIDVEN   Radiology Studies: Dg Chest Port 1 View  Result Date: 07/25/2016 CLINICAL DATA:  Failure thrive. EXAM: PORTABLE CHEST 1 VIEW COMPARISON:  10/29/2015 FINDINGS: Mild cardiomegaly. There is hyperinflation of the lungs compatible with COPD. No confluent opacities or effusions. No acute bony abnormality. IMPRESSION: Cardiomegaly, COPD.  No active disease. Electronically Signed   By: Rolm Baptise M.D.   On: 07/25/2016 12:37        Scheduled Meds: . aztreonam  500 mg Intravenous Q8H  . enoxaparin (LOVENOX) injection  30 mg Subcutaneous Q24H  . multivitamin with minerals  1 tablet Oral Daily  . nystatin cream   Topical BID   Continuous Infusions:    LOS: 0 days    Time spent: 35 minutes    Forrest Jaroszewski A, MD [facility] Pager [phone]  If 7PM-7AM, please contact night-coverage [email or web address] Password [email or web address] 07/26/2016, 9:15 AM

## 2016-07-27 LAB — BASIC METABOLIC PANEL
Anion gap: 3 — ABNORMAL LOW (ref 5–15)
BUN: 23 mg/dL — ABNORMAL HIGH (ref 6–20)
CALCIUM: 8.6 mg/dL — AB (ref 8.9–10.3)
CO2: 23 mmol/L (ref 22–32)
CREATININE: 0.91 mg/dL (ref 0.44–1.00)
Chloride: 117 mmol/L — ABNORMAL HIGH (ref 101–111)
GFR calc non Af Amer: 57 mL/min — ABNORMAL LOW (ref >60)
GLUCOSE: 125 mg/dL — AB (ref 65–99)
Potassium: 4.1 mmol/L (ref 3.5–5.1)
Sodium: 143 mmol/L (ref 135–145)

## 2016-07-27 MED ORDER — DEXTROSE 5 % IV SOLN
INTRAVENOUS | Status: AC
  Filled 2016-07-27: qty 1

## 2016-07-27 MED ORDER — POTASSIUM CHLORIDE 2 MEQ/ML IV SOLN
INTRAVENOUS | Status: DC
  Administered 2016-07-27 – 2016-07-28 (×2): via INTRAVENOUS
  Filled 2016-07-27 (×4): qty 1000

## 2016-07-27 NOTE — Progress Notes (Signed)
Pt extremely lethargic and sleepy. Pt unable to communicate or eat. Will continue to monitor patient throughout the shift.

## 2016-07-27 NOTE — Progress Notes (Signed)
PROGRESS NOTE  Kathryn Hamilton  [id number] DOB: 26-Apr-1931 DOA: 07/25/2016 PCP: Chevis Pretty, FNP Outpatient Specialists:  Subjective: Appears comfortable, does not follow commands. Discussed with husband over the phone, will obtain palliative care consult for goals of care.  Brief Narrative:  Kathryn Hamilton is a 82 y.o. female with a history of Alzheimer's disease, colon cancer, osteoporosis. Patient status post internal fixation of left hip in January 2017. She is a resident of the [facility] secondary to Alzheimer's dementia. Over the past 2 days, the patient's had a steady decline in eating, drinking fluid, and swallowing. She's also had diminished interaction with family. Due to patient's mental status, the patient's husband provides the history. She normally eats it is somewhat interactive. Due to the decline, the patient's husband was called to inform him. She was brought to the emergency department for evaluation.   Assessment & Plan:   Active Problems:   Urinary tract infection   FTT (failure to thrive) in adult   Sepsis (HCC)   Encephalopathy   Sacral decubitus ulcer, stage II   Lactic acidosis   Protein-calorie malnutrition, severe   Sepsis - early Presented with WBC of 13.3, heart rate of 102, lactic acid of 3.7 and UTI Started on aggressive IV fluid hydration Source of sepsis appears to be UTI Allergic to penicillin started on aztreonam, recent cultures showed Proteus susceptible to ceftriaxone. We will try Ceftin while she is in the hospital and she is able to take oral meds.  Hypernatremia Sodium of 151, likely secondary to dehydration and poor oral intake. Patient started on IV fluid hydration, check BMP in a.m.  Hypokalemia Potassium of 3.1, replete, check BMP in a.m.  Acute kidney injury Presented with creatinine of 1.4, BUN of 49, baseline creatinine is normal. This is likely secondary to dehydration and poor oral  intake.  Encephalopathy In the setting of Alzheimer's  We'll treat primary problem and look for resolution of encephalopathy.  Urine tract infection Urine cultures started Continue aztreonam Hold vancomycin at this time  Failure to thrive in adult with severe protein energy malnutrition Failure to thrive with severe protein energy malnutrition, continue protein shakes. Had a long discussion with the patient's husband regarding palliative care and hospice, hospice consult. Not able to eat since yesterday  Sacral decubitus ulcer, stage II Consults wound care Some of this appears to be yeast vulvovaginitis. Will treat with nystatin cream for now.  Lactic acidosis Cleared   DVT prophylaxis: Lovenox Code Status: DNR Family Communication:  Disposition Plan:  Diet: DIET - DYS 1 Room service appropriate? Yes; Fluid consistency: Nectar Thick  Consultants:   None  Procedures:   None  Antimicrobials:   Aztreonam  Objective: Vitals:   07/25/16 2150 07/26/16 0646 07/26/16 1347 07/26/16 2230  BP: (!) 99/48 (!) 106/46 (!) 111/52 (!) 124/52  Pulse: 77 81 74 (!) 103  Resp: 16 16 18 18   Temp: 97.4 F (36.3 C) 97.9 F (36.6 C) 97.5 F (36.4 C) 97.9 F (36.6 C)  TempSrc: Axillary Axillary Oral Axillary  SpO2: 99% 97% 98% 98%  Weight:      Height:        Intake/Output Summary (Last 24 hours) at 07/27/16 0948 Last data filed at 07/27/16 0454  Gross per 24 hour  Intake           687.25 ml  Output                0 ml  Net  687.25 ml   Filed Weights   07/25/16 1131 07/25/16 1840  Weight: 36.3 kg (80 lb) 35.1 kg (77 lb 6.4 oz)    Examination: General exam: Appears calm and comfortable  Respiratory system: Clear to auscultation. Respiratory effort normal. Cardiovascular system: S1 & S2 heard, RRR. No JVD, murmurs, rubs, gallops or clicks. No pedal edema. Gastrointestinal system: Abdomen is nondistended, soft and nontender. No organomegaly or masses felt.  Normal bowel sounds heard. Central nervous system: Alert and oriented. No focal neurological deficits. Extremities: Symmetric 5 x 5 power. Skin: No rashes, lesions or ulcers Psychiatry: Judgement and insight appear normal. Mood & affect appropriate.   Data Reviewed: I have personally reviewed following labs and imaging studies  CBC:  Recent Labs Lab 07/23/16 0500 07/25/16 1224 07/26/16 0659  WBC 6.8 13.3* 7.1  NEUTROABS 5.0 12.3*  --   HGB 14.0 15.2* 12.3  HCT 43.3 46.7* 39.3  MCV 95.2 95.7 95.9  PLT 221 200 123456*   Basic Metabolic Panel:  Recent Labs Lab 07/23/16 0500 07/25/16 1224 07/26/16 0659 07/27/16 0653  NA 148* 152* 151* 143  K 3.7 3.9 3.1* 4.1  CL 116* 117* 125* 117*  CO2 26 27 23 23   GLUCOSE 103* 128* 99 125*  BUN 47* 49* 41* 23*  CREATININE 1.04* 1.41* 0.97 0.91  CALCIUM 9.0 9.5 8.4* 8.6*   GFR: Estimated Creatinine Clearance: 26.4 mL/min (by C-G formula based on SCr of 0.91 mg/dL). Liver Function Tests:  Recent Labs Lab 07/23/16 0500 07/25/16 1224  AST 22 32  ALT 22 24  ALKPHOS 68 72  BILITOT 0.8 1.1  PROT 5.9* 6.9  ALBUMIN 2.7* 3.0*   No results for input(s): LIPASE, AMYLASE in the last 168 hours. No results for input(s): AMMONIA in the last 168 hours. Coagulation Profile: No results for input(s): INR, PROTIME in the last 168 hours. Cardiac Enzymes: No results for input(s): CKTOTAL, CKMB, CKMBINDEX, TROPONINI in the last 168 hours. BNP (last 3 results) No results for input(s): PROBNP in the last 8760 hours. HbA1C: No results for input(s): HGBA1C in the last 72 hours. CBG: No results for input(s): GLUCAP in the last 168 hours. Lipid Profile: No results for input(s): CHOL, HDL, LDLCALC, TRIG, CHOLHDL, LDLDIRECT in the last 72 hours. Thyroid Function Tests: No results for input(s): TSH, T4TOTAL, FREET4, T3FREE, THYROIDAB in the last 72 hours. Anemia Panel: No results for input(s): VITAMINB12, FOLATE, FERRITIN, TIBC, IRON, RETICCTPCT in  the last 72 hours. Urine analysis:    Component Value Date/Time   COLORURINE YELLOW 07/25/2016 1142   APPEARANCEUR HAZY (A) 07/25/2016 1142   LABSPEC 1.020 07/25/2016 1142   PHURINE 7.0 07/25/2016 1142   GLUCOSEU NEGATIVE 07/25/2016 1142   HGBUR LARGE (A) 07/25/2016 1142   BILIRUBINUR NEGATIVE 07/25/2016 1142   KETONESUR NEGATIVE 07/25/2016 1142   PROTEINUR 100 (A) 07/25/2016 1142   UROBILINOGEN 0.2 08/18/2015 0955   NITRITE NEGATIVE 07/25/2016 1142   LEUKOCYTESUR SMALL (A) 07/25/2016 1142   Sepsis Labs: @LABRCNTIP (procalcitonin:4,lacticidven:4)  ) Recent Results (from the past 240 hour(s))  Culture, Urine     Status: Abnormal   Collection Time: 07/22/16  6:20 PM  Result Value Ref Range Status   Specimen Description URINE, CLEAN CATCH  Final   Special Requests NONE  Final   Culture >=100,000 COLONIES/mL PROTEUS MIRABILIS (A)  Final   Report Status 07/26/2016 FINAL  Final   Organism ID, Bacteria PROTEUS MIRABILIS (A)  Final      Susceptibility   Proteus mirabilis - MIC*  AMPICILLIN 8 SENSITIVE Sensitive     CEFAZOLIN 8 SENSITIVE Sensitive     CEFTRIAXONE <=1 SENSITIVE Sensitive     CIPROFLOXACIN 2 INTERMEDIATE Intermediate     GENTAMICIN <=1 SENSITIVE Sensitive     IMIPENEM 2 SENSITIVE Sensitive     NITROFURANTOIN 128 RESISTANT Resistant     TRIMETH/SULFA <=20 SENSITIVE Sensitive     AMPICILLIN/SULBACTAM 4 SENSITIVE Sensitive     PIP/TAZO <=4 SENSITIVE Sensitive     * >=100,000 COLONIES/mL PROTEUS MIRABILIS  Blood Culture (routine x 2)     Status: None (Preliminary result)   Collection Time: 07/25/16 12:24 PM  Result Value Ref Range Status   Specimen Description BLOOD LEFT ARM  Final   Special Requests NONE 8CC EACH  Final   Culture NO GROWTH < 24 HOURS  Final   Report Status PENDING  Incomplete  Blood Culture (routine x 2)     Status: None (Preliminary result)   Collection Time: 07/25/16 12:35 PM  Result Value Ref Range Status   Specimen Description BLOOD RIGHT  ARM  Final   Special Requests NONE 8 CC EACH  Final   Culture NO GROWTH < 24 HOURS  Final   Report Status PENDING  Incomplete     Invalid input(s): PROCALCITONIN, LACTICACIDVEN   Radiology Studies: Dg Chest Port 1 View  Result Date: 07/25/2016 CLINICAL DATA:  Failure thrive. EXAM: PORTABLE CHEST 1 VIEW COMPARISON:  10/29/2015 FINDINGS: Mild cardiomegaly. There is hyperinflation of the lungs compatible with COPD. No confluent opacities or effusions. No acute bony abnormality. IMPRESSION: Cardiomegaly, COPD.  No active disease. Electronically Signed   By: Rolm Baptise M.D.   On: 07/25/2016 12:37        Scheduled Meds: . aztreonam  500 mg Intravenous Q8H  . enoxaparin (LOVENOX) injection  30 mg Subcutaneous Q24H  . multivitamin with minerals  1 tablet Oral Daily  . nystatin cream   Topical BID   Continuous Infusions: . dextrose 5 % with kcl 75 mL/hr at 07/27/16 0454     LOS: 1 day    Time spent: 35 minutes    Alahni Varone A, MD [facility] Pager [phone]  If 7PM-7AM, please contact night-coverage [email or web address] Password [email or web address] 07/27/2016, 9:48 AM

## 2016-07-27 NOTE — Significant Event (Signed)
Spoke with husband in the presence of her caregiver at bedside. Patient still out of it, not able to eat. Husband wants her to go to hospice, going to hospice home versus SNF with palliative service. Patient will check the local hospital to a which is better for her end of life, he wants that better care for her.  Husband wants to be called if she is close to end of life, he does not want her to die alone.  Birdie Hopes Pager: [phone] 07/27/2016, 2:59 PM

## 2016-07-28 ENCOUNTER — Encounter (HOSPITAL_COMMUNITY)
Admission: RE | Admit: 2016-07-28 | Discharge: 2016-07-28 | Disposition: A | Discharge status: Home / Self Care | Payer: Medicare Other | Source: Skilled Nursing Facility | Attending: *Deleted | Admitting: *Deleted

## 2016-07-28 LAB — BASIC METABOLIC PANEL
ANION GAP: 5 (ref 5–15)
BUN: 19 mg/dL (ref 6–20)
CO2: 20 mmol/L — ABNORMAL LOW (ref 22–32)
Calcium: 7.9 mg/dL — ABNORMAL LOW (ref 8.9–10.3)
Chloride: 113 mmol/L — ABNORMAL HIGH (ref 101–111)
Creatinine, Ser: 0.7 mg/dL (ref 0.44–1.00)
GFR calc Af Amer: >60 mL/min (ref >60)
GLUCOSE: 119 mg/dL — AB (ref 65–99)
POTASSIUM: 3.1 mmol/L — AB (ref 3.5–5.1)
Sodium: 138 mmol/L (ref 135–145)

## 2016-07-28 MED ORDER — LORAZEPAM 2 MG/ML PO CONC: 0.6000 mg | ORAL | 0 refills | Status: AC | PRN

## 2016-07-28 MED ORDER — MORPHINE SULFATE (CONCENTRATE) 20 MG/ML PO SOLN: 5.0000 mg | ORAL | 0 refills | Status: AC | PRN

## 2016-07-28 NOTE — Consult Note (Signed)
[place] Nurse wound consult note Reason for Consult:Moisture Associated Skin Damage (MASD) from incontinence (IAD).  Present on admission.  Fungal component present to perineum.  Wound type:IAD Pressure Ulcer POA: Yes Measurement:Generalized blanchable erythema to buttocks, sacrum and perineum.  Sacrum with 1 cm x 0.5 cm x 0.1 cm peeling epithelium  Patient grimaces when cleansing skin.  Nonverbal.  Wound [id number] and moist Drainage (amount, consistency, odor) Minimal serous weeping Periwound:Blanchable erythema Dressing procedure/placement/frequency:Cleanse perineal skin with soap and water and pat gently dry.  Apply barrier cream twice daily and PRN incontinence.  No disposable briefs or underpads while in bed.   Turn and reposition every two hours.  Will not follow at this time.  Please re-consult if needed.  Domenic Moras RN BSN [place] Pager [phone]

## 2016-07-28 NOTE — Care Management Important Message (Signed)
Important Message  Patient Details  Name: Kathryn Hamilton MRN: [id number] Date of Birth: 24-Apr-1931   Medicare Important Message Given:  Yes    Cheris Tweten, Chauncey [place], RN 07/28/2016, 10:12 AM

## 2016-07-28 NOTE — Progress Notes (Signed)
Pt discharged to Hospice home. Called Report to [employer]. Patient leaving with all belongings. Family aware. IV catheter D/C.  Rn will continue to monitor Oswald Hillock, RN

## 2016-07-28 NOTE — Care Management Note (Signed)
Case Management Note  Patient Details  Name: Kathryn Hamilton MRN: [id number] Date of Birth: 1931-01-30    Expected Discharge Date:  08/04/16               Expected Discharge Plan:  [place]  In-House Referral:  Clinical Social Work, Hospice / Palliative Care  Discharge planning Services  CM Consult  Post Acute Care Choice:    Choice offered to:  Spouse  DME Arranged:    DME Agency:     HH Arranged:    [place] Agency:     Status of Service:  Completed, signed off  If discussed at [employer] of Stay Meetings, dates discussed:    Additional Comments: Patient referral faxed to Hospice facility by [facility] per husband request. Will follow. Ahava Kissoon, Chauncey [place], RN 07/28/2016, 10:11 AM

## 2016-07-28 NOTE — Progress Notes (Signed)
Husband contacted to inform him PT seems to be doing worse. Husband to report to the hospital

## 2016-07-28 NOTE — Clinical Social Work Note (Addendum)
Clinical Social Work Assessment  Patient Details  Name: Kathryn Hamilton MRN: [id number] Date of Birth: May 01, 1931  Date of referral:  07/28/16               Reason for consult:  Discharge Planning                Permission sought to share information with:    Permission granted to share information::     Name::        Agency::     Relationship::     Contact Information:     Housing/Transportation Living arrangements for the past 2 months:  [place] of Information:  Spouse Patient Interpreter Needed:  None Criminal Activity/Legal Involvement Pertinent to Current Situation/Hospitalization:  No - Comment as needed Significant Relationships:  Spouse Lives with:  Facility Resident Do you feel safe going back to the place where you live?  Yes Need for family participation in patient care:  Yes (Comment)  Care giving concerns:  Pt is end of life care.    Social Worker assessment / plan:  CSW discussed pt with MD who reports he has spoken with pt's husband regarding residential hospice and this is direction husband requests. CSW spoke with pt's husband who reports pt has been a resident at [facility] for about a year. She has Alzheimer's and has declined recently, significantly in the past few days. CSW briefly discussed hospice services and he states he is aware of these already. Bed is available at [facility] today and pt will transfer via [place]. Husband plans to go by facility this morning to sign paperwork. CSW notified [facility] with husband's permission.  Employment status:  Retired Forensic scientist:  Medicare PT Recommendations:  Not assessed at this time [place] / Referral to community resources:  Other (Comment Required) (Residential Hospice)  Patient/Family's Response to care:  [name] feels that [facility] is most appropriate at this time.   Patient/Family's Understanding of and Emotional Response to Diagnosis,  Current Treatment, and Prognosis:  [name] is aware of poor prognosis and agrees with transfer to residential hospice. Support provided.   Emotional Assessment Appearance:  Appears stated age Attitude/Demeanor/Rapport:  Unable to Assess Affect (typically observed):  Unable to Assess Orientation:    Alcohol / Substance use:  Not Applicable Psych involvement (Current and /or in the community):  No (Comment)  Discharge Needs  Concerns to be addressed:  Discharge Planning Concerns Readmission within the last 30 days:  No Current discharge risk:  Terminally ill Barriers to Discharge:  No Barriers Identified   Salome Arnt, [place] 07/28/2016, 10:32 AM [phone]

## 2016-07-28 NOTE — Discharge Summary (Signed)
Physician Discharge Summary  Kathryn Hamilton VOJ:[id number] DOB: June 12, 1931 DOA: 07/25/2016  PCP: Chevis Pretty, FNP  Admit date: 07/25/2016 Discharge date: 07/28/2016  Admitted From: Home Disposition:  Residential Hospice Home  Recommendations for Outpatient Follow-up:  1. Follow up with Hospice home staff  Home Health: NA Equipment/Devices: NA  Discharge Condition: Stable CODE STATUS: DNR, Hospice Diet recommendation: Heart Healthy  Brief/Interim Summary: Kathryn Hamilton is a 82 y.o. female with a history of Alzheimer's disease, colon cancer, osteoporosis. Patient status post internal fixation of left hip in January 2017. She is a resident of the [facility] secondary to Alzheimer's dementia. Over the past 2 days, the patient's had a steady decline in eating, drinking fluid, and swallowing. She's also had diminished interaction with family. Due to patient's mental status, the patient's husband provides the history. She normally eats it is somewhat interactive. Due to the decline, the patient's husband was called to inform him. She was brought to the emergency department for evaluation.  Discharge Diagnoses:  Active Problems:   Urinary tract infection   FTT (failure to thrive) in adult   Sepsis (HCC)   Encephalopathy   Sacral decubitus ulcer, stage II   Lactic acidosis   Protein-calorie malnutrition, severe   Sepsis - early Presented with WBC of 13.3, heart rate of 102, lactic acid of 3.7 and UTI Started on aggressive IV fluid hydration Source of sepsis appears to be UTI Allergic to penicillin started on aztreonam, recent cultures showed Proteus susceptible to ceftriaxone. No Antibiotics on discharge  Hypernatremia Sodium of 151, likely secondary to dehydration and poor oral intake. This is corrected with IV fluids.  Hypokalemia Potassium of 3.1, repleted.  Acute kidney injury Presented with creatinine of 1.4, BUN of 49, baseline creatinine is  normal. This is likely secondary to dehydration and poor oral intake.  Encephalopathy In the setting of Alzheimer's  We'll treat primary problem and look for resolution of encephalopathy.  Urine tract infection Urine cultures started Continue aztreonam Hold vancomycin at this time  Failure to thrive in adult with severe protein energy malnutrition Failure to thrive with severe protein energy malnutrition. Had a long discussion with the patient's husband regarding palliative care and hospice, hospice consult. Not able to eat since Thursday morning.  Sacral decubitus ulcer, stage II Consults wound care Lactic acidosis Cleared  End of life issues Patient is DNR/DNI, met with her husband Mr. Kathryn Hamilton and wants patient to be full comfort. Discontinued all antibiotics and IV fluids on discharge. Patient discharged to a local hospice home for further care.    Discharge Instructions  Discharge Instructions    Diet - low sodium heart healthy    Complete by:  As directed    Increase activity slowly    Complete by:  As directed        Medication List    TAKE these medications   feeding supplement (ENSURE ENLIVE) Liqd Take 237 mLs by mouth 2 (two) times daily between meals.   FLEET ENEMA RE Place 1 Dose rectally once.   LORazepam 2 MG/ML concentrated solution Commonly known as:  ATIVAN Take 0.3 mLs (0.6 mg total) by mouth every 4 (four) hours as needed for anxiety. What changed:  how much to take   morphine 20 MG/ML concentrated solution Commonly known as:  ROXANOL Take 0.25 mLs (5 mg total) by mouth every 4 (four) hours as needed for severe pain.       Allergies  Allergen Reactions  . Penicillins Swelling  Tongue swells (angioedema)    Consultations:  None   Procedures/Studies: Dg Chest Port 1 View  Result Date: 07/25/2016 CLINICAL DATA:  Failure thrive. EXAM: PORTABLE CHEST 1 VIEW COMPARISON:  10/29/2015 FINDINGS: Mild cardiomegaly. There is  hyperinflation of the lungs compatible with COPD. No confluent opacities or effusions. No acute bony abnormality. IMPRESSION: Cardiomegaly, COPD.  No active disease. Electronically Signed   By: Rolm Baptise M.D.   On: 07/25/2016 12:37    (Echo, Carotid, EGD, Colonoscopy, ERCP)    Subjective:   Discharge Exam: Vitals:   07/28/16 0402 07/28/16 0604  BP: (!) 121/43 (!) 116/49  Pulse: (!) 106 (!) 105  Resp: (!) 22 20  Temp: 99.2 F (37.3 C) 98.1 F (36.7 C)   Vitals:   07/27/16 1300 07/27/16 2145 07/28/16 0402 07/28/16 0604  BP: (!) 105/41 (!) 106/53 (!) 121/43 (!) 116/49  Pulse: 78 (!) 107 (!) 106 (!) 105  Resp: 20 18 (!) 22 20  Temp: 97.1 F (36.2 C) 99.6 F (37.6 C) 99.2 F (37.3 C) 98.1 F (36.7 C)  TempSrc: Axillary Oral Oral Oral  SpO2: 97% 94% 96% 95%  Weight:      Height:        General: Pt is alert, awake, not in acute distress Cardiovascular: RRR, S1/S2 +, no rubs, no gallops Respiratory: CTA bilaterally, no wheezing, no rhonchi Abdominal: Soft, NT, ND, bowel sounds + Extremities: no edema, no cyanosis    The results of significant diagnostics from this hospitalization (including imaging, microbiology, ancillary and laboratory) are listed below for reference.     Microbiology: Recent Results (from the past 240 hour(s))  Culture, Urine     Status: Abnormal   Collection Time: 07/22/16  6:20 PM  Result Value Ref Range Status   Specimen Description URINE, CLEAN CATCH  Final   Special Requests NONE  Final   Culture >=100,000 COLONIES/mL PROTEUS MIRABILIS (A)  Final   Report Status 07/26/2016 FINAL  Final   Organism ID, Bacteria PROTEUS MIRABILIS (A)  Final      Susceptibility   Proteus mirabilis - MIC*    AMPICILLIN 8 SENSITIVE Sensitive     CEFAZOLIN 8 SENSITIVE Sensitive     CEFTRIAXONE <=1 SENSITIVE Sensitive     CIPROFLOXACIN 2 INTERMEDIATE Intermediate     GENTAMICIN <=1 SENSITIVE Sensitive     IMIPENEM 2 SENSITIVE Sensitive     NITROFURANTOIN 128  RESISTANT Resistant     TRIMETH/SULFA <=20 SENSITIVE Sensitive     AMPICILLIN/SULBACTAM 4 SENSITIVE Sensitive     PIP/TAZO <=4 SENSITIVE Sensitive     * >=100,000 COLONIES/mL PROTEUS MIRABILIS  Blood Culture (routine x 2)     Status: None (Preliminary result)   Collection Time: 07/25/16 12:24 PM  Result Value Ref Range Status   Specimen Description BLOOD LEFT ARM  Final   Special Requests NONE 8CC EACH  Final   Culture NO GROWTH 3 DAYS  Final   Report Status PENDING  Incomplete  Blood Culture (routine x 2)     Status: None (Preliminary result)   Collection Time: 07/25/16 12:35 PM  Result Value Ref Range Status   Specimen Description BLOOD RIGHT ARM  Final   Special Requests NONE 8 CC EACH  Final   Culture NO GROWTH 3 DAYS  Final   Report Status PENDING  Incomplete     Labs: BNP (last 3 results) No results for input(s): BNP in the last 8760 hours. Basic Metabolic Panel:  Recent Labs Lab 07/23/16 0500  07/25/16 1224 07/26/16 0659 07/27/16 0653 07/28/16 0444  NA 148* 152* 151* 143 138  K 3.7 3.9 3.1* 4.1 3.1*  CL 116* 117* 125* 117* 113*  CO2 '26 27 23 23 '$ 20*  GLUCOSE 103* 128* 99 125* 119*  BUN 47* 49* 41* 23* 19  CREATININE 1.04* 1.41* 0.97 0.91 0.70  CALCIUM 9.0 9.5 8.4* 8.6* 7.9*   Liver Function Tests:  Recent Labs Lab 07/23/16 0500 07/25/16 1224  AST 22 32  ALT 22 24  ALKPHOS 68 72  BILITOT 0.8 1.1  PROT 5.9* 6.9  ALBUMIN 2.7* 3.0*   No results for input(s): LIPASE, AMYLASE in the last 168 hours. No results for input(s): AMMONIA in the last 168 hours. CBC:  Recent Labs Lab 07/23/16 0500 07/25/16 1224 07/26/16 0659  WBC 6.8 13.3* 7.1  NEUTROABS 5.0 12.3*  --   HGB 14.0 15.2* 12.3  HCT 43.3 46.7* 39.3  MCV 95.2 95.7 95.9  PLT 221 200 146*   Cardiac Enzymes: No results for input(s): CKTOTAL, CKMB, CKMBINDEX, TROPONINI in the last 168 hours. BNP: Invalid input(s): POCBNP CBG: No results for input(s): GLUCAP in the last 168 hours. D-Dimer No  results for input(s): DDIMER in the last 72 hours. Hgb A1c No results for input(s): HGBA1C in the last 72 hours. Lipid Profile No results for input(s): CHOL, HDL, LDLCALC, TRIG, CHOLHDL, LDLDIRECT in the last 72 hours. Thyroid function studies No results for input(s): TSH, T4TOTAL, T3FREE, THYROIDAB in the last 72 hours.  Invalid input(s): FREET3 Anemia work up No results for input(s): VITAMINB12, FOLATE, FERRITIN, TIBC, IRON, RETICCTPCT in the last 72 hours. Urinalysis    Component Value Date/Time   COLORURINE YELLOW 07/25/2016 1142   APPEARANCEUR HAZY (A) 07/25/2016 1142   LABSPEC 1.020 07/25/2016 1142   PHURINE 7.0 07/25/2016 1142   GLUCOSEU NEGATIVE 07/25/2016 1142   HGBUR LARGE (A) 07/25/2016 1142   BILIRUBINUR NEGATIVE 07/25/2016 1142   KETONESUR NEGATIVE 07/25/2016 1142   PROTEINUR 100 (A) 07/25/2016 1142   UROBILINOGEN 0.2 08/18/2015 0955   NITRITE NEGATIVE 07/25/2016 1142   LEUKOCYTESUR SMALL (A) 07/25/2016 1142   Sepsis Labs Invalid input(s): PROCALCITONIN,  WBC,  LACTICIDVEN Microbiology Recent Results (from the past 240 hour(s))  Culture, Urine     Status: Abnormal   Collection Time: 07/22/16  6:20 PM  Result Value Ref Range Status   Specimen Description URINE, CLEAN CATCH  Final   Special Requests NONE  Final   Culture >=100,000 COLONIES/mL PROTEUS MIRABILIS (A)  Final   Report Status 07/26/2016 FINAL  Final   Organism ID, Bacteria PROTEUS MIRABILIS (A)  Final      Susceptibility   Proteus mirabilis - MIC*    AMPICILLIN 8 SENSITIVE Sensitive     CEFAZOLIN 8 SENSITIVE Sensitive     CEFTRIAXONE <=1 SENSITIVE Sensitive     CIPROFLOXACIN 2 INTERMEDIATE Intermediate     GENTAMICIN <=1 SENSITIVE Sensitive     IMIPENEM 2 SENSITIVE Sensitive     NITROFURANTOIN 128 RESISTANT Resistant     TRIMETH/SULFA <=20 SENSITIVE Sensitive     AMPICILLIN/SULBACTAM 4 SENSITIVE Sensitive     PIP/TAZO <=4 SENSITIVE Sensitive     * >=100,000 COLONIES/mL PROTEUS MIRABILIS  Blood  Culture (routine x 2)     Status: None (Preliminary result)   Collection Time: 07/25/16 12:24 PM  Result Value Ref Range Status   Specimen Description BLOOD LEFT ARM  Final   Special Requests NONE 8CC EACH  Final   Culture NO GROWTH 3 DAYS  Final   Report Status PENDING  Incomplete  Blood Culture (routine x 2)     Status: None (Preliminary result)   Collection Time: 07/25/16 12:35 PM  Result Value Ref Range Status   Specimen Description BLOOD RIGHT ARM  Final   Special Requests NONE 8 CC EACH  Final   Culture NO GROWTH 3 DAYS  Final   Report Status PENDING  Incomplete     Time coordinating discharge: Over 30 minutes  SIGNED:   Birdie Hopes, MD  [facility] 07/28/2016, 10:51 AM Pager   If 7PM-7AM, please contact night-coverage [email or web address] Password [email or web address]

## 2016-07-28 NOTE — Progress Notes (Signed)
Palliative Medicine order noted. Palliative provider is out of town for conference and will on 08/04/16. If recommendations are needed in the interim, please call PMT at [facility] at [phone].  Marjie Skiff Marshea Wisher, RN, BSN, [facility] 07/28/2016 9:24 AM Cell [phone] 8:00-4:00 Monday-Friday Office [phone]

## 2016-07-28 NOTE — Clinical Social Work Note (Signed)
Patient Information   Patient Name Caisha, Kufahl ([id number]) Sex Female DOB [birth year]  Room Bed  A329 A329-01  Patient Demographics   Address [place] [address] Phone [phone] (Home) *[phone] (Mobile) E-mail Address [email or web address]  Patient Ethnicity & Race   Ethnic Group Patient Race  Not Hispanic or Latino White or Caucasian  Emergency Contact(s)   Name Relation Home Work Mobile  Karch,Stonewall Lucretia Field [phone]  [phone]  Documents on File    Status Date Received Description  Documents for the Patient  [place] E-Signature HIPAA Notice of Privacy Received 11/13/13   [place] E-Signature HIPAA Notice of Privacy Spanish Received 01/07/13   [place] Not Received    Driver's License Not Received    Insurance Card Not Received    Advance Directives/Living Will/HCPOA/POA Not Received  [facility]/2016  Advanced Beneficiary Notice (ABN) Not Received    Driver's License Received [id number] [facility]  Insurance Card Received 01/31/13 [facility]  Release of Information Received 01/31/13 [facility]  Insurance Card Not Received    E-Signature AOB Spanish Not Received    Advance Directives/Living Will/HCPOA/POA Received 10/26/14 [facility]/LAT  Other Photo ID Not Received    HIM ROI Authorization  03/29/15   Advance Directives/Living Will/HCPOA/POA  04/04/15   Release of Information  04/04/15   Release of Information  05/09/15   Advance Directives/Living Will/HCPOA/POA Received 03/05/16 DNR Form  Documents for the Encounter  AOB (Assignment of Insurance Benefits) Received 07/25/16 pt unable to sign due to her condition  E-signature AOB     MEDICARE RIGHTS Received 07/25/16 pt unable to sign due to her condition  E-signature Medicare Rights     ED Patient Billing Extract   ED PB Summary  ED Patient Billing Extract   ED Encounter Summary  Admission Information   Attending Provider Admitting Provider  Admission Type Admission Date/Time  Verlee [place], MD Truett Mainland, DO Emergency 07/25/16 1136  Discharge Date Hospital Service Auth/Cert Status Service Area   Internal Medicine Incomplete Jud  Unit Room/Bed Admission Status   AP-DEPT 300 A329/A329-01 Admission (Confirmed)   Admission   Complaint  failure to thrive  Hospital Account   Name Acct ID Class Status Primary Coverage  Khamila, Brydon [id number] Inpatient Open MEDICARE - MEDICARE PART A AND B      Guarantor Account (for Hospital Account [id number])   Name Relation to Pt Service Area Active? Acct Type  Herbert Deaner Self CHSA Yes Personal/Family  Address Phone    [place], [place] [address] [phone])        Coverage Information (for Hospital Account [id number])   1. [place] PART A AND B   F/O Payor/Plan Precert #  MEDICARE/MEDICARE PART A AND B   Subscriber Subscriber #  Giana, Klocko DO:[id number]  Address Phone  [address] [place], [place] [address]   2. BLUE CROSS BLUE SHIELD/BCBS SUPPLEMENT   F/O Payor/Plan Precert #  BLUE CROSS BLUE SHIELD/BCBS SUPPLEMENT   Subscriber Subscriber #  Nohealani, Laity [id number]  Address Phone  [address] [place], [place] [address] [phone]

## 2016-07-30 ENCOUNTER — Telehealth: Payer: Self-pay | Admitting: Nurse Practitioner

## 2016-07-30 LAB — CULTURE, BLOOD (ROUTINE X 2)
CULTURE: NO GROWTH
Culture: NO GROWTH

## 2016-07-31 NOTE — Telephone Encounter (Signed)
Spoke with husband

## 2016-08-13 DEATH — deceased

## 2017-04-18 IMAGING — DX DG HIP (WITH OR WITHOUT PELVIS) 2-3V*L*
3 series · 3 of 3 positions shown · non-contrast
Comparison: 07/16/2015.

CLINICAL DATA: Left hip surgery.  Pain.

EXAM:
DG HIP (WITH OR WITHOUT PELVIS) 2-3V LEFT

[pelvis ap]
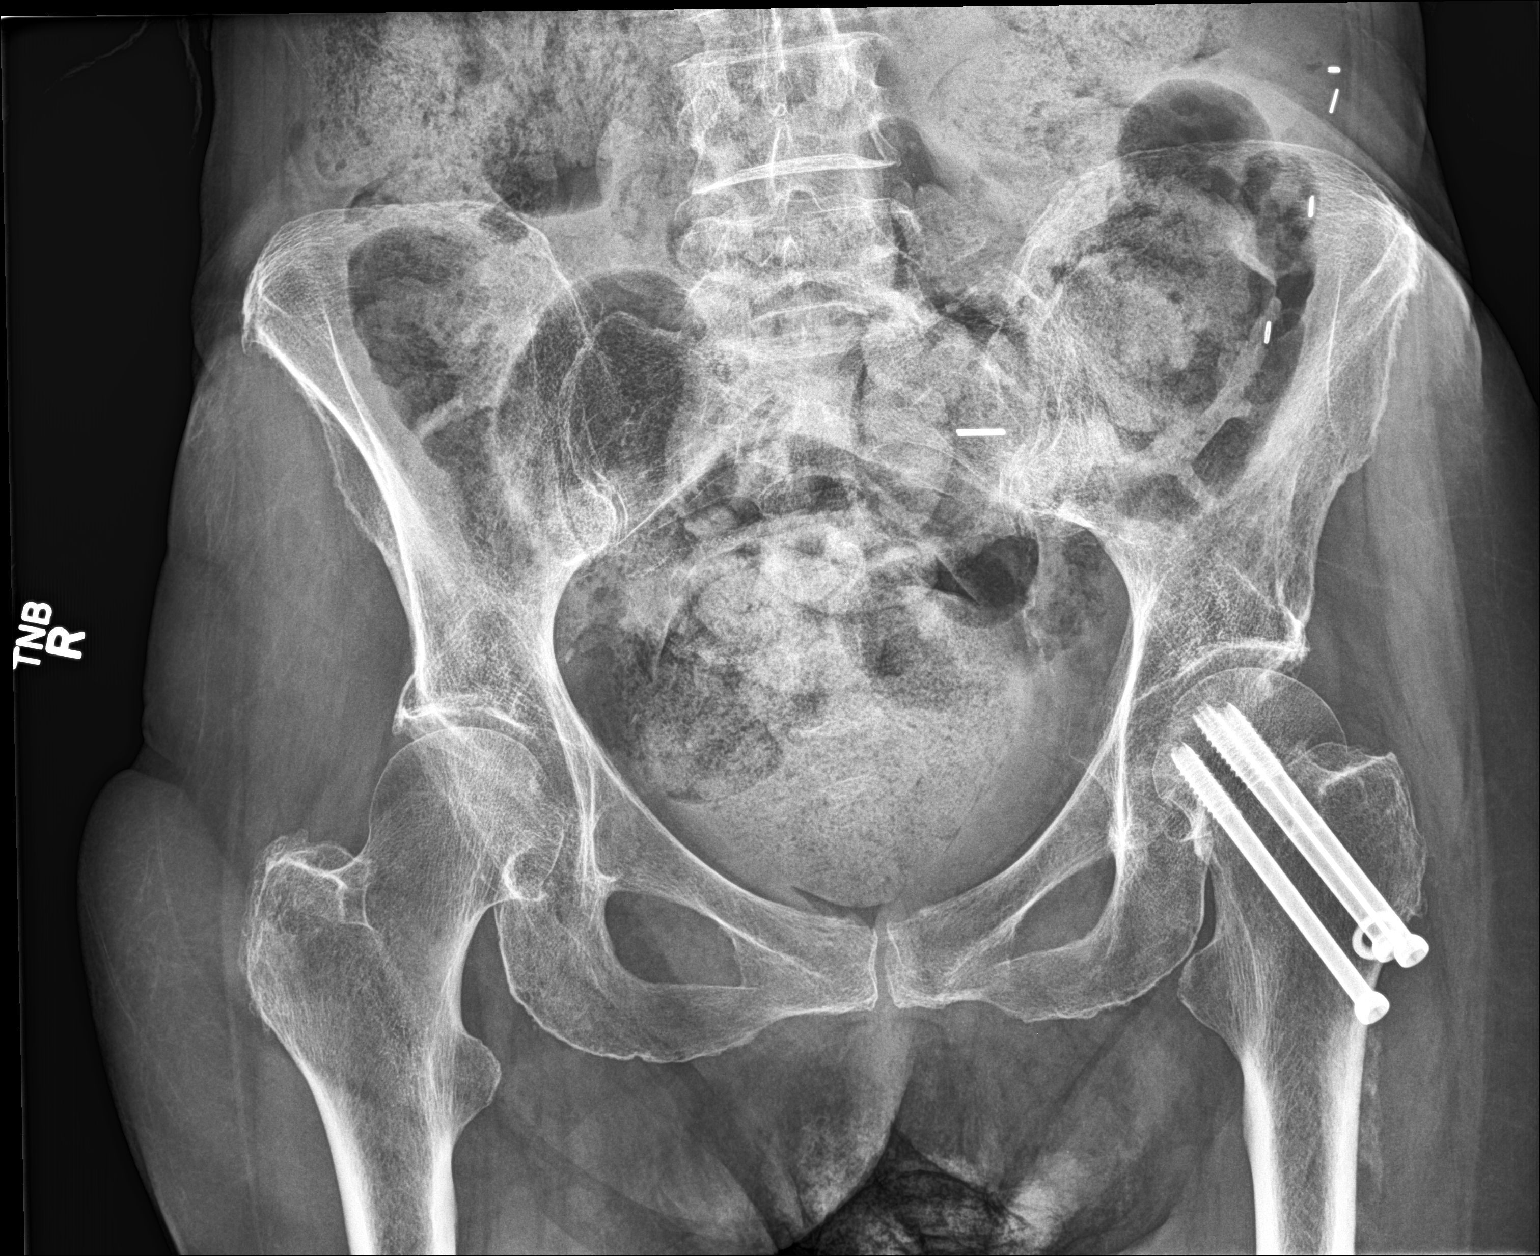

[hip ap]
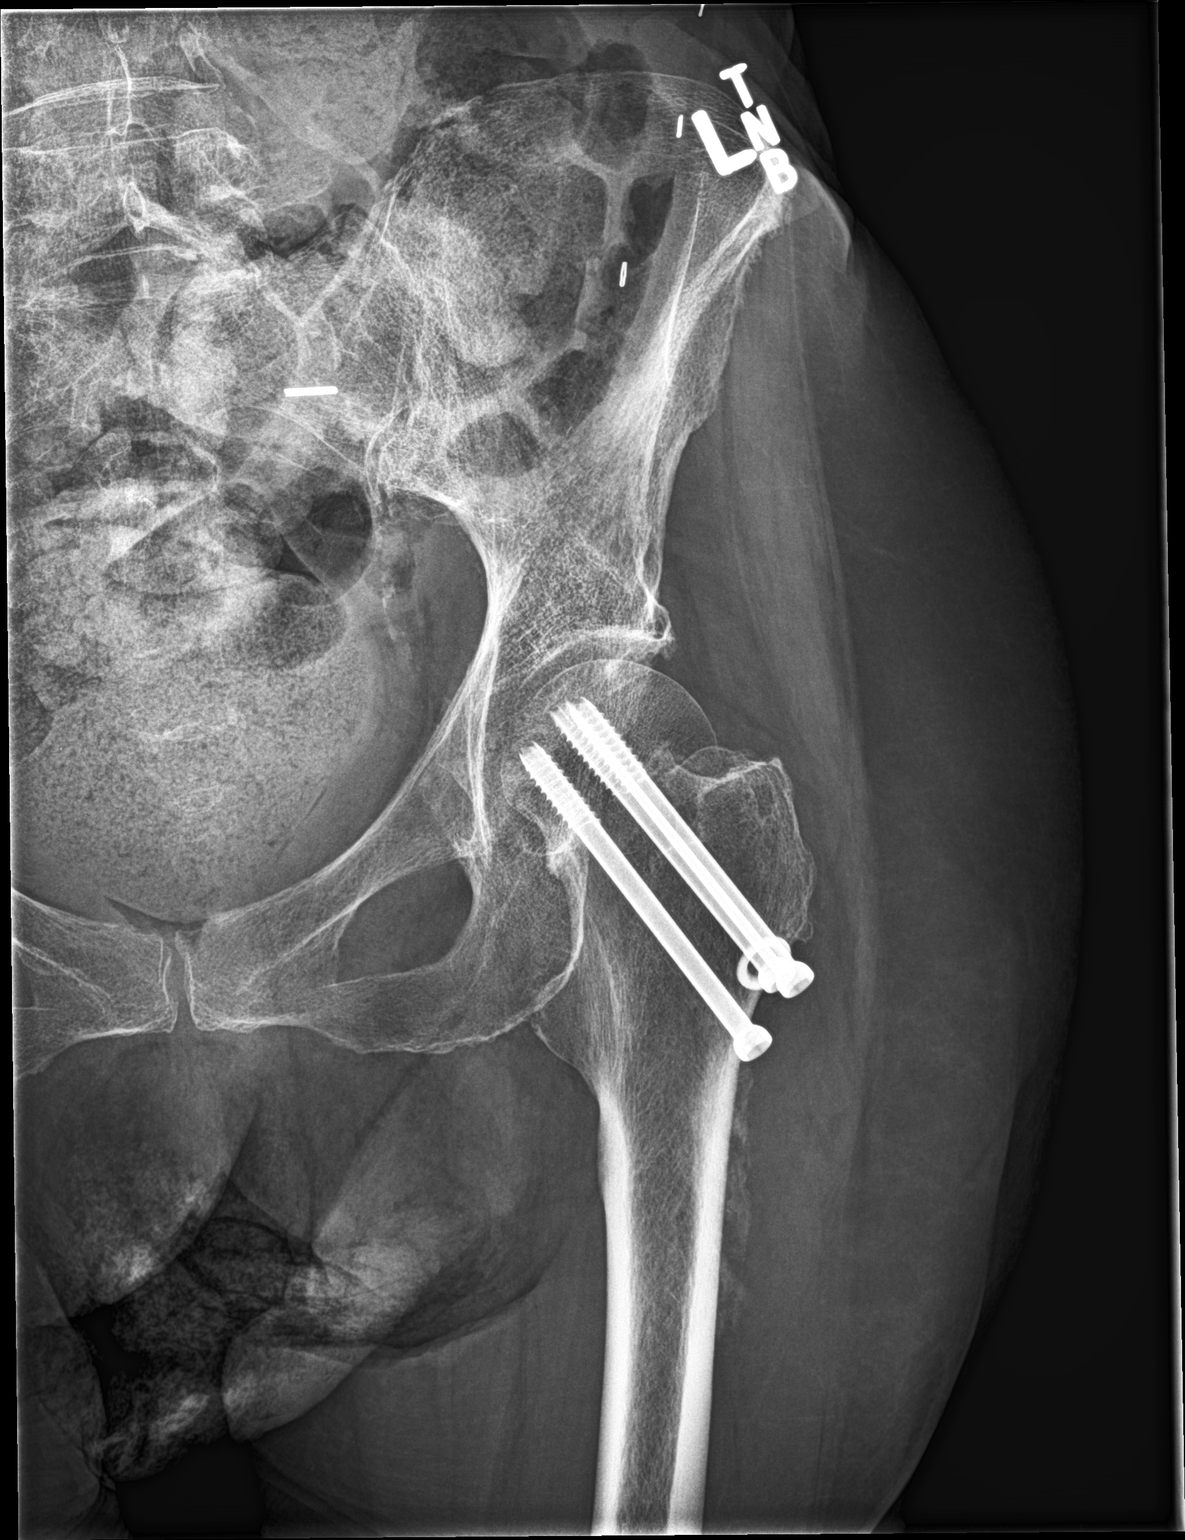

[hip frog leg]
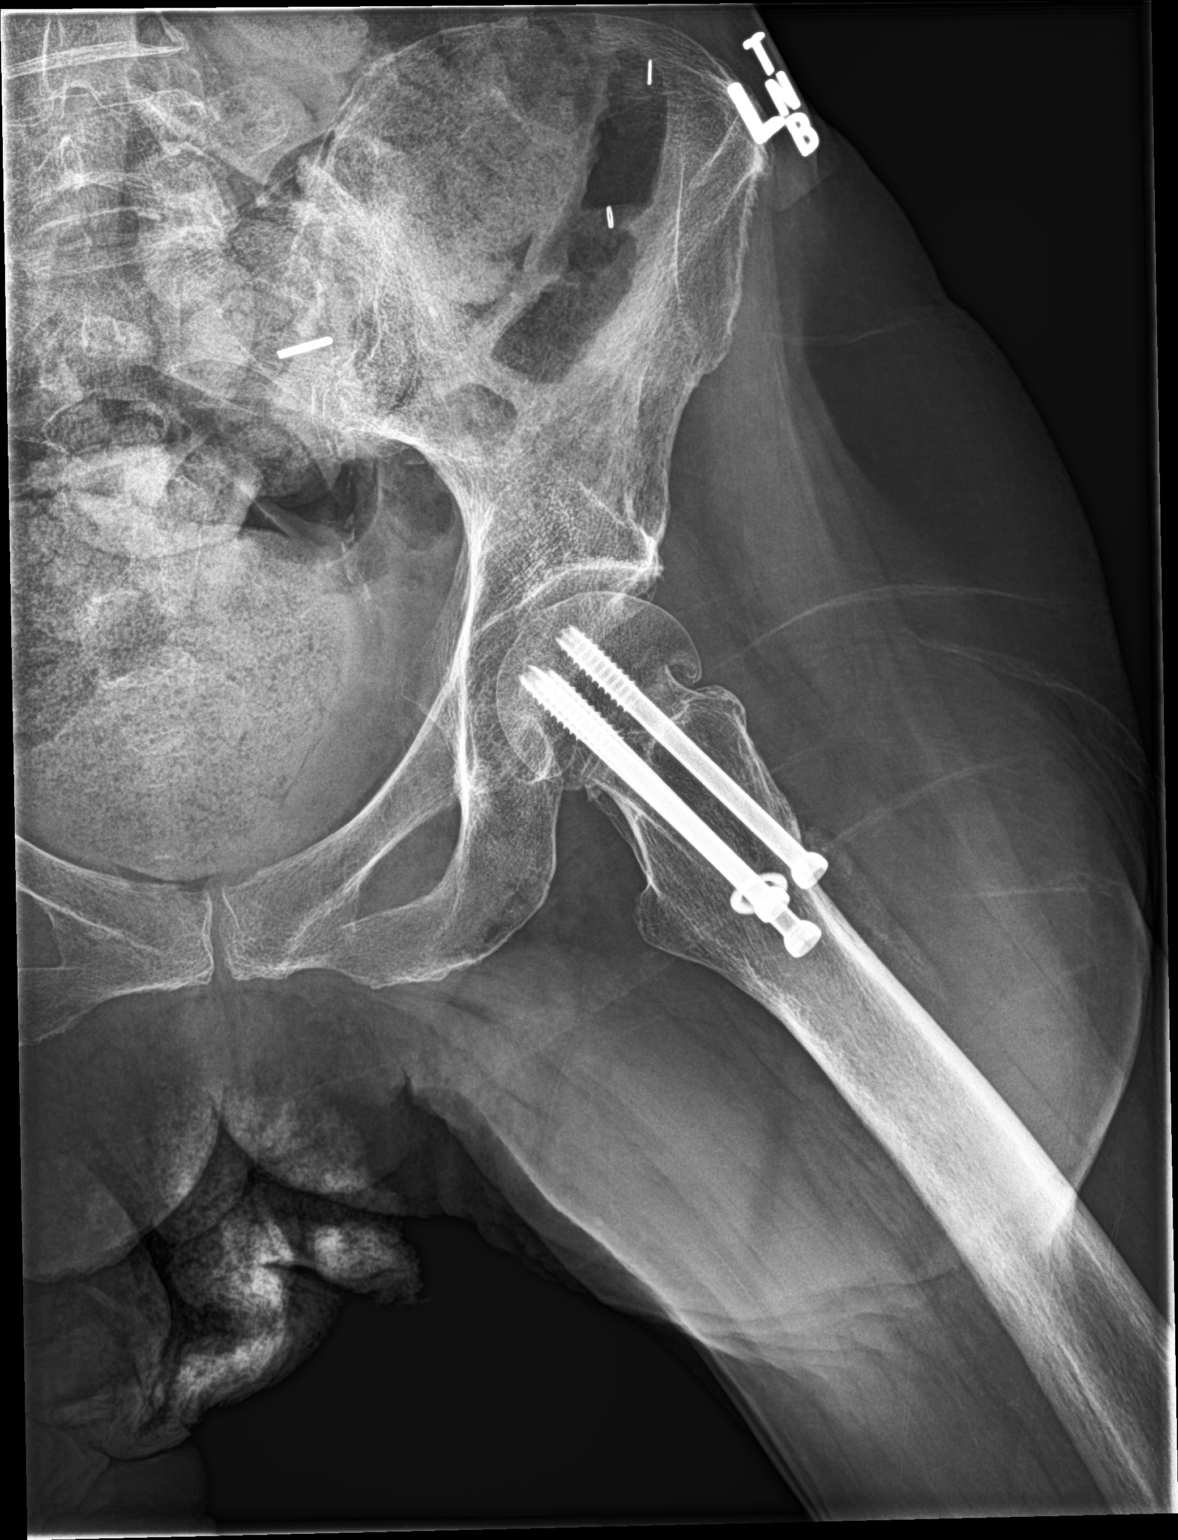

[3 of 3 positions shown; findings below may reference images not displayed]

FINDINGS: Degenerative changes lumbar spine and both hips. Diffuse osteopenia.
No acute bony abnormality. Surgical screws left hip. Hardware
intact. No acute bony abnormality. Pelvic clips noted. Large amount
of stool noted throughout the colon including rectosigmoid. These
findings suggest constipation with possible fecal impaction.
IMPRESSION: 1. Surgical screws left hip. Hardware intact. No acute abnormality.
2. Large amount of stool noted throughout the colon consistent with
constipation. Fecal impaction cannot be excluded .

## 2018-03-29 IMAGING — CR DG CHEST 1V PORT
1 series · 1 of 1 positions shown · non-contrast
Comparison: 10/29/2015

CLINICAL DATA: Failure thrive.

EXAM:
PORTABLE CHEST 1 VIEW

[portable]
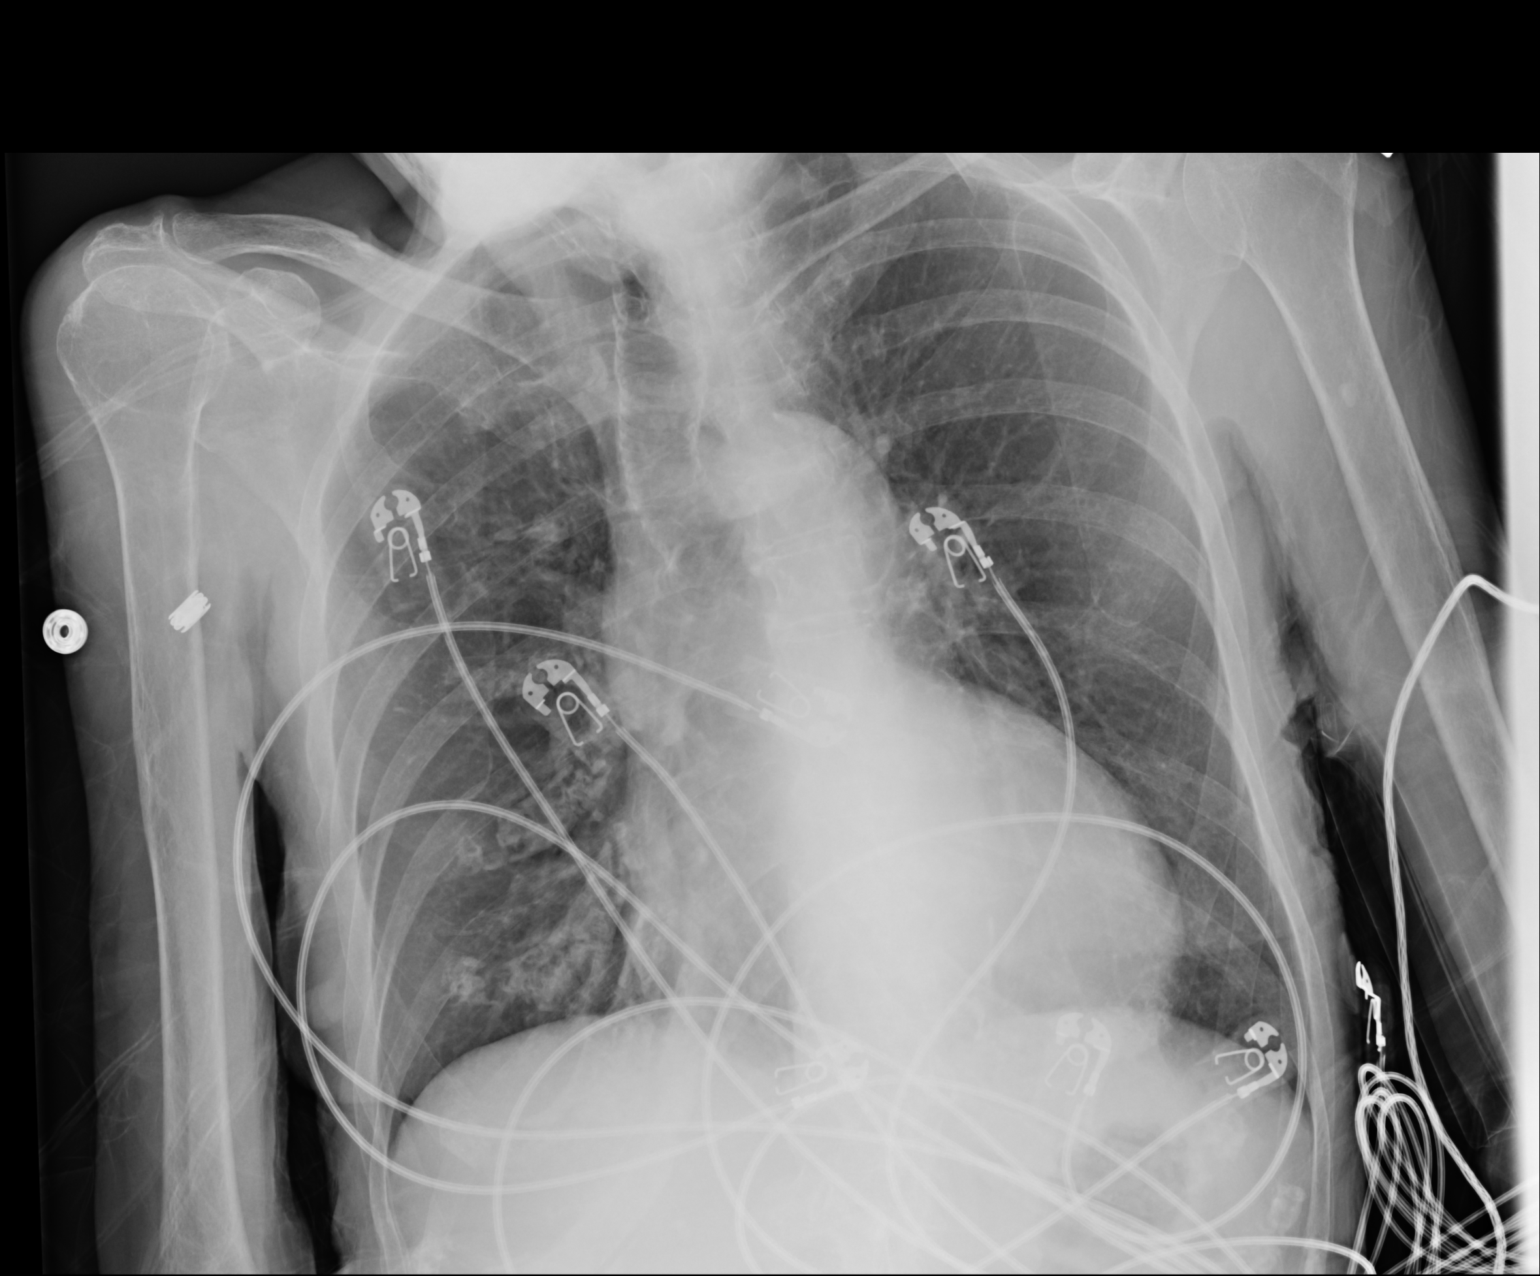

[1 of 1 positions shown; findings below may reference images not displayed]

FINDINGS: Mild cardiomegaly. There is hyperinflation of the lungs compatible
with COPD. No confluent opacities or effusions. No acute bony
abnormality.
IMPRESSION: Cardiomegaly, COPD.  No active disease.
